# Patient Record
Sex: Male | Born: 1935 | Race: White | Hispanic: No | Marital: Married | Smoking: Former smoker
Health system: Southern US, Community
[De-identification: ages and names within clinical notes are randomized; demographics above are authoritative.]

## PROBLEM LIST (undated history)

## (undated) DIAGNOSIS — E785 Hyperlipidemia, unspecified: Secondary | ICD-10-CM

## (undated) DIAGNOSIS — J439 Emphysema, unspecified: Secondary | ICD-10-CM

## (undated) DIAGNOSIS — I7090 Unspecified atherosclerosis: Secondary | ICD-10-CM

## (undated) DIAGNOSIS — K8020 Calculus of gallbladder without cholecystitis without obstruction: Secondary | ICD-10-CM

## (undated) DIAGNOSIS — I714 Abdominal aortic aneurysm, without rupture, unspecified: Secondary | ICD-10-CM

## (undated) DIAGNOSIS — I219 Acute myocardial infarction, unspecified: Secondary | ICD-10-CM

## (undated) DIAGNOSIS — I10 Essential (primary) hypertension: Secondary | ICD-10-CM

## (undated) DIAGNOSIS — K5909 Other constipation: Secondary | ICD-10-CM

## (undated) DIAGNOSIS — I2510 Atherosclerotic heart disease of native coronary artery without angina pectoris: Secondary | ICD-10-CM

## (undated) DIAGNOSIS — E039 Hypothyroidism, unspecified: Secondary | ICD-10-CM

## (undated) DIAGNOSIS — J189 Pneumonia, unspecified organism: Secondary | ICD-10-CM

## (undated) DIAGNOSIS — N210 Calculus in bladder: Secondary | ICD-10-CM

## (undated) HISTORY — PX: TONSILLECTOMY: SUR1361

## (undated) HISTORY — DX: Abdominal aortic aneurysm, without rupture: I71.4

## (undated) HISTORY — DX: Pneumonia, unspecified organism: J18.9

## (undated) HISTORY — DX: Hyperlipidemia, unspecified: E78.5

## (undated) HISTORY — DX: Acute myocardial infarction, unspecified: I21.9

## (undated) HISTORY — DX: Abdominal aortic aneurysm, without rupture, unspecified: I71.40

## (undated) HISTORY — DX: Unspecified atherosclerosis: I70.90

## (undated) HISTORY — DX: Hypothyroidism, unspecified: E03.9

## (undated) HISTORY — DX: Essential (primary) hypertension: I10

## (undated) HISTORY — DX: Calculus in bladder: N21.0

## (undated) HISTORY — DX: Atherosclerotic heart disease of native coronary artery without angina pectoris: I25.10

## (undated) HISTORY — PX: APPENDECTOMY: SHX54

## (undated) HISTORY — DX: Other constipation: K59.09

## (undated) HISTORY — DX: Calculus of gallbladder without cholecystitis without obstruction: K80.20

---

## 1984-02-01 HISTORY — PX: CORONARY ARTERY BYPASS GRAFT: SHX141

## 2006-10-12 ENCOUNTER — Ambulatory Visit (HOSPITAL_COMMUNITY): Admission: RE | Admit: 2006-10-12 | Discharge: 2006-10-12 | Payer: Self-pay | Admitting: Ophthalmology

## 2007-01-04 ENCOUNTER — Ambulatory Visit (HOSPITAL_COMMUNITY): Admission: RE | Admit: 2007-01-04 | Discharge: 2007-01-04 | Payer: Self-pay | Admitting: Ophthalmology

## 2010-01-31 DIAGNOSIS — J189 Pneumonia, unspecified organism: Secondary | ICD-10-CM

## 2010-01-31 HISTORY — DX: Pneumonia, unspecified organism: J18.9

## 2010-07-27 LAB — BASIC METABOLIC PANEL WITH GFR
Creatinine: 1 mg/dL (ref 0.6–1.3)
Glucose: 104 mg/dL
Potassium: 4.5 mmol/L (ref 3.4–5.3)
Sodium: 140 mmol/L (ref 137–147)

## 2010-07-27 LAB — HEPATIC FUNCTION PANEL: ALT: 14 U/L (ref 10–40)

## 2010-08-31 ENCOUNTER — Encounter (INDEPENDENT_AMBULATORY_CARE_PROVIDER_SITE_OTHER): Payer: Self-pay | Admitting: *Deleted

## 2010-09-08 ENCOUNTER — Encounter (INDEPENDENT_AMBULATORY_CARE_PROVIDER_SITE_OTHER): Payer: Self-pay

## 2010-09-08 ENCOUNTER — Inpatient Hospital Stay (HOSPITAL_COMMUNITY)
Admission: EM | Admit: 2010-09-08 | Discharge: 2010-09-20 | DRG: 238 | Disposition: A | Discharge status: Home Health | Payer: Medicare Other | Attending: Vascular Surgery | Admitting: Vascular Surgery

## 2010-09-08 ENCOUNTER — Emergency Department (HOSPITAL_COMMUNITY): Payer: Medicare Other

## 2010-09-08 DIAGNOSIS — I714 Abdominal aortic aneurysm, without rupture, unspecified: Secondary | ICD-10-CM | POA: Diagnosis present

## 2010-09-08 DIAGNOSIS — E039 Hypothyroidism, unspecified: Secondary | ICD-10-CM | POA: Diagnosis present

## 2010-09-08 DIAGNOSIS — I2510 Atherosclerotic heart disease of native coronary artery without angina pectoris: Secondary | ICD-10-CM | POA: Diagnosis present

## 2010-09-08 DIAGNOSIS — I75029 Atheroembolism of unspecified lower extremity: Principal | ICD-10-CM | POA: Diagnosis present

## 2010-09-08 DIAGNOSIS — H5316 Psychophysical visual disturbances: Secondary | ICD-10-CM | POA: Diagnosis not present

## 2010-09-08 DIAGNOSIS — I743 Embolism and thrombosis of arteries of the lower extremities: Secondary | ICD-10-CM

## 2010-09-08 DIAGNOSIS — I10 Essential (primary) hypertension: Secondary | ICD-10-CM | POA: Diagnosis present

## 2010-09-08 DIAGNOSIS — E7800 Pure hypercholesterolemia, unspecified: Secondary | ICD-10-CM | POA: Diagnosis present

## 2010-09-08 DIAGNOSIS — Z7982 Long term (current) use of aspirin: Secondary | ICD-10-CM

## 2010-09-08 DIAGNOSIS — Y9210 Unspecified residential institution as the place of occurrence of the external cause: Secondary | ICD-10-CM | POA: Diagnosis not present

## 2010-09-08 DIAGNOSIS — F17200 Nicotine dependence, unspecified, uncomplicated: Secondary | ICD-10-CM | POA: Diagnosis present

## 2010-09-08 DIAGNOSIS — T40605A Adverse effect of unspecified narcotics, initial encounter: Secondary | ICD-10-CM | POA: Diagnosis not present

## 2010-09-08 DIAGNOSIS — F19921 Other psychoactive substance use, unspecified with intoxication with delirium: Secondary | ICD-10-CM | POA: Diagnosis not present

## 2010-09-08 DIAGNOSIS — I252 Old myocardial infarction: Secondary | ICD-10-CM

## 2010-09-08 DIAGNOSIS — K5900 Constipation, unspecified: Secondary | ICD-10-CM | POA: Diagnosis not present

## 2010-09-08 DIAGNOSIS — E119 Type 2 diabetes mellitus without complications: Secondary | ICD-10-CM | POA: Diagnosis present

## 2010-09-08 DIAGNOSIS — Z79899 Other long term (current) drug therapy: Secondary | ICD-10-CM

## 2010-09-08 DIAGNOSIS — Z951 Presence of aortocoronary bypass graft: Secondary | ICD-10-CM

## 2010-09-08 LAB — TYPE AND SCREEN: Antibody Screen: NEGATIVE

## 2010-09-08 LAB — ABO/RH: ABO/RH(D): A POS

## 2010-09-09 DIAGNOSIS — Z01810 Encounter for preprocedural cardiovascular examination: Secondary | ICD-10-CM

## 2010-09-09 DIAGNOSIS — I743 Embolism and thrombosis of arteries of the lower extremities: Secondary | ICD-10-CM

## 2010-09-09 DIAGNOSIS — I719 Aortic aneurysm of unspecified site, without rupture: Secondary | ICD-10-CM

## 2010-09-09 LAB — BASIC METABOLIC PANEL
Chloride: 100 mEq/L (ref 96–112)
Creatinine, Ser: 0.62 mg/dL (ref 0.50–1.35)
GFR calc Af Amer: >60 mL/min (ref >60)
Sodium: 134 mEq/L — ABNORMAL LOW (ref 135–145)

## 2010-09-09 LAB — HEPARIN LEVEL (UNFRACTIONATED)
Heparin Unfractionated: 0.17 IU/mL — ABNORMAL LOW (ref 0.30–0.70)
Heparin Unfractionated: 0.47 IU/mL (ref 0.30–0.70)

## 2010-09-09 LAB — CBC
MCH: 28.9 pg (ref 26.0–34.0)
Platelets: 238 10*3/uL (ref 150–400)
RBC: 3.98 MIL/uL — ABNORMAL LOW (ref 4.22–5.81)
WBC: 7.3 10*3/uL (ref 4.0–10.5)

## 2010-09-09 MED ORDER — IOHEXOL 350 MG/ML SOLN
100.0000 mL | Freq: Once | INTRAVENOUS | Status: AC | PRN
  Administered 2010-09-09: 100 mL via INTRAVENOUS

## 2010-09-10 ENCOUNTER — Inpatient Hospital Stay (HOSPITAL_COMMUNITY): Payer: Medicare Other

## 2010-09-10 DIAGNOSIS — I2510 Atherosclerotic heart disease of native coronary artery without angina pectoris: Secondary | ICD-10-CM

## 2010-09-10 DIAGNOSIS — I743 Embolism and thrombosis of arteries of the lower extremities: Secondary | ICD-10-CM

## 2010-09-10 LAB — CBC
Hemoglobin: 11.6 g/dL — ABNORMAL LOW (ref 13.0–17.0)
MCHC: 33.5 g/dL (ref 30.0–36.0)
RDW: 17.2 % — ABNORMAL HIGH (ref 11.5–15.5)

## 2010-09-10 LAB — HEPARIN LEVEL (UNFRACTIONATED): Heparin Unfractionated: 0.35 IU/mL (ref 0.30–0.70)

## 2010-09-10 MED ORDER — TECHNETIUM TC 99M TETROFOSMIN IV KIT
10.0000 | PACK | Freq: Once | INTRAVENOUS | Status: AC | PRN
  Administered 2010-09-10: 10 via INTRAVENOUS

## 2010-09-10 MED ORDER — TECHNETIUM TC 99M TETROFOSMIN IV KIT
30.0000 | PACK | Freq: Once | INTRAVENOUS | Status: AC | PRN
  Administered 2010-09-10: 30 via INTRAVENOUS

## 2010-09-11 DIAGNOSIS — I743 Embolism and thrombosis of arteries of the lower extremities: Secondary | ICD-10-CM

## 2010-09-11 LAB — CBC
HCT: 37.7 % — ABNORMAL LOW (ref 39.0–52.0)
Platelets: 310 10*3/uL (ref 150–400)
RDW: 17.1 % — ABNORMAL HIGH (ref 11.5–15.5)
WBC: 7.6 10*3/uL (ref 4.0–10.5)

## 2010-09-11 LAB — GLUCOSE, CAPILLARY: Glucose-Capillary: 98 mg/dL (ref 70–99)

## 2010-09-12 DIAGNOSIS — I743 Embolism and thrombosis of arteries of the lower extremities: Secondary | ICD-10-CM

## 2010-09-12 LAB — GLUCOSE, CAPILLARY
Glucose-Capillary: 120 mg/dL — ABNORMAL HIGH (ref 70–99)
Glucose-Capillary: 119 mg/dL — ABNORMAL HIGH (ref 70–99)

## 2010-09-12 LAB — HEPARIN LEVEL (UNFRACTIONATED): Heparin Unfractionated: 0.56 IU/mL (ref 0.30–0.70)

## 2010-09-12 LAB — CBC
Platelets: 296 10*3/uL (ref 150–400)
RDW: 17.1 % — ABNORMAL HIGH (ref 11.5–15.5)
WBC: 8 10*3/uL (ref 4.0–10.5)

## 2010-09-13 DIAGNOSIS — I743 Embolism and thrombosis of arteries of the lower extremities: Secondary | ICD-10-CM

## 2010-09-13 LAB — CBC
MCH: 28.7 pg (ref 26.0–34.0)
MCHC: 33.8 g/dL (ref 30.0–36.0)
Platelets: 341 10*3/uL (ref 150–400)

## 2010-09-13 LAB — HEPARIN LEVEL (UNFRACTIONATED): Heparin Unfractionated: 0.32 IU/mL (ref 0.30–0.70)

## 2010-09-13 NOTE — Consult Note (Signed)
NAME:  Dillon Sweeney, Dillon Sweeney NO.:  [id number]  MEDICAL RECORD NO.:  [id number]  LOCATION:  2021                         FACILITY:  [facility]  PHYSICIAN:  Verne Carrow, MDDATE OF BIRTH:  29-Jan-1933  DATE OF CONSULTATION:  09/09/2010 DATE OF DISCHARGE:                                CONSULTATION   PRIMARY CARDIOLOGIST:  The patient was previously seen by Everardo Beals. Juanda Chance, MD, [facility]  PRIMARY CARE PROVIDER:  Dr. Virgina Organ.  The patient also used to see Macarthur Critchley. Torelli, MD, but does not see him any longer.  PATIENT PROFILE:  A 77 year old male with prior history of CAD status post CABG in 1986, who presented to the hospital yesterday with right lower extremity pain and mottling of the right foot, who has been found to have a 4.9 x 4.9 cm AAA with large amount of thrombus.  We have been asked to evaluate for preoperative clearance.  PROBLEM LIST: 1. A 4.9-cm abdominal aortic aneurysm with large amount of  intraluminal thrombus with emboli to the feet. 2. Coronary artery disease.     a.     Status post coronary artery bypass graft x5 in 1986.     b.     Last catheterization greater than 10 years ago.  The patient      was told only 3 of 5 grafts were patent.     c.     History of normal stress test greater than 5 years ago. 3. Hypertension. 4. Hyperlipidemia. 5. Diabetes mellitus. 6. Hypothyroidism. 7. Status post appendectomy. 8. Status post tonsillectomy. 9. Ongoing tobacco abuse.  ALLERGIES:  No known drug allergies.  HISTORY OF PRESENT ILLNESS:  A 77 year old male with the above problem list.  He has a history of coronary artery disease status post CABG possibly x 5 in 1986 and his last catheterization was performed greater than 10 years ago.  He was told that only 3 grafts were found.  His last nuclear study was greater than 5 years.  The patient reports not being very active at home, sometimes walk has gone for up to 2 blocks without experiencing  chest pain or dyspnea.  He does note dyspnea on exertion with higher levels of activity.  Earlier this week, the patient awoke with bilateral foot and lower extremity pain and was [facility] with a negative workup. Yesterday, he had severe pain in the bilateral feet with mottling of the right foot, presented to the [facility] and was subsequently transferred to the [facility].  Here, a CT of his abdomen showed a 4.9- cm abdominal aortic aneurysm with large amount of intraluminal thrombus. He has been evaluated by Vascular Surgery with tentative plan for open repair.  We have been asked for cardiac clearance.  CURRENT MEDICATIONS: 1. Aspirin 81 mg daily. 2. Lotensin 40 mg daily. 3. Heparin infusion. 4. Synthroid 100 mcg daily. 5. Metformin 500 mg b.i.d. 6. Crestor 40 mg daily.  FAMILY HISTORY:  Mother died of "grief" at age 52.  Father died at 35 of "old age."  He may have had a stroke in his lifetime.  He has a sister who has cancer and she  is aged 64.  SOCIAL HISTORY:  The patient lives in [place] with his wife.  He is retired from [employer]  He has a 60-90-pack-year history of tobacco abuse, currently smokes between 1 and 1-1/2 packs a day.  He denies alcohol or drug use and does not routinely exercise.  REVIEW OF SYSTEMS:  Positive for dyspnea on exertion with higher levels of activity.  He has intermittent diarrhea and constipation and also a history of urinary hesitancy and straining.  He is a full code. Otherwise, all systems reviewed and negative.  PHYSICAL EXAMINATION:  VITAL SIGNS:  Temperature 97.5, heart rate 75, respirations 18, blood pressure 122/60, pulse ox 93% on room air, weight is 82.5 kilos. GENERAL:  Pleasant white male, in no acute distress.  Awake, alert, and oriented x3.  He has a normal affect. HEENT:  Normal. NEUROLOGIC:  Grossly intact.  Nonfocal. SKIN:  Warm and dry with some mottling of the right ankle and  foot as well as toes with erythema and trace right lower extremity edema.  There is also a non-raised rash over the right ankle and foot as well as under the plantar aspect of the left great toe. NECK:  Supple without bruits or JVD. LUNGS:  Respirations are regular and unlabored.  Diminished breath sounds bilaterally. CARDIAC:  Regular, distant S1 and S2.  No S3, S4, or murmurs. ABDOMEN:  Round, soft, nontender, nondistended.  Bowel sounds present x4. EXTREMITIES:  Warm, dry, pink.  No clubbing.  Trace edema as above. Dorsalis pedis +2, tibial pulses 1+ and equal bilaterally.  EKG shows sinus rhythm, rate of 68, right bundle-branch block which is new since 2008.  Hemoglobin 11.5, hematocrit 33.7, WBC 7.2, platelets 238.  Sodium 134, potassium 3.7, chloride 100, CO2 23, BUN 9, creatinine 0.62, glucose 96, calcium 8.8.  ASSESSMENT AND PLAN: 1. Abdominal aortic aneurysm with embolic event to the right lower     extremity.  The patient is pending open repair.  From a cardiac     standpoint, he will need Lexiscan Myoview preoperatively for risk     stratification. 2. Coronary artery disease.  As above, we will plan for Myoview     tomorrow.  No chest pain at home.  Usually, no shortness of breath     but generally lower activity levels.  Continue aspirin, statin, and     beta-blocker.  Further recommendations following Myoview. 3. Ongoing tobacco abuse, smoking cessation strongly advised. 4. Right lower extremity embolic event.  The patient remains on     heparin therapy and is pending Vascular Surgery as above.  Thank you for following this patient.     Nicolasa Ducking, ANP   ______________________________ Verne Carrow, MD    CB/MEDQ  D:  09/09/2010  T:  09/09/2010  Job:  [id number]  Electronically Signed by Nicolasa Ducking ANP on 09/10/2010 03:39:53 PM Electronically Signed by Verne Carrow MD on 09/13/2010 10:13:19 AM

## 2010-09-14 LAB — CBC
MCH: 27.9 pg (ref 26.0–34.0)
MCHC: 33 g/dL (ref 30.0–36.0)
Platelets: 370 10*3/uL (ref 150–400)
RBC: 4.33 MIL/uL (ref 4.22–5.81)

## 2010-09-14 LAB — GLUCOSE, CAPILLARY: Glucose-Capillary: 119 mg/dL — ABNORMAL HIGH (ref 70–99)

## 2010-09-14 LAB — HEPARIN LEVEL (UNFRACTIONATED): Heparin Unfractionated: 0.68 IU/mL (ref 0.30–0.70)

## 2010-09-15 ENCOUNTER — Inpatient Hospital Stay (HOSPITAL_COMMUNITY): Payer: Medicare Other

## 2010-09-15 ENCOUNTER — Other Ambulatory Visit: Payer: Self-pay | Admitting: Vascular Surgery

## 2010-09-15 DIAGNOSIS — I714 Abdominal aortic aneurysm, without rupture: Secondary | ICD-10-CM

## 2010-09-15 HISTORY — PX: ABDOMINAL AORTIC ANEURYSM REPAIR: SUR1152

## 2010-09-15 LAB — CBC
HCT: 38 % — ABNORMAL LOW (ref 39.0–52.0)
HCT: 32.7 % — ABNORMAL LOW (ref 39.0–52.0)
Hemoglobin: 12.7 g/dL — ABNORMAL LOW (ref 13.0–17.0)
MCV: 85.4 fL (ref 78.0–100.0)
RBC: 4.45 MIL/uL (ref 4.22–5.81)
RDW: 17.1 % — ABNORMAL HIGH (ref 11.5–15.5)
WBC: 9.3 10*3/uL (ref 4.0–10.5)
WBC: 18.6 10*3/uL — ABNORMAL HIGH (ref 4.0–10.5)

## 2010-09-15 LAB — BLOOD GAS, ARTERIAL
Bicarbonate: 18.9 mEq/L — ABNORMAL LOW (ref 20.0–24.0)
Patient temperature: 97.6
pH, Arterial: 7.343 — ABNORMAL LOW (ref 7.350–7.450)
pO2, Arterial: 66.8 mmHg — ABNORMAL LOW (ref 80.0–100.0)

## 2010-09-15 LAB — BASIC METABOLIC PANEL
BUN: 12 mg/dL (ref 6–23)
CO2: 28 mEq/L (ref 19–32)
Calcium: 7.5 mg/dL — ABNORMAL LOW (ref 8.4–10.5)
Chloride: 101 mEq/L (ref 96–112)
Chloride: 106 mEq/L (ref 96–112)
Creatinine, Ser: 0.93 mg/dL (ref 0.50–1.35)
GFR calc Af Amer: >60 mL/min (ref >60)
GFR calc Af Amer: >60 mL/min (ref >60)
Glucose, Bld: 110 mg/dL — ABNORMAL HIGH (ref 70–99)
Potassium: 4 mEq/L (ref 3.5–5.1)

## 2010-09-15 LAB — PROTIME-INR: INR: 1.35 (ref 0.00–1.49)

## 2010-09-15 LAB — GLUCOSE, CAPILLARY: Glucose-Capillary: 177 mg/dL — ABNORMAL HIGH (ref 70–99)

## 2010-09-15 LAB — APTT: aPTT: 35 seconds (ref 24–37)

## 2010-09-16 ENCOUNTER — Inpatient Hospital Stay (HOSPITAL_COMMUNITY): Payer: Medicare Other

## 2010-09-16 LAB — COMPREHENSIVE METABOLIC PANEL
AST: 20 U/L (ref 0–37)
CO2: 21 mEq/L (ref 19–32)
Calcium: 7.7 mg/dL — ABNORMAL LOW (ref 8.4–10.5)
Creatinine, Ser: 1.22 mg/dL (ref 0.50–1.35)
GFR calc Af Amer: >60 mL/min (ref >60)
GFR calc non Af Amer: 58 mL/min — ABNORMAL LOW (ref >60)
Glucose, Bld: 120 mg/dL — ABNORMAL HIGH (ref 70–99)
Total Protein: 4.8 g/dL — ABNORMAL LOW (ref 6.0–8.3)

## 2010-09-16 LAB — POCT I-STAT 7, (LYTES, BLD GAS, ICA,H+H)
Acid-base deficit: 1 mmol/L (ref 0.0–2.0)
Bicarbonate: 24.2 mEq/L — ABNORMAL HIGH (ref 20.0–24.0)
Bicarbonate: 20.6 mEq/L (ref 20.0–24.0)
Calcium, Ion: 1.18 mmol/L (ref 1.12–1.32)
Calcium, Ion: 1.08 mmol/L — ABNORMAL LOW (ref 1.12–1.32)
HCT: 27 % — ABNORMAL LOW (ref 39.0–52.0)
Hemoglobin: 9.2 g/dL — ABNORMAL LOW (ref 13.0–17.0)
O2 Saturation: 100 %
Patient temperature: 35.5
Patient temperature: 35.8
TCO2: 25 mmol/L (ref 0–100)
pCO2 arterial: 39.1 mmHg (ref 35.0–45.0)
pH, Arterial: 7.324 — ABNORMAL LOW (ref 7.350–7.450)
pO2, Arterial: 471 mmHg — ABNORMAL HIGH (ref 80.0–100.0)
pO2, Arterial: 419 mmHg — ABNORMAL HIGH (ref 80.0–100.0)

## 2010-09-16 LAB — CBC
HCT: 32.3 % — ABNORMAL LOW (ref 39.0–52.0)
Hemoglobin: 10.9 g/dL — ABNORMAL LOW (ref 13.0–17.0)
MCH: 28.7 pg (ref 26.0–34.0)
MCHC: 33.7 g/dL (ref 30.0–36.0)
MCV: 85 fL (ref 78.0–100.0)

## 2010-09-16 LAB — GLUCOSE, CAPILLARY
Glucose-Capillary: 144 mg/dL — ABNORMAL HIGH (ref 70–99)
Glucose-Capillary: 169 mg/dL — ABNORMAL HIGH (ref 70–99)

## 2010-09-17 LAB — BASIC METABOLIC PANEL
BUN: 18 mg/dL (ref 6–23)
Calcium: 8 mg/dL — ABNORMAL LOW (ref 8.4–10.5)
GFR calc Af Amer: >60 mL/min (ref >60)
GFR calc non Af Amer: 50 mL/min — ABNORMAL LOW (ref >60)
Glucose, Bld: 127 mg/dL — ABNORMAL HIGH (ref 70–99)
Potassium: 3.7 mEq/L (ref 3.5–5.1)
Sodium: 140 mEq/L (ref 135–145)

## 2010-09-17 LAB — URINALYSIS, ROUTINE W REFLEX MICROSCOPIC
Bilirubin Urine: NEGATIVE
Ketones, ur: 15 mg/dL — AB
Nitrite: NEGATIVE
Specific Gravity, Urine: 1.013 (ref 1.005–1.030)
pH: 7 (ref 5.0–8.0)

## 2010-09-17 LAB — GLUCOSE, CAPILLARY
Glucose-Capillary: 116 mg/dL — ABNORMAL HIGH (ref 70–99)
Glucose-Capillary: 122 mg/dL — ABNORMAL HIGH (ref 70–99)
Glucose-Capillary: 125 mg/dL — ABNORMAL HIGH (ref 70–99)
Glucose-Capillary: 128 mg/dL — ABNORMAL HIGH (ref 70–99)

## 2010-09-17 LAB — CBC
HCT: 26.3 % — ABNORMAL LOW (ref 39.0–52.0)
Hemoglobin: 8.7 g/dL — ABNORMAL LOW (ref 13.0–17.0)
MCH: 28.7 pg (ref 26.0–34.0)
MCHC: 33.8 g/dL (ref 30.0–36.0)
RDW: 17.7 % — ABNORMAL HIGH (ref 11.5–15.5)

## 2010-09-17 LAB — URINE MICROSCOPIC-ADD ON

## 2010-09-18 ENCOUNTER — Inpatient Hospital Stay (HOSPITAL_COMMUNITY): Payer: Medicare Other

## 2010-09-18 LAB — CROSSMATCH: ABO/RH(D): A POS

## 2010-09-18 LAB — CBC
HCT: 26.4 % — ABNORMAL LOW (ref 39.0–52.0)
Hemoglobin: 8.7 g/dL — ABNORMAL LOW (ref 13.0–17.0)
MCH: 28.3 pg (ref 26.0–34.0)
MCHC: 33 g/dL (ref 30.0–36.0)
MCV: 86 fL (ref 78.0–100.0)
RBC: 3.07 MIL/uL — ABNORMAL LOW (ref 4.22–5.81)

## 2010-09-18 LAB — BASIC METABOLIC PANEL
BUN: 20 mg/dL (ref 6–23)
CO2: 18 mEq/L — ABNORMAL LOW (ref 19–32)
Calcium: 8.4 mg/dL (ref 8.4–10.5)
Creatinine, Ser: 1.09 mg/dL (ref 0.50–1.35)
GFR calc non Af Amer: >60 mL/min (ref >60)
Glucose, Bld: 111 mg/dL — ABNORMAL HIGH (ref 70–99)
Sodium: 142 mEq/L (ref 135–145)

## 2010-09-18 LAB — GLUCOSE, CAPILLARY
Glucose-Capillary: 115 mg/dL — ABNORMAL HIGH (ref 70–99)
Glucose-Capillary: 160 mg/dL — ABNORMAL HIGH (ref 70–99)

## 2010-09-18 LAB — URINE CULTURE: Culture: NO GROWTH

## 2010-09-19 ENCOUNTER — Inpatient Hospital Stay (HOSPITAL_COMMUNITY): Payer: Medicare Other

## 2010-09-19 LAB — GLUCOSE, CAPILLARY
Glucose-Capillary: 103 mg/dL — ABNORMAL HIGH (ref 70–99)
Glucose-Capillary: 108 mg/dL — ABNORMAL HIGH (ref 70–99)
Glucose-Capillary: 109 mg/dL — ABNORMAL HIGH (ref 70–99)
Glucose-Capillary: 122 mg/dL — ABNORMAL HIGH (ref 70–99)
Glucose-Capillary: 135 mg/dL — ABNORMAL HIGH (ref 70–99)
Glucose-Capillary: 149 mg/dL — ABNORMAL HIGH (ref 70–99)

## 2010-09-19 LAB — BASIC METABOLIC PANEL WITH GFR
BUN: 17 mg/dL (ref 6–23)
CO2: 23 meq/L (ref 19–32)
Calcium: 8.4 mg/dL (ref 8.4–10.5)
Chloride: 109 meq/L (ref 96–112)
Creatinine, Ser: 0.92 mg/dL (ref 0.50–1.35)
GFR calc Af Amer: >60 mL/min
GFR calc non Af Amer: >60 mL/min
Glucose, Bld: 126 mg/dL — ABNORMAL HIGH (ref 70–99)
Potassium: 3 meq/L — ABNORMAL LOW (ref 3.5–5.1)
Sodium: 141 meq/L (ref 135–145)

## 2010-09-19 LAB — CBC
HCT: 27 % — ABNORMAL LOW (ref 39.0–52.0)
Hemoglobin: 9 g/dL — ABNORMAL LOW (ref 13.0–17.0)
MCH: 28.7 pg (ref 26.0–34.0)
MCHC: 33.3 g/dL (ref 30.0–36.0)
MCV: 86 fL (ref 78.0–100.0)
Platelets: 228 10*3/uL (ref 150–400)
RBC: 3.14 MIL/uL — ABNORMAL LOW (ref 4.22–5.81)
RDW: 17.6 % — ABNORMAL HIGH (ref 11.5–15.5)
WBC: 10 10*3/uL (ref 4.0–10.5)

## 2010-09-20 LAB — GLUCOSE, CAPILLARY

## 2010-09-22 ENCOUNTER — Encounter: Payer: Self-pay | Admitting: Vascular Surgery

## 2010-09-22 ENCOUNTER — Inpatient Hospital Stay (HOSPITAL_COMMUNITY): Payer: Medicare Other

## 2010-09-22 ENCOUNTER — Inpatient Hospital Stay (HOSPITAL_COMMUNITY)
Admission: AD | Admit: 2010-09-22 | Discharge: 2010-09-25 | DRG: 391 | Disposition: A | Discharge status: Home / Self Care | Payer: Medicare Other | Source: Ambulatory Visit | Attending: Vascular Surgery | Admitting: Vascular Surgery

## 2010-09-22 ENCOUNTER — Ambulatory Visit (INDEPENDENT_AMBULATORY_CARE_PROVIDER_SITE_OTHER): Payer: Medicare Other | Admitting: Vascular Surgery

## 2010-09-22 ENCOUNTER — Encounter: Payer: Self-pay | Admitting: *Deleted

## 2010-09-22 VITALS — BP 142/72 | HR 81 | Temp 98.1°F | Ht 70.0 in | Wt 181.2 lb

## 2010-09-22 DIAGNOSIS — E119 Type 2 diabetes mellitus without complications: Secondary | ICD-10-CM | POA: Diagnosis present

## 2010-09-22 DIAGNOSIS — I2510 Atherosclerotic heart disease of native coronary artery without angina pectoris: Secondary | ICD-10-CM | POA: Diagnosis present

## 2010-09-22 DIAGNOSIS — I219 Acute myocardial infarction, unspecified: Secondary | ICD-10-CM

## 2010-09-22 DIAGNOSIS — Z9889 Other specified postprocedural states: Secondary | ICD-10-CM

## 2010-09-22 DIAGNOSIS — E860 Dehydration: Secondary | ICD-10-CM | POA: Diagnosis present

## 2010-09-22 DIAGNOSIS — D649 Anemia, unspecified: Secondary | ICD-10-CM | POA: Diagnosis present

## 2010-09-22 DIAGNOSIS — E876 Hypokalemia: Secondary | ICD-10-CM | POA: Diagnosis present

## 2010-09-22 DIAGNOSIS — Z951 Presence of aortocoronary bypass graft: Secondary | ICD-10-CM

## 2010-09-22 DIAGNOSIS — R112 Nausea with vomiting, unspecified: Principal | ICD-10-CM | POA: Diagnosis present

## 2010-09-22 DIAGNOSIS — J189 Pneumonia, unspecified organism: Secondary | ICD-10-CM | POA: Diagnosis present

## 2010-09-22 HISTORY — DX: Acute myocardial infarction, unspecified: I21.9

## 2010-09-22 LAB — URINE MICROSCOPIC-ADD ON

## 2010-09-22 LAB — URINALYSIS, ROUTINE W REFLEX MICROSCOPIC
Hgb urine dipstick: NEGATIVE
Ketones, ur: 40 mg/dL — AB
Specific Gravity, Urine: 1.022 (ref 1.005–1.030)
Urobilinogen, UA: 1 mg/dL (ref 0.0–1.0)
pH: 6 (ref 5.0–8.0)

## 2010-09-22 LAB — CBC
HCT: 27.1 % — ABNORMAL LOW (ref 39.0–52.0)
Hemoglobin: 9.1 g/dL — ABNORMAL LOW (ref 13.0–17.0)
MCH: 28.6 pg (ref 26.0–34.0)
MCHC: 33.6 g/dL (ref 30.0–36.0)
MCV: 85.2 fL (ref 78.0–100.0)
RBC: 3.18 MIL/uL — ABNORMAL LOW (ref 4.22–5.81)

## 2010-09-22 LAB — BASIC METABOLIC PANEL
BUN: 13 mg/dL (ref 6–23)
CO2: 26 mEq/L (ref 19–32)
Chloride: 102 mEq/L (ref 96–112)
Creatinine, Ser: 0.89 mg/dL (ref 0.50–1.35)
GFR calc Af Amer: >60 mL/min (ref >60)
Glucose, Bld: 122 mg/dL — ABNORMAL HIGH (ref 70–99)
Potassium: 2.6 mEq/L — CL (ref 3.5–5.1)

## 2010-09-22 LAB — GLUCOSE, CAPILLARY: Glucose-Capillary: 126 mg/dL — ABNORMAL HIGH (ref 70–99)

## 2010-09-22 NOTE — Progress Notes (Signed)
Subjective:     Patient ID: Dillon Sweeney, male   DOB: 1932-12-28, 77 y.o.   MRN: [id number]  HPI  This is a 77 year old gentleman who underwent repair of a juxtarenal abdominal aortic aneurysm by Dr. Darrick Penna on 09/15/2010. He was discharged on 09/20/2010. At 1 AM this morning he developed the onset of nausea and vomiting. His wife states that he has thrown up 6 times since this morning. He has also had some diarrhea. His had some mild abdominal pain. He denies fever or chills. His had no chest pain or chest pressure.  Past Medical History  Diagnosis Date  . Chronic constipation   . Hypertension   . Dyslipidemia   . Hypothyroidism   . Diabetes mellitus     Type 2  . Hyperlipidemia   . CAD (coronary artery disease)   . AAA (abdominal aortic aneurysm)   . Myocardial infarction 09/22/10    1986    Family History  Problem Relation Age of Onset  . Stroke Father     History  Substance Use Topics  . Smoking status: Former Smoker -- 1.0 packs/day    Types: Cigarettes    Quit date: 09/10/2010  . Smokeless tobacco: Not on file  . Alcohol Use: Yes     occasionally    No Known Allergies  Current Outpatient Prescriptions  Medication Sig Dispense Refill  . Acetaminophen (TYLENOL PO) Take 1-2 tablets by mouth every 6 (six) hours as needed.        Marland Kitchen amLODipine (NORVASC) 5 MG tablet Take 5 mg by mouth daily.        Marland Kitchen aspirin 81 MG tablet Take 81 mg by mouth daily.        . benazepril (LOTENSIN) 40 MG tablet Take 40 mg by mouth daily.        . calcium-vitamin D (OSCAL) 250-125 MG-UNIT per tablet Take 1 tablet by mouth daily.        . famotidine (PEPCID) 20 MG tablet Take 20 mg by mouth 2 (two) times daily.        Marland Kitchen levothyroxine (SYNTHROID, LEVOTHROID) 100 MCG tablet Take 100 mcg by mouth daily.        . metFORMIN (GLUMETZA) 500 MG (MOD) 24 hr tablet Take 500 mg by mouth daily with breakfast.        . metoprolol (TOPROL-XL) 50 MG 24 hr tablet Take 50 mg by mouth 2 (two) times daily.         . multivitamin (THERAGRAN) per tablet Take 1 tablet by mouth daily.        . rosuvastatin (CRESTOR) 20 MG tablet Take 20 mg by mouth daily.        Marland Kitchen oxyCODONE-acetaminophen (PERCOCET) 10-325 MG per tablet Take 1 tablet by mouth every 4 (four) hours as needed.          Review of Systems  Constitutional: Positive for appetite change. Negative for fever, chills and unexpected weight change.  Respiratory: Negative for cough, chest tightness, shortness of breath and wheezing.   Cardiovascular: Negative for chest pain, palpitations and leg swelling.  Gastrointestinal: Positive for nausea, vomiting, abdominal pain and diarrhea. Negative for constipation.  Genitourinary: Negative for dysuria, frequency and hematuria.  Musculoskeletal: Positive for arthralgias. Negative for myalgias.  Skin: Negative for rash and wound.  Neurological: Negative for dizziness, seizures, speech difficulty, weakness, numbness and headaches.  Hematological: Does not bruise/bleed easily.  Psychiatric/Behavioral: Negative for confusion.       Objective:   Physical  Exam  Constitutional: He is oriented to person, place, and time. He appears well-developed and well-nourished.  HENT:  Head: Normocephalic and atraumatic.  Neck: Neck supple. No JVD present. No thyromegaly present.  Cardiovascular: Normal rate, regular rhythm and normal heart sounds.  Exam reveals no friction rub.   No murmur heard. Pulmonary/Chest: Breath sounds normal. He has no wheezes. He has no rales.  Abdominal: Soft. He exhibits no distension and no mass. There is no tenderness. There is no rebound and no guarding.       He has hypoactive bowel sounds. His incision is healing nicely.  Musculoskeletal: He exhibits no edema.  Lymphadenopathy:    He has no cervical adenopathy.  Neurological: He is alert and oriented to person, place, and time.  Skin: No rash noted.  Psychiatric: He has a normal mood and affect.   Filed Vitals:   09/22/10 1432    BP: 142/72  Pulse: 81  Temp: 98.1 F (36.7 C)    Body mass index is 26.00 kg/(m^2).        Assessment:    This patient presents with nausea and vomiting now 7 days status post abdominal aortic aneurysm repair. Also has diarrhea. He appears quite weak I think he needs to be admitted for hydration and possibly placement of an NG tube. Differential diagnosis would include an ileus versus a partial small bowel obstruction. In addition he could have C. difficile.    Plan:     He will be admitted and made n.p.o. If he continues to have nausea and vomiting he will require an NG tube. We'll obtain a C. difficile and routine lab studies. We'll also obtain chest x-ray and EKG. She'll be maintained on his routine medications.

## 2010-09-23 LAB — BASIC METABOLIC PANEL
BUN: 14 mg/dL (ref 6–23)
Creatinine, Ser: 0.9 mg/dL (ref 0.50–1.35)
GFR calc Af Amer: >60 mL/min (ref >60)
GFR calc non Af Amer: >60 mL/min (ref >60)

## 2010-09-24 LAB — CLOSTRIDIUM DIFFICILE BY PCR: Toxigenic C. Difficile by PCR: NEGATIVE

## 2010-09-24 LAB — GLUCOSE, CAPILLARY: Glucose-Capillary: 142 mg/dL — ABNORMAL HIGH (ref 70–99)

## 2010-09-24 LAB — CBC
HCT: 23.1 % — ABNORMAL LOW (ref 39.0–52.0)
Hemoglobin: 7.7 g/dL — ABNORMAL LOW (ref 13.0–17.0)
RBC: 2.7 MIL/uL — ABNORMAL LOW (ref 4.22–5.81)
WBC: 11.4 10*3/uL — ABNORMAL HIGH (ref 4.0–10.5)

## 2010-09-24 LAB — EXPECTORATED SPUTUM ASSESSMENT W GRAM STAIN, RFLX TO RESP C

## 2010-09-25 LAB — CBC
MCH: 28.4 pg (ref 26.0–34.0)
Platelets: 370 10*3/uL (ref 150–400)
RBC: 2.75 MIL/uL — ABNORMAL LOW (ref 4.22–5.81)
WBC: 9.5 10*3/uL (ref 4.0–10.5)

## 2010-09-25 LAB — BASIC METABOLIC PANEL
CO2: 26 mEq/L (ref 19–32)
Calcium: 7.9 mg/dL — ABNORMAL LOW (ref 8.4–10.5)
Sodium: 134 mEq/L — ABNORMAL LOW (ref 135–145)

## 2010-09-25 LAB — TYPE AND SCREEN
ABO/RH(D): A POS
Antibody Screen: NEGATIVE

## 2010-09-26 LAB — CULTURE, RESPIRATORY W GRAM STAIN

## 2010-09-26 LAB — URINE CULTURE: Culture  Setup Time: 201208231200

## 2010-09-27 NOTE — Discharge Summary (Addendum)
NAMEMarland Kitchen  Dillon Sweeney, Dillon Sweeney NO.:  [id number]  MEDICAL RECORD NO.:  [id number]  LOCATION:  2032                         FACILITY:  [facility]  PHYSICIAN:  Dillon Hora. Fields, MD  DATE OF BIRTH:  02/22/1932  DATE OF ADMISSION:  09/08/2010 DATE OF DISCHARGE:  09/20/2010                              DISCHARGE SUMMARY   CHIEF COMPLAINT:  Right foot pain.  HISTORY OF PRESENT ILLNESS:  Dillon Sweeney is a 77 year old gentleman who had a sudden onset of abdominal pain approximately 36 hours prior to admission.  This was associated with bilateral lower extremity weakness. The abdominal pain resolved.  On the day of admission, he began to develop duskiness and pain over the right foot, first through third toes.  He was seen in [facility] ER and referred for further evaluation. He had been taking some Vicodin at home and the patient's wife and son stated that he recently had some confusion while he was on this medication.  A recent head CT which was negative for stroke.  He had no obvious focal neuro events.  The abdominal pain was completely resolved at the time of admission.  He had no history of abdominal aortic aneurysm.  He had no history of claudication.  He has no history of atrial fibrillation.  He is admitted to the hospital for further workup where a juxtarenal abdominal aortic aneurysm was seen.  His medical doctor is Dr. Virgina Organ.  PAST MEDICAL HISTORY: 1. Type 2 diabetes. 2. Hypertension. 3. Elevated cholesterol. 4. Coronary artery disease. 5. Hypothyroidism. 6. He also had coronary artery bypass grafting in 1986 with left leg     for vein harvest. 7. He had appendectomy. 8. He had tonsillectomy.  HOSPITAL COURSE:  The patient was admitted to the hospital and started on heparin as it was felt he had an atheroembolic event to the right foot.  He had CT angio of the abdomen and pelvis, which showed a 4.9-cm juxtarenal abdominal aortic aneurysm most likely source of  the atherosclerotic emboli to the foot.  This was going to require open repair.  He was seen and cleared by [facility] for surgery. Myoview scan showed an ejection fraction of 50% and it was felt that we could proceed with surgery.  He was taken to the operating room on September 15, 2010 for an open repair of juxtarenal abdominal aortic aneurysm.  Postoperatively, the patient was extubated.  His nasogastric tube was kinked and this was pulled out and a new one was reinserted. He remained stable.  His hemoglobin and hematocrit were 10.9 and 32.3. He had a slight bump on the first postoperative day of his creatinine to 1.22; however, this resolved back to his normal over the next several days.  His NG tube was pulled on September 18, 2010 and he was started on liquid diet.  He was transferred to the floor.  He had had some confusion secondary to some of the IV pain meds and this was changed and his confusion resolved somewhat.  He still was mildly confused stating that he had tubes growing out of him; however, he was very easily reoriented.  His wounds were healing well.  He had palpable pulses in both his feet.  He had resolution of the rest pain in the right foot, which was now warm and pink with the ischemic area showing good perfusion in the first through third toes.  He will be discharged to home and have home health RN, PT and OT.  He will follow up in our office in 2 weeks for staple removal and with Dr. Darrick Penna in 4 weeks.  DISCHARGE MEDICATIONS:  Include: 1. Pepcid 20 mg twice daily. 2. Percocet 1-2 tablets every 4 hours as needed for severe pain. 3. Tylenol 1-2 tablets every 6 hours for mild pain. 4. Amlodipine 5 mg daily. 5. Aspirin 81 mg daily. 6. Benazepril 40 mg daily. 7. Crestor 40 mg daily. 8. Levothroid 100 mcg daily. 9. Metformin 500 mg daily as needed for high blood sugars. 10.Metoprolol 50 mg twice daily. 11.Multivitamins daily. 12.Vitamin D daily.  FINAL  DIAGNOSES: 1. Juxtarenal abdominal aortic aneurysm with atheroembolic ischemic     versus keratose in the right foot, which resolved post surgery. 2. Mild postop confusion, probably secondary to narcotics.  This was     resolving.  All of his other medical problems were stable while in-     house and well controlled on his p.o. medications.  DISPOSITION:  As above.  He will follow up with Dr. Darrick Penna in approximately 4 weeks.  He will be seen in the office in 2 weeks to have staples removed.     Dillon Goo, PA-C   ______________________________ Dillon Hora Fields, MD    RR/MEDQ  D:  09/20/2010  T:  09/21/2010  Job:  [id number]  Electronically Signed by Dillon Goo PA on 09/27/2010 10:22:40 AM Electronically Signed by Fabienne Bruns MD on 09/30/2010 19:14:78 PM

## 2010-09-27 NOTE — Discharge Summary (Addendum)
NAMEMarland Kitchen  Dillon Sweeney, Dillon Sweeney NO.:  [id number]  MEDICAL RECORD NO.:  [id number]  LOCATION:  2005                         FACILITY:  [facility]  PHYSICIAN:  Janetta Hora. Fields, MD  DATE OF BIRTH:  [birth year]  DATE OF ADMISSION:  09/22/2010 DATE OF DISCHARGE:  09/25/2010                              DISCHARGE SUMMARY   HISTORY OF PRESENT ILLNESS:  Mr. Laswell is a 77 year old gentleman who underwent an open repair of a juxtarenal abdominal aortic aneurysm by Dr. Darrick Penna on September 15, 2010.  He was doing well and sent home on September 20, 2010, however, on the evening before admission he developed some abdominal discomfort and bloating and he vomited several times and he came to the emergency room and was admitted to the hospital.  He also had one episode of diarrhea but was not cramping in nature.  Otherwise, he did not have real severe abdominal pain.  Once he had vomited several times he felt much better.  His abdomen was soft.  On admission, his potassium was 2.6 and this was replaced and he was admitted to the floor for hydration and further workup.  PAST MEDICAL HISTORY:  Significant for: 1. Type 2 diabetes. 2. Hypertension. 3. Elevated cholesterol. 4. Coronary artery disease. 5. Hypothyroidism. 6. Coronary artery bypass grafting in 1986. 7. Status post open repair of abdominal aortic aneurysm.  The patient was hydrated overnight.  He was given Zofran.  He was started on Reglan and he had no further episodes of vomiting.  He was voiding well.  His abdomen was flat.  His KUB was negative.  His C. Diff was negative.  A chest x-ray had showed some bibasilar minimal pneumonia and white count was 13 although he was afebrile.  Sputum culture did show some gram-negative rods and will be started on some Avelox.  Abdominal films showed no ileus pattern and he had normal gas pattern throughout the bowel.  The patient continued to do well.  His diet was advanced from clears to a  soft.  His potassium continued to be low in the low 3 range.  His H and H was stable at 8 and 23, and he was begun on some iron.  He was ambulating, voiding and taking p.o.  His vital signs were stable.  His blood pressure was stable and otherwise he had no other difficulties.  His abdomen remained soft.  His wound was healing well.  He had normoactive bowel sounds.  His white count came down to 9.5 on the Avelox, his creatinine was 0.84, his BUN was 6, so his H and H may be secondary to increased IV fluids over the last several days and he is asymptomatic from his anemia.  FINAL DIAGNOSES: 1. Nausea, vomiting, dehydration, status post abdominal aortic     aneurysm. 2. Postop anemia stable. 3. Bibasilar pneumonia with gram-negative rods growing in the sputum     treating with Avelox and the white count down to 9.5.  He remained     afebrile. 4. Status post abdominal aortic aneurysm, no ileus was noted and the     patient had resolution of nausea and vomiting.  DISPOSITION:  The patient will be discharged to home.  He will follow up next week in our office for staple removal and then in a month with Dr. Darrick Penna.  DISCHARGE MEDICATIONS: 1. Iron tablets ferrous sulfate 325 mg three times a day for a month. 2. Reglan 10 mg 1 tablet before meals and at bedtime for 14 days. 3. Avelox 400 mg daily for 10 days. 4. MiraLax 17 g daily as needed for constipation. 5. Amlodipine 5 mg daily. 6. Aspirin 81 mg daily. 7. Benazepril 40 mg daily. 8. Crestor 40 mg daily. 9. Levothyroxine 100 mcg daily. 10.Metformin 500 mg daily. 11.Metoprolol, he takes half of 100 mg daily. 12.Multivitamins daily. 13.Extra Strength Tylenol as needed for pain every 4 hours. 14.Vitamin D daily. 15.The patient will also increase his dietary intake of potassium type     foods.     Della Goo, PA-C   ______________________________ Janetta Hora Fields, MD    RR/MEDQ  D:  09/25/2010  T:  09/25/2010  Job:   [id number]  Electronically Signed by Della Goo PA on 09/27/2010 10:22:45 AM Electronically Signed by Fabienne Bruns MD on 09/30/2010 06:29:09 PM

## 2010-09-29 ENCOUNTER — Encounter: Payer: Self-pay | Admitting: Vascular Surgery

## 2010-09-30 ENCOUNTER — Encounter: Payer: Self-pay | Admitting: Vascular Surgery

## 2010-09-30 ENCOUNTER — Ambulatory Visit (INDEPENDENT_AMBULATORY_CARE_PROVIDER_SITE_OTHER): Payer: Medicare Other | Admitting: Vascular Surgery

## 2010-09-30 VITALS — BP 145/74 | HR 59 | Temp 98.3°F | Wt 169.5 lb

## 2010-09-30 DIAGNOSIS — I714 Abdominal aortic aneurysm, without rupture, unspecified: Secondary | ICD-10-CM

## 2010-09-30 NOTE — Progress Notes (Signed)
Pt S/P AAA repair for atheroemboli to right foot.  Operation was August 15.  He was readmitted for post op ileus.  He now reports he is eating well and having normal bowel movements.  His strength is returning.  PE; BP 145/74  Pulse 59  Temp 98.3 F (36.8 C)  Wt 169 lb 8 oz (76.885 kg)  Abdomen- healing midline incision no drainage  Extremities-dark discoloration right 3rd toe and plantar aspect 5th metatarsal, non tender  Assessment:  Doing well s/p repair AAA  Follow up 1 year with CTA abdomen pelvis

## 2010-09-30 NOTE — H&P (Signed)
NAMEMarland Kitchen  QUAVIS, KLUTZ NO.:  [id number]  MEDICAL RECORD NO.:  [id number]  LOCATION:  2021                         FACILITY:  [facility]  PHYSICIAN:  Janetta Hora. Fields, MD  DATE OF BIRTH:  [birth year]  DATE OF ADMISSION:  09/08/2010 DATE OF DISCHARGE:                             HISTORY & PHYSICAL   CHIEF COMPLAINT:  Right foot pain.  HISTORY OF PRESENT ILLNESS:  The patient is a 77 year old male who had sudden onset of abdominal pain approximately 36 hours ago.  This was associated with bilateral lower extremity weakness.  The abdominal pain subsequently resolved.  He was seen by Dr. Virgina Organ earlier this week and then in the [facility] ER yesterday and again today.  Further workup initially was negative.  However, approximately 10 a.m. today, he began to develop duskiness and pain over his right foot.  He was seen in the [facility] ER again this evening and referred here for further evaluation. The patient's wife and son also state that he has had some confusion recently.  They think this may be related to some Vicodin that he had been taking for foot pain.  He had a recent head CT which was negative. He has had no obvious focal neuro events.  His abdominal pain is completely resolved at this point.  He has no family history of abdominal aortic aneurysm.  He has no real prior history of claudication, although he is not very active overall.  He mainly shuffles around the house and does some small housework jobs.  He has no history of atrial fibrillation.  Chronic medical problems include diabetes, hypertension, elevated cholesterol, coronary artery disease, and hypothyroidism.  These were all followed by Dr. Virgina Organ and are currently stable.  PAST SURGICAL HISTORY:  Coronary artery bypass grafting in 1986 using left leg for vein harvest, appendectomy, tonsillectomy.  PAST MEDICAL HISTORY:  As listed above.  MEDICATIONS: 1. Metoprolol 100 mg half tablet twice a  day. 2. Crestor 40 mg once a day. 3. Levothyroxine 100 mcg once a day. 4. Metformin 500 mg p.o. once a day. 5. Benazepril 40 mg once a day. 6. Aspirin 81 mg once a day.  He has no known drug allergies.  REVIEW OF SYSTEMS:  He does have a skin rash on the abdomen; he does not know when this started but has recently noticed it.  He denies shortness of breath or chest pain.  He denies nausea, vomiting, or GI bleeding history.  HEENT is negative.  NEURO:  He denies history of stroke or TIA, but does admit to some recent hallucinations which he thinks is related to the Vicodin.  He denies musculoskeletal complaints, specifically no joint or muscular pain.  He denies fever, chills, recent weight loss, or gain.  FAMILY HISTORY:  As mentioned above negative for abdominal aortic aneurysm or other significant medical problems.  SOCIAL HISTORY:  Tobacco one pack per day smoker.  He is retired from [employer]  He has an occasional alcoholic beverage.  PHYSICAL EXAMINATION:  VITAL SIGNS:  Blood pressure 123/68, heart rate 66 and regular, respirations 18, oxygen saturation 99% on 2 L oxygen. GENERAL:  He is a  white male in no acute distress.  He is alert and oriented x3. HEENT:  EOMI, PERRL. NECK:  2+ carotid pulses bilaterally. CHEST:  Clear to auscultation. CARDIAC:  Regular rate and rhythm without murmur. ABDOMEN:  Soft, nontender, nondistended.  No obvious masses. EXTREMITIES:  He has 2+ femoral, popliteal, dorsalis pedis, and posterior tibial pulses bilaterally.  Has 2+ radial pulses bilaterally. He has some mild edema extending from the ankle down into the foot on the right side.  He has no significant edema on the left side. NEUROLOGIC:  Symmetric upper extremity and lower extremity motor strength which is 5/5. SKIN:  The right foot and toes are dusky on toes 1 through 5.  They are also cool at the tip with decreased capillary refill compared to the left.  Capillary  refill on the right side is about 3-5 seconds; on the left side it is almost immediate.  He also has mottling of the plantar aspect of the right foot.  LABORATORY:  White blood cell is 7.9, hemoglobin 12, platelets 272. Sodium 136, potassium 3.7, BUN 12, creatinine 0.8, glucose 101.  PT 13.9, PTT 36, D-dimer 2.25.  CPK 951.  Urinalysis negative.  Chest x-ray negative.  ASSESSMENT:  Atheroembolic event to the right lower extremity with unknown source currently.  PLAN:  We will obtain CT angiogram of the abdomen and pelvis with lower extremity runoff this evening to see if we can find an atheroembolic source.  We will continue his aspirin.  We will also continue him on his heparin drip which was started at [facility].  If his CT scan has no source of embolic material, we will consider doing a chest CT as the next step for evaluation.  I have discussed with the family the risk of possible loss of toes or the forefoot on the right side.  We will admit to #2000.  We will also obtain a 12-lead EKG.     Janetta Hora. Fields, MD     CEF/MEDQ  D:  09/08/2010  T:  09/09/2010  Job:  [id number]  cc:   Dr. Virgina Organ Dr. KeysElectronically Signed by Fabienne Bruns MD on 09/30/2010 06:29:00 PM

## 2010-09-30 NOTE — Op Note (Signed)
NAMEMarland Kitchen  VAIL, VUNCANNON NO.:  [id number]  MEDICAL RECORD NO.:  [id number]  LOCATION:  2309                         FACILITY:  [facility]  PHYSICIAN:  Janetta Hora. Cordelle Dahmen, MD  DATE OF BIRTH:  Sep 24, 1932  DATE OF PROCEDURE:  09/15/2010 DATE OF DISCHARGE:                              OPERATIVE REPORT   PROCEDURE:  Repair of juxtarenal abdominal aortic aneurysm.  PREOPERATIVE DIAGNOSIS:  Juxtarenal abdominal aortic aneurysm.  POSTOPERATIVE DIAGNOSIS:  Juxtarenal abdominal aortic aneurysm.  ANESTHESIA:  General.  ASSISTANTS: 1. Della Goo, PA-C 2. Quita Skye. Hart Rochester, MD  INDICATIONS:  The patient is a 77 year old male who presented to the emergency room approximately 10 days ago with symptoms of atheroemboli to his right foot.  Preoperative workup showed a 4.6-cm juxtarenal abdominal aortic aneurysm.  He presents today for repair of this.  OPERATIVE FINDINGS: 1. Suprarenal clamp approximately 45 minutes. 2. 24-mm Dacron graft.  OPERATIVE DETAIL:  After obtaining informed consent, the patient was taken to the operating room.  The patient was placed in supine position on the operating table.  After induction of general anesthesia and endotracheal intubation, a nasogastric tube was placed.  A Foley catheter was also placed.  The patient was then prepped and draped in the usual sterile fashion from the nipples to the knees.  Midline laparotomy incision was made extending from the xiphoid down to the pubis.  Incision was carried down through the subcutaneous tissues, and the fascia and peritoneum were incised with full lengthy incision.  On entering the abdomen, there were some adhesions to the right lower quadrant mainly consistent consisting of omentum.  These were all taken down with cautery.  The omentum and transverse colon then were reflected superiorly.  A small bowel was reflected to the right and the sigmoid colon reflected the left.  Omni retractor was  brought up in the operative field for assistance in retraction.  The retroperitoneum was opened with cautery.  The left and right common iliac arteries were dissected free.  The left common iliac artery was dissected free circumferentially and vessel loop was placed around this.  There was some adhesions on the posterior wall of the right common iliac arteries, so the anterior two third surface was dissected free in the posterior wall was left undissected.  The IMA was identified and this was stretched taut from the aneurysm and fairly small in caliber approximately 1.5 to possibly 2 mm.  This was a tight band across the aneurysm.  I did not dissect free circumferentially on this to try to avoid injuring this.  Dissection was then carried up to the level of the left renal vein.  Preoperative imaging had shown that the aneurysm was juxtarenal, therefore the renal vein was clamped proximally and distally so that the adrenal vein was on the distal segment.  This was then divided and oversewn with a running 5-0 Prolene suture.  Clamps were removed and additional repair stitch was placed on the distal end to obtain hemostasis.  Next, I proceeded to dissect free the suprarenal aorta.  Dissection was carried up all the way up to the inferior border of the superior mesenteric artery.  I  was able to find a reasonable segment to clamp the aorta just below the SMA and above the renal arteries.  The proximal aspect of the left and right renal arteries was heavily calcified.  These were dissected free circumferentially and vessel loops were placed around these.  The artery was of softer character out distally.  The patient was then given 10,000 units of intravenous heparin.  The patient was given an additional bolus of 2000 units of heparin during course of the case.  The left and right common iliac arteries were controlled with Henley clamps.  The aorta was then clamped between the superior  mesenteric artery and just above the left and right renal arteries.  The aorta was opened and all thrombus contents were removed. There was a single vessel bleeding from the lumbosacral portion of the aorta and this was oversewn with several 2-0 silk figure-of-eight sutures.  Attention was then turned to fashioning the neck of the aorta. This was tailored and teed off, so that there were several centimeters of neck just below the takeoff of the renal vessels.  A 24-mm Dacron graft was brought up in the operative field and sewn end to end to the aorta using a running 3-0 Prolene suture with a circumferential felt pledget.  The graft was sewn essentially to the base of the left and right renal arteries.  At completion, anastomosis was tested.  There was one area of leak along the right anterior wall and this was repaired with pledgeted 3-0 Prolene suture.  This was then tested again and found to be hemostatic and the clamp was moved down below the renal arteries. The left and right renal arteries had been controlled with vessel loops at the time of clamping the aorta.  These were reopened and Doppler was used to evaluate the left and right renal arteries and these had good Doppler flow.  The patient had been anuric during the suprarenal clamping portion and the suprarenal clamp was removed and he began to make urine again.  Attention was then turned to the distal anastomosis. The distal portion of the graft was transected to length for anastomosis to the distal aorta.  A running 3-0 Prolene suture was then used to construct the anastomosis end to end to the distal aorta.  Just prior to completion of anastomosis, this was forebled, backbled, and thoroughly flushed.  There was some leak along the right posterior wall and this was repaired with two 3-0 Prolene pledgeted sutures.  Flow was then first restored to the right lower extremity.  There was a brief transient systolic pressure drop of  approximately 20 mmHg.  This then quickly recovered.  Flow was then restored to the left lower extremity and there was a similar pressure drop on this side.  The pressure then quickly recovered.  The feet were inspected and found to be pink.  The right foot had been trash previously and this was basically at its baseline state in appearance with some duskiness of the toes distally, but the forefoot and the remainder of the foot was pink in character and the patient had Doppler signals.  Attention was then turned to the inferior mesenteric artery.  This again was fairly small and diseased. There was some backbleeding from it and I felt that this was reasonable. I therefore ligated the IMA with a 2-0 silk tie.  Sigmoid colon was inspected and found to be pink and well perfused.  Hemostasis was obtained with administration of 100 mg of  protamine.  The aortic sac was then repaired using a running 2-0 Vicryl suture.  Retroperitoneal tissues were then closed over this using a running 2-0 Vicryl suture. Everything was hemostatic at this point.  The viscera returned to their normal position.  The fascia was reapproximated using #1 PDS suture. Skin was closed with staples.  The patient had palpable femoral pulses and warm, pink-looking feet at the end of the case.  The patient was extubated in the operating room and taken to the recovery room in stable condition.     Janetta Hora. Elic Vencill, MD     CEF/MEDQ  D:  09/15/2010  T:  09/16/2010  Job:  [id number]  Electronically Signed by Fabienne Bruns MD on 09/30/2010 06:29:03 PM

## 2010-10-13 ENCOUNTER — Ambulatory Visit (INDEPENDENT_AMBULATORY_CARE_PROVIDER_SITE_OTHER): Payer: Self-pay | Admitting: Internal Medicine

## 2010-10-21 ENCOUNTER — Ambulatory Visit: Payer: Medicare Other | Admitting: Vascular Surgery

## 2010-11-08 LAB — BASIC METABOLIC PANEL
BUN: 15
Calcium: 9.4
Creatinine, Ser: 1
Glucose, Bld: 127 — ABNORMAL HIGH
Sodium: 137

## 2010-11-08 LAB — HEMOGLOBIN AND HEMATOCRIT, BLOOD: Hemoglobin: 13.1

## 2010-11-12 LAB — BASIC METABOLIC PANEL
BUN: 15
CO2: 27
Calcium: 9.2
GFR calc non Af Amer: >60
Glucose, Bld: 131 — ABNORMAL HIGH
Potassium: 4.2
Sodium: 136

## 2011-08-03 ENCOUNTER — Other Ambulatory Visit: Payer: Self-pay | Admitting: *Deleted

## 2011-08-03 DIAGNOSIS — Z48812 Encounter for surgical aftercare following surgery on the circulatory system: Secondary | ICD-10-CM

## 2011-08-03 DIAGNOSIS — I714 Abdominal aortic aneurysm, without rupture: Secondary | ICD-10-CM

## 2011-10-10 ENCOUNTER — Other Ambulatory Visit: Payer: Self-pay | Admitting: Vascular Surgery

## 2011-10-10 LAB — CREATININE, SERUM: Creat: 0.98 mg/dL (ref 0.50–1.35)

## 2011-10-10 LAB — BUN: BUN: 12 mg/dL (ref 6–23)

## 2011-10-12 ENCOUNTER — Encounter: Payer: Self-pay | Admitting: Vascular Surgery

## 2011-10-13 ENCOUNTER — Encounter: Payer: Self-pay | Admitting: Vascular Surgery

## 2011-10-13 ENCOUNTER — Ambulatory Visit (INDEPENDENT_AMBULATORY_CARE_PROVIDER_SITE_OTHER): Payer: Medicare Other | Admitting: Vascular Surgery

## 2011-10-13 ENCOUNTER — Ambulatory Visit
Admission: RE | Admit: 2011-10-13 | Discharge: 2011-10-13 | Disposition: A | Discharge status: Home / Self Care | Payer: Medicare Other | Source: Ambulatory Visit | Attending: Vascular Surgery | Admitting: Vascular Surgery

## 2011-10-13 VITALS — BP 148/69 | HR 56 | Temp 98.0°F | Resp 16 | Ht 66.0 in | Wt 180.1 lb

## 2011-10-13 DIAGNOSIS — I714 Abdominal aortic aneurysm, without rupture: Secondary | ICD-10-CM

## 2011-10-13 DIAGNOSIS — Z48812 Encounter for surgical aftercare following surgery on the circulatory system: Secondary | ICD-10-CM

## 2011-10-13 MED ORDER — IOHEXOL 350 MG/ML SOLN
100.0000 mL | Freq: Once | INTRAVENOUS | Status: AC | PRN
  Administered 2011-10-13: 100 mL via INTRAVENOUS

## 2011-10-13 NOTE — Progress Notes (Signed)
Patient is a 78 year old male status post repair of abdominal aortic aneurysm in August of 2012. He returns for further followup today. He presented with atheroemboli. This was juxtarenal aneurysm. He denies any problems with abdominal or back pain. His feet have completely healed at this point. He has no complaints.  Review of systems: He denies shortness of breath. He denies chest pain.  Physical exam: Filed Vitals:   10/13/11 1215  BP: 148/69  Pulse: 56  Temp: 98 F (36.7 C)  TempSrc: Oral  Resp: 16  Height: 5\' 6"  (1.676 m)  Weight: 180 lb 1.6 oz (81.693 kg)  SpO2: 96%   abdomen: Soft nontender nondistended well-healed incision  Extremities 2+ femoral pulses bilaterally  Feet: Pink and warm bilaterally  CT of abdomen and pelvis was reviewed today. The radiograph is 24 mm in diameter. The aorta has dilated slightly to 3.7 cm. There is no proximal or distal anastomotic aneurysm. Overall the scan was normal as far as his aorta was concerned.  Assessment: Doing well status post aortic aneurysm repair.  Plan: Followup on as-needed basis.  Fabienne Bruns, MD [facility] Office: [phone] Pager: [phone]

## 2012-05-23 ENCOUNTER — Encounter: Payer: Self-pay | Admitting: *Deleted

## 2012-06-02 ENCOUNTER — Other Ambulatory Visit: Payer: Self-pay | Admitting: Family Medicine

## 2012-06-19 ENCOUNTER — Other Ambulatory Visit: Payer: Self-pay | Admitting: Family Medicine

## 2012-06-20 ENCOUNTER — Other Ambulatory Visit: Payer: Self-pay | Admitting: Family Medicine

## 2012-06-22 ENCOUNTER — Other Ambulatory Visit: Payer: Self-pay | Admitting: Family Medicine

## 2012-07-02 ENCOUNTER — Other Ambulatory Visit: Payer: Self-pay | Admitting: Family Medicine

## 2012-07-09 ENCOUNTER — Other Ambulatory Visit: Payer: Self-pay | Admitting: *Deleted

## 2012-07-09 MED ORDER — AMLODIPINE BESYLATE 5 MG PO TABS: 5.0000 mg | ORAL_TABLET | Freq: Every day | ORAL | Status: DC

## 2012-07-20 ENCOUNTER — Other Ambulatory Visit: Payer: Self-pay

## 2012-07-20 MED ORDER — METFORMIN HCL 500 MG PO TABS: 500.0000 mg | ORAL_TABLET | Freq: Every day | ORAL | Status: DC

## 2012-08-01 ENCOUNTER — Ambulatory Visit: Payer: Self-pay | Admitting: Family Medicine

## 2012-08-06 ENCOUNTER — Ambulatory Visit (INDEPENDENT_AMBULATORY_CARE_PROVIDER_SITE_OTHER): Payer: Medicare Other | Admitting: Family Medicine

## 2012-08-06 ENCOUNTER — Encounter: Payer: Self-pay | Admitting: Family Medicine

## 2012-08-06 VITALS — BP 131/70 | HR 58 | Temp 97.1°F | Wt 187.4 lb

## 2012-08-06 DIAGNOSIS — E119 Type 2 diabetes mellitus without complications: Secondary | ICD-10-CM

## 2012-08-06 DIAGNOSIS — F17200 Nicotine dependence, unspecified, uncomplicated: Secondary | ICD-10-CM

## 2012-08-06 DIAGNOSIS — E785 Hyperlipidemia, unspecified: Secondary | ICD-10-CM

## 2012-08-06 DIAGNOSIS — I10 Essential (primary) hypertension: Secondary | ICD-10-CM | POA: Insufficient documentation

## 2012-08-06 DIAGNOSIS — E039 Hypothyroidism, unspecified: Secondary | ICD-10-CM

## 2012-08-06 DIAGNOSIS — E559 Vitamin D deficiency, unspecified: Secondary | ICD-10-CM

## 2012-08-06 DIAGNOSIS — J309 Allergic rhinitis, unspecified: Secondary | ICD-10-CM

## 2012-08-06 DIAGNOSIS — J302 Other seasonal allergic rhinitis: Secondary | ICD-10-CM

## 2012-08-06 LAB — POCT GLYCOSYLATED HEMOGLOBIN (HGB A1C): Hemoglobin A1C: 6

## 2012-08-06 LAB — POCT UA - MICROALBUMIN: Microalbumin Ur, POC: POSITIVE mg/L

## 2012-08-06 MED ORDER — BENAZEPRIL HCL 40 MG PO TABS: 40.0000 mg | ORAL_TABLET | Freq: Every day | ORAL | Status: DC

## 2012-08-06 MED ORDER — METFORMIN HCL 500 MG PO TABS: 500.0000 mg | ORAL_TABLET | Freq: Every day | ORAL | Status: DC

## 2012-08-06 MED ORDER — METOPROLOL TARTRATE 100 MG PO TABS: ORAL_TABLET | ORAL | Status: DC

## 2012-08-06 MED ORDER — ROSUVASTATIN CALCIUM 40 MG PO TABS: ORAL_TABLET | ORAL | Status: DC

## 2012-08-06 MED ORDER — LEVOTHYROXINE SODIUM 150 MCG PO TABS: 150.0000 ug | ORAL_TABLET | Freq: Every day | ORAL | Status: DC

## 2012-08-06 MED ORDER — FLUTICASONE PROPIONATE 50 MCG/ACT NA SUSP: 2.0000 | Freq: Every day | NASAL | Status: DC

## 2012-08-06 MED ORDER — AMLODIPINE BESYLATE 5 MG PO TABS: 5.0000 mg | ORAL_TABLET | Freq: Every day | ORAL | Status: DC

## 2012-08-06 NOTE — Progress Notes (Signed)
Patient ID: Dillon Sweeney, male   DOB: [birth year], 79 y.o.   MRN: [id number] SUBJECTIVE: CC: Chief Complaint  Patient presents with  . Follow-up    60month follow up fasting  . Medication Refill    90 day supply needs refills    HPI: Patient is here for follow up of Diabetes Mellitus/hypertension/hypothyroidism/vitamin D Deficiency: Symptoms of DM: Denies Nocturia ,Denies Urinary Frequency , denies Blurred vision ,deniesDizziness,denies.Dysuria,denies paresthesias, denies extremity pain or ulcers.Marland Kitchendenies chest pain. has had an annual eye exam. do check the feet. Does check CBGs. Average CBG:90s Denies episodes of hypoglycemia. Does have an emergency hypoglycemic plan. admits toCompliance with medications. Denies Problems with medications.  occasional exposure to cigarette smoke.  Past Medical History  Diagnosis Date  . Chronic constipation   . Hypertension   . Dyslipidemia   . Hypothyroidism   . Diabetes mellitus     Type 2  . Hyperlipidemia   . CAD (coronary artery disease)   . AAA (abdominal aortic aneurysm)   . Myocardial infarction 09/22/10    1986  . Pneumonia 2012   Past Surgical History  Procedure Laterality Date  . Appendectomy    . Tonsillectomy    . Coronary artery bypass graft  1986    left leg vein harvest  . Abdominal aortic aneurysm repair  09/15/10    Juxtarenal AAA repair    History   Social History  . Marital Status: Married    Spouse Name: N/A    Number of Children: N/A  . Years of Education: N/A   Occupational History  . Not on file.   Social History Main Topics  . Smoking status: Former Smoker -- 1.00 packs/day    Types: Cigarettes    Quit date: 09/10/2010  . Smokeless tobacco: Not on file  . Alcohol Use: Yes     Comment: occasionally  . Drug Use: No  . Sexually Active:    Other Topics Concern  . Not on file   Social History Narrative  . No narrative on file   Family History  Problem Relation Age of Onset  . Stroke Father    . Cancer Sister    Current Outpatient Prescriptions on File Prior to Visit  Medication Sig Dispense Refill  . Acetaminophen (TYLENOL PO) Take 1-2 tablets by mouth every 6 (six) hours as needed.        . calcium-vitamin D (OSCAL) 250-125 MG-UNIT per tablet Take 1 tablet by mouth daily.        . multivitamin (THERAGRAN) per tablet Take 1 tablet by mouth daily.        Marland Kitchen aspirin 81 MG tablet Take 81 mg by mouth daily.        . famotidine (PEPCID) 20 MG tablet Take 20 mg by mouth 2 (two) times daily.        . metoprolol (TOPROL-XL) 50 MG 24 hr tablet Take 50 mg by mouth 2 (two) times daily.        Marland Kitchen moxifloxacin (AVELOX) 400 MG tablet Take 400 mg by mouth daily.        Marland Kitchen oxyCODONE-acetaminophen (PERCOCET) 10-325 MG per tablet Take 1 tablet by mouth every 4 (four) hours as needed.         No current facility-administered medications on file prior to visit.   Allergies  Allergen Reactions  . Morphine And Related     hallucinations   Immunization History  Administered Date(s) Administered  . Pneumococcal Polysaccharide 08/02/2011  . Tdap 08/02/2011  Prior to Admission medications   Medication Sig Start Date End Date Taking? Authorizing Provider  Acetaminophen (TYLENOL PO) Take 1-2 tablets by mouth every 6 (six) hours as needed.     Yes Historical Provider, MD  amLODipine (NORVASC) 5 MG tablet Take 1 tablet (5 mg total) by mouth daily. 08/06/12  Yes Ileana Ladd, MD  benazepril (LOTENSIN) 40 MG tablet Take 1 tablet (40 mg total) by mouth daily. 08/06/12  Yes Ileana Ladd, MD  calcium-vitamin D (OSCAL) 250-125 MG-UNIT per tablet Take 1 tablet by mouth daily.     Yes Historical Provider, MD  fluticasone (FLONASE) 50 MCG/ACT nasal spray Place 2 sprays into the nose daily. 08/06/12  Yes Ileana Ladd, MD  levothyroxine (SYNTHROID, LEVOTHROID) 150 MCG tablet Take 1 tablet (150 mcg total) by mouth daily before breakfast. 08/06/12  Yes Ileana Ladd, MD  metFORMIN (GLUCOPHAGE) 500 MG tablet Take 1  tablet (500 mg total) by mouth daily with breakfast. 08/06/12  Yes Ileana Ladd, MD  multivitamin [facility]) per tablet Take 1 tablet by mouth daily.     Yes Historical Provider, MD  rosuvastatin (CRESTOR) 40 MG tablet TAKE ONE TABLET BY MOUTH EVERY DAY 08/06/12  Yes Ileana Ladd, MD  aspirin 81 MG tablet Take 81 mg by mouth daily.      Historical Provider, MD  famotidine (PEPCID) 20 MG tablet Take 20 mg by mouth 2 (two) times daily.      Historical Provider, MD  metoprolol (LOPRESSOR) 100 MG tablet TAKE ONE-HALF TABLET BY MOUTH TWICE DAILY 08/06/12   Ileana Ladd, MD  metoprolol (TOPROL-XL) 50 MG 24 hr tablet Take 50 mg by mouth 2 (two) times daily.      Historical Provider, MD  moxifloxacin (AVELOX) 400 MG tablet Take 400 mg by mouth daily.      Historical Provider, MD  oxyCODONE-acetaminophen (PERCOCET) 10-325 MG per tablet Take 1 tablet by mouth every 4 (four) hours as needed.      Historical Provider, MD    ROS: As above in the HPI. All other systems are stable or negative.  OBJECTIVE: APPEARANCE:  Patient in no acute distress.The patient appeared well nourished and normally developed. Acyanotic. Waist: VITAL SIGNS:BP 131/70  Pulse 58  Temp(Src) 97.1 F (36.2 C) (Oral)  Wt 187 lb 6.4 oz (85.004 kg)  BMI 30.26 kg/m2   SKIN: warm and  Dry without overt rashes, tattoos and scars  HEAD and Neck: without JVD, Head and scalp: normal Eyes:No scleral icterus. Fundi normal, eye movements normal. Ears: Auricle normal, canal normal, Tympanic membranes normal, insufflation normal. Nose: normal Throat: normal Neck & thyroid: normal  CHEST & LUNGS: Chest wall: normal Lungs: Clear  CVS: Reveals the PMI to be normally located. Regular rhythm, First and Second Heart sounds are normal,  absence of murmurs, rubs or gallops. Peripheral vasculature: Radial pulses: normal Dorsal pedis pulses: normal Posterior pulses: normal  ABDOMEN:  Appearance: normal Benign, no organomegaly, no  masses, no Abdominal Aortic enlargement. No Guarding , no rebound. No Bruits. Bowel sounds: normal  RECTAL: N/A GU: N/A  EXTREMETIES: nonedematous. Both Femoral and Pedal pulses are normal.  MUSCULOSKELETAL:  Spine: normal Joints: intact  NEUROLOGIC: oriented to time,place and person; nonfocal. Strength is normal Sensory is normal Reflexes are normal Cranial Nerves are normal.  ASSESSMENT: Diabetes - Plan: metFORMIN (GLUCOPHAGE) 500 MG tablet, POCT glycosylated hemoglobin (Hb A1C), POCT UA - Microalbumin, Microalbumin, urine  HTN (hypertension) - Plan: metoprolol (LOPRESSOR) 100 MG tablet,  benazepril (LOTENSIN) 40 MG tablet, amLODipine (NORVASC) 5 MG tablet, COMPLETE METABOLIC PANEL WITH GFR  Unspecified vitamin D deficiency - Plan: Vitamin D 25 hydroxy  Unspecified hypothyroidism - Plan: levothyroxine (SYNTHROID, LEVOTHROID) 150 MCG tablet, TSH  Tobacco use disorder  Seasonal allergic rhinitis - Plan: fluticasone (FLONASE) 50 MCG/ACT nasal spray  HLD (hyperlipidemia) - Plan: rosuvastatin (CRESTOR) 40 MG tablet, COMPLETE METABOLIC PANEL WITH GFR, NMR Lipoprofile with Lipids    PLAN: Orders Placed This Encounter  Procedures  . COMPLETE METABOLIC PANEL WITH GFR  . NMR Lipoprofile with Lipids  . TSH  . Vitamin D 25 hydroxy  . Microalbumin, urine  . POCT glycosylated hemoglobin (Hb A1C)  . POCT UA - Microalbumin   Meds ordered this encounter  Medications  . DISCONTD: fluticasone (FLONASE) 50 MCG/ACT nasal spray    Sig: Place 2 sprays into the nose daily.  . metoprolol (LOPRESSOR) 100 MG tablet    Sig: TAKE ONE-HALF TABLET BY MOUTH TWICE DAILY    Dispense:  90 tablet    Refill:  3  . metFORMIN (GLUCOPHAGE) 500 MG tablet    Sig: Take 1 tablet (500 mg total) by mouth daily with breakfast.    Dispense:  90 tablet    Refill:  3    Needs glucose level drawn now  . benazepril (LOTENSIN) 40 MG tablet    Sig: Take 1 tablet (40 mg total) by mouth daily.    Dispense:   90 tablet    Refill:  3  . fluticasone (FLONASE) 50 MCG/ACT nasal spray    Sig: Place 2 sprays into the nose daily.    Dispense:  48 g    Refill:  1  . amLODipine (NORVASC) 5 MG tablet    Sig: Take 1 tablet (5 mg total) by mouth daily.    Dispense:  30 tablet    Refill:  2  . levothyroxine (SYNTHROID, LEVOTHROID) 150 MCG tablet    Sig: Take 1 tablet (150 mcg total) by mouth daily before breakfast.    Dispense:  90 tablet    Refill:  3  . rosuvastatin (CRESTOR) 40 MG tablet    Sig: TAKE ONE TABLET BY MOUTH EVERY DAY    Dispense:  90 tablet    Refill:  3        Dr Woodroe Mode Recommendations  Diet and Exercise discussed with patient.  For nutrition information, I recommend books:  1).Eat to Live by Dr Monico Hoar. 2).Prevent and Reverse Heart Disease by Dr Suzzette Righter. 3) Dr Katherina Right Book:  Program to Reverse Diabetes  Exercise recommendations are:  If unable to walk, then the patient can exercise in a chair 3 times a day. By flapping arms like a bird gently and raising legs outwards to the front.  If ambulatory, the patient can go for walks for 30 minutes 3 times a week. Then increase the intensity and duration as tolerated.  Goal is to try to attain exercise frequency to 5 times a week.  If applicable: Best to perform resistance exercises (machines or weights) 2 days a week and cardio type exercises 3 days per week.  Handout and  discusion on foot care in the AVS. counselled on smoking cessation and  avoidance of  cigarette smoke.   Return in about 4 months (around 12/07/2012) for Recheck medical problems, recheck BP.  Lashon Beringer P. Modesto Charon, M.D.

## 2012-08-06 NOTE — Patient Instructions (Addendum)
    Dr Steed Kanaan's Recommendations  Diet and Exercise discussed with patient.  For nutrition information, I recommend books:  1).Eat to Live by Dr Joel Fuhrman. 2).Prevent and Reverse Heart Disease by Dr Caldwell Esselstyn. 3) Dr Neal Barnard's Book:  Program to Reverse Diabetes  Exercise recommendations are:  If unable to walk, then the patient can exercise in a chair 3 times a day. By flapping arms like a bird gently and raising legs outwards to the front.  If ambulatory, the patient can go for walks for 30 minutes 3 times a week. Then increase the intensity and duration as tolerated.  Goal is to try to attain exercise frequency to 5 times a week.  If applicable: Best to perform resistance exercises (machines or weights) 2 days a week and cardio type exercises 3 days per week.  Diabetes and Foot Care Diabetes may cause you to have a poor blood supply (circulation) to your legs and feet. Because of this, the skin may be thinner, break easier, and heal more slowly. You also may have nerve damage in your legs and feet causing decreased feeling. You may not notice minor injuries to your feet that could lead to serious problems or infections. Taking care of your feet is one of the most important things you can do for yourself.  HOME CARE INSTRUCTIONS  Do not go barefoot. Bare feet are easily injured.  Check your feet daily for blisters, cuts, and redness.  Wash your feet with warm water (not hot) and mild soap. Pat your feet and between your toes until completely dry.  Apply a moisturizing lotion that does not contain alcohol or petroleum jelly to the dry skin on your feet and to dry brittle toenails. Do not put it between your toes.  Trim your toenails straight across. Do not dig under them or around the cuticle.  Do not cut corns or calluses, or try to remove them with medicine.  Wear clean cotton socks or stockings every day. Make sure they are not too tight. Do not wear knee  high stockings since they may decrease blood flow to your legs.  Wear leather shoes that fit properly and have enough cushioning. To break in new shoes, wear them just a few hours a day to avoid injuring your feet.  Wear shoes at all times, even in the house.  Do not cross your legs. This may decrease the blood flow to your feet.  If you find a minor scrape, cut, or break in the skin on your feet, keep it and the skin around it clean and dry. These areas may be cleansed with mild soap and water. Do not use peroxide, alcohol, iodine or Merthiolate.  When you remove an adhesive bandage, be sure not to harm the skin around it.  If you have a wound, look at it several times a day to make sure it is healing.  Do not use heating pads or hot water bottles. Burns can occur. If you have lost feeling in your feet or legs, you may not know it is happening until it is too late.  Report any cuts, sores or bruises to your caregiver. Do not wait! SEEK MEDICAL CARE IF:   You have an injury that is not healing or you notice redness, numbness, burning, or tingling.  Your feet always feel cold.  You have pain or cramps in your legs and feet. SEEK IMMEDIATE MEDICAL CARE IF:   There is increasing redness, swelling, or   increasing pain in the wound.  There is a red line that goes up your leg.  Pus is coming from a wound.  You develop an unexplained oral temperature above 102 F (38.9 C), or as your caregiver suggests.  You notice a bad smell coming from an ulcer or wound. MAKE SURE YOU:   Understand these instructions.  Will watch your condition.  Will get help right away if you are not doing well or get worse. Document Released: 01/15/2000 Document Revised: 04/11/2011 Document Reviewed: 07/23/2008 Exit[employer] Patient Information 2014 [employer].  

## 2012-08-07 LAB — NMR LIPOPROFILE WITH LIPIDS
Cholesterol, Total: 114 mg/dL (ref <200)
HDL Particle Number: 29.7 umol/L — ABNORMAL LOW (ref >=30.5)
HDL Size: 8.7 nm — ABNORMAL LOW (ref >=9.2)
HDL-C: 44 mg/dL (ref >=40)
LDL (calc): 57 mg/dL (ref <100)
LDL Particle Number: 994 nmol/L (ref <1000)
LDL Size: 20.6 nm (ref >20.5)
LP-IR Score: 57 — ABNORMAL HIGH (ref <=45)
Large HDL-P: 3 umol/L — ABNORMAL LOW (ref >=4.8)
Large VLDL-P: 1.5 nmol/L (ref <=2.7)
Small LDL Particle Number: 540 nmol/L — ABNORMAL HIGH (ref <=527)
Triglycerides: 67 mg/dL (ref <150)
VLDL Size: 47.9 nm — ABNORMAL HIGH (ref <=46.6)

## 2012-08-07 LAB — COMPLETE METABOLIC PANEL WITH GFR
ALT: 18 U/L (ref 0–53)
AST: 19 U/L (ref 0–37)
Albumin: 4.5 g/dL (ref 3.5–5.2)
Alkaline Phosphatase: 40 U/L (ref 39–117)
BUN: 15 mg/dL (ref 6–23)
CO2: 28 mEq/L (ref 19–32)
Calcium: 9.5 mg/dL (ref 8.4–10.5)
Chloride: 106 mEq/L (ref 96–112)
Creat: 0.94 mg/dL (ref 0.50–1.35)
GFR, Est African American: 89 mL/min
GFR, Est Non African American: 77 mL/min
Glucose, Bld: 106 mg/dL — ABNORMAL HIGH (ref 70–99)
Potassium: 4.6 mEq/L (ref 3.5–5.3)
Sodium: 137 mEq/L (ref 135–145)
Total Bilirubin: 0.5 mg/dL (ref 0.3–1.2)
Total Protein: 7.1 g/dL (ref 6.0–8.3)

## 2012-08-07 LAB — MICROALBUMIN, URINE: Microalb, Ur: 0.78 mg/dL (ref 0.00–1.89)

## 2012-08-07 LAB — VITAMIN D 25 HYDROXY (VIT D DEFICIENCY, FRACTURES): Vit D, 25-Hydroxy: 44 ng/mL (ref 30–89)

## 2012-08-07 LAB — TSH: TSH: 1.466 u[IU]/mL (ref 0.350–4.500)

## 2012-08-07 NOTE — Progress Notes (Signed)
Quick Note:  Lab result at goal. No change in Medications for now. No Change in plans and follow up. Excellent. No changes ______

## 2012-10-15 ENCOUNTER — Encounter: Payer: Self-pay | Admitting: *Deleted

## 2012-12-06 ENCOUNTER — Ambulatory Visit (INDEPENDENT_AMBULATORY_CARE_PROVIDER_SITE_OTHER): Payer: Medicare Other | Admitting: Family Medicine

## 2012-12-06 ENCOUNTER — Encounter: Payer: Self-pay | Admitting: Family Medicine

## 2012-12-06 VITALS — BP 125/71 | HR 60 | Temp 97.7°F | Ht 68.0 in | Wt 187.4 lb

## 2012-12-06 DIAGNOSIS — E119 Type 2 diabetes mellitus without complications: Secondary | ICD-10-CM

## 2012-12-06 DIAGNOSIS — I714 Abdominal aortic aneurysm, without rupture, unspecified: Secondary | ICD-10-CM

## 2012-12-06 DIAGNOSIS — I10 Essential (primary) hypertension: Secondary | ICD-10-CM

## 2012-12-06 DIAGNOSIS — Z23 Encounter for immunization: Secondary | ICD-10-CM

## 2012-12-06 DIAGNOSIS — E039 Hypothyroidism, unspecified: Secondary | ICD-10-CM

## 2012-12-06 DIAGNOSIS — E785 Hyperlipidemia, unspecified: Secondary | ICD-10-CM

## 2012-12-06 DIAGNOSIS — K8020 Calculus of gallbladder without cholecystitis without obstruction: Secondary | ICD-10-CM | POA: Insufficient documentation

## 2012-12-06 DIAGNOSIS — N210 Calculus in bladder: Secondary | ICD-10-CM | POA: Insufficient documentation

## 2012-12-06 DIAGNOSIS — I7090 Unspecified atherosclerosis: Secondary | ICD-10-CM | POA: Insufficient documentation

## 2012-12-06 DIAGNOSIS — F17200 Nicotine dependence, unspecified, uncomplicated: Secondary | ICD-10-CM

## 2012-12-06 DIAGNOSIS — E559 Vitamin D deficiency, unspecified: Secondary | ICD-10-CM

## 2012-12-06 LAB — POCT GLYCOSYLATED HEMOGLOBIN (HGB A1C): Hemoglobin A1C: 5.6

## 2012-12-06 MED ORDER — AMLODIPINE BESYLATE 5 MG PO TABS: 5.0000 mg | ORAL_TABLET | Freq: Every day | ORAL | Status: DC

## 2012-12-06 NOTE — Patient Instructions (Signed)
Diabetes and Foot Care Diabetes may cause you to have problems because of poor blood supply (circulation) to your feet and legs. This may cause the skin on your feet to become thinner, break easier, and heal more slowly. Your skin may become dry, and the skin may peel and crack. You may also have nerve damage in your legs and feet causing decreased feeling in them. You may not notice minor injuries to your feet that could lead to infections or more serious problems. Taking care of your feet is one of the most important things you can do for yourself.  HOME CARE INSTRUCTIONS  Wear shoes at all times, even in the house. Do not go barefoot. Bare feet are easily injured.  Check your feet daily for blisters, cuts, and redness. If you cannot see the bottom of your feet, use a mirror or ask someone for help.  Wash your feet with warm water (do not use hot water) and mild soap. Then pat your feet and the areas between your toes until they are completely dry. Do not soak your feet as this can dry your skin.  Apply a moisturizing lotion or petroleum jelly (that does not contain alcohol and is unscented) to the skin on your feet and to dry, brittle toenails. Do not apply lotion between your toes.  Trim your toenails straight across. Do not dig under them or around the cuticle. File the edges of your nails with an emery board or nail file.  Do not cut corns or calluses or try to remove them with medicine.  Wear clean socks or stockings every day. Make sure they are not too tight. Do not wear knee-high stockings since they may decrease blood flow to your legs.  Wear shoes that fit properly and have enough cushioning. To break in new shoes, wear them for just a few hours a day. This prevents you from injuring your feet. Always look in your shoes before you put them on to be sure there are no objects inside.  Do not cross your legs. This may decrease the blood flow to your feet.  If you find a minor scrape,  cut, or break in the skin on your feet, keep it and the skin around it clean and dry. These areas may be cleansed with mild soap and water. Do not cleanse the area with peroxide, alcohol, or iodine.  When you remove an adhesive bandage, be sure not to damage the skin around it.  If you have a wound, look at it several times a day to make sure it is healing.  Do not use heating pads or hot water bottles. They may burn your skin. If you have lost feeling in your feet or legs, you may not know it is happening until it is too late.  Make sure your health care provider performs a complete foot exam at least annually or more often if you have foot problems. Report any cuts, sores, or bruises to your health care provider immediately. SEEK MEDICAL CARE IF:   You have an injury that is not healing.  You have cuts or breaks in the skin.  You have an ingrown nail.  You notice redness on your legs or feet.  You feel burning or tingling in your legs or feet.  You have pain or cramps in your legs and feet.  Your legs or feet are numb.  Your feet always feel cold. SEEK IMMEDIATE MEDICAL CARE IF:   There is increasing redness,   swelling, or pain in or around a wound.  There is a red line that goes up your leg.  Pus is coming from a wound.  You develop a fever or as directed by your health care provider.  You notice a bad smell coming from an ulcer or wound. Document Released: 01/15/2000 Document Revised: 09/19/2012 Document Reviewed: 06/26/2012 [employer] Patient Information 2014 [employer].        Dr Trey Gulbranson's Recommendations  For nutrition information, I recommend books:  1).Eat to Live by Dr Joel Fuhrman. 2).Prevent and Reverse Heart Disease by Dr Caldwell Esselstyn. 3) Dr Neal Barnard's Book:  Program to Reverse Diabetes  Exercise recommendations are:  If unable to walk, then the patient can exercise in a chair 3 times a day. By flapping arms like a bird gently and  raising legs outwards to the front.  If ambulatory, the patient can go for walks for 30 minutes 3 times a week. Then increase the intensity and duration as tolerated.  Goal is to try to attain exercise frequency to 5 times a week.  If applicable: Best to perform resistance exercises (machines or weights) 2 days a week and cardio type exercises 3 days per week.  

## 2012-12-06 NOTE — Progress Notes (Signed)
SUBJECTIVE: CC: Chief Complaint  Patient presents with  . Follow-up    4 month follow up chronic problems  . Medication Refill    amlodipine 5mg  wants 90 day    HPI: Patient is here for follow up of Diabetes Mellitus/HTN/HLD/Hypothyroidism/AAA/ASCVD: Symptoms evaluated: Denies Nocturia ,Denies Urinary Frequency , denies Blurred vision ,deniesDizziness,denies.Dysuria,denies paresthesias, denies extremity pain or ulcers.Marland Kitchendenies chest pain. has had an annual eye exam. do check the feet. Does check CBGs. Average CBG:110 Denies episodes of hypoglycemia. Does have an emergency hypoglycemic plan. admits toCompliance with medications. Denies Problems with medications.   Constipation is his major problem and it is due to meds otherwise he is doing great.manages it with fiber and a laxative prn. This is chronic. Feels great.  Past Medical History  Diagnosis Date  . Chronic constipation   . Hypertension   . Dyslipidemia   . Hypothyroidism   . Diabetes mellitus     Type 2  . CAD (coronary artery disease)   . AAA (abdominal aortic aneurysm)   . Pneumonia 2012  . Atherosclerosis   . Myocardial infarction 09/22/10    1986  . Cholelithiasis   . Bladder stones   . Hyperlipidemia    Past Surgical History  Procedure Laterality Date  . Appendectomy    . Tonsillectomy    . Coronary artery bypass graft  1986    left leg vein harvest  . Abdominal aortic aneurysm repair  09/15/10    Juxtarenal AAA repair    History   Social History  . Marital Status: Married    Spouse Name: N/A    Number of Children: N/A  . Years of Education: N/A   Occupational History  . Not on file.   Social History Main Topics  . Smoking status: Former Smoker -- 1.00 packs/day    Types: Cigarettes    Quit date: 09/10/2010  . Smokeless tobacco: Not on file  . Alcohol Use: Yes     Comment: occasionally  . Drug Use: No  . Sexual Activity:    Other Topics Concern  . Not on file   Social History  Narrative  . No narrative on file   Family History  Problem Relation Age of Onset  . Stroke Father   . Cancer Sister    Current Outpatient Prescriptions on File Prior to Visit  Medication Sig Dispense Refill  . Acetaminophen (TYLENOL PO) Take 1-2 tablets by mouth every 6 (six) hours as needed.        Marland Kitchen aspirin 81 MG tablet Take 81 mg by mouth daily.        . benazepril (LOTENSIN) 40 MG tablet Take 1 tablet (40 mg total) by mouth daily.  90 tablet  3  . calcium-vitamin D (OSCAL) 250-125 MG-UNIT per tablet Take 1 tablet by mouth daily.        . famotidine (PEPCID) 20 MG tablet Take 20 mg by mouth 2 (two) times daily.        . fluticasone (FLONASE) 50 MCG/ACT nasal spray Place 2 sprays into the nose daily.  48 g  1  . levothyroxine (SYNTHROID, LEVOTHROID) 150 MCG tablet Take 1 tablet (150 mcg total) by mouth daily before breakfast.  90 tablet  3  . metFORMIN (GLUCOPHAGE) 500 MG tablet Take 1 tablet (500 mg total) by mouth daily with breakfast.  90 tablet  3  . metoprolol (LOPRESSOR) 100 MG tablet TAKE ONE-HALF TABLET BY MOUTH TWICE DAILY  90 tablet  3  . metoprolol (TOPROL-XL)  50 MG 24 hr tablet Take 50 mg by mouth 2 (two) times daily.        . multivitamin (THERAGRAN) per tablet Take 1 tablet by mouth daily.        Marland Kitchen oxyCODONE-acetaminophen (PERCOCET) 10-325 MG per tablet Take 1 tablet by mouth every 4 (four) hours as needed.        . rosuvastatin (CRESTOR) 40 MG tablet TAKE ONE TABLET BY MOUTH EVERY DAY  90 tablet  3   No current facility-administered medications on file prior to visit.   Allergies  Allergen Reactions  . Morphine And Related     hallucinations   Immunization History  Administered Date(s) Administered  . Pneumococcal Polysaccharide 08/02/2011  . Tdap 08/02/2011   Prior to Admission medications   Medication Sig Start Date End Date Taking? Authorizing Provider  Acetaminophen (TYLENOL PO) Take 1-2 tablets by mouth every 6 (six) hours as needed.     Yes Historical  Provider, MD  amLODipine (NORVASC) 5 MG tablet Take 1 tablet (5 mg total) by mouth daily. 08/06/12  Yes Ileana Ladd, MD  aspirin 81 MG tablet Take 81 mg by mouth daily.     Yes Historical Provider, MD  benazepril (LOTENSIN) 40 MG tablet Take 1 tablet (40 mg total) by mouth daily. 08/06/12  Yes Ileana Ladd, MD  calcium-vitamin D (OSCAL) 250-125 MG-UNIT per tablet Take 1 tablet by mouth daily.     Yes Historical Provider, MD  famotidine (PEPCID) 20 MG tablet Take 20 mg by mouth 2 (two) times daily.     Yes Historical Provider, MD  fluticasone (FLONASE) 50 MCG/ACT nasal spray Place 2 sprays into the nose daily. 08/06/12  Yes Ileana Ladd, MD  levothyroxine (SYNTHROID, LEVOTHROID) 150 MCG tablet Take 1 tablet (150 mcg total) by mouth daily before breakfast. 08/06/12  Yes Ileana Ladd, MD  metFORMIN (GLUCOPHAGE) 500 MG tablet Take 1 tablet (500 mg total) by mouth daily with breakfast. 08/06/12  Yes Ileana Ladd, MD  metoprolol (LOPRESSOR) 100 MG tablet TAKE ONE-HALF TABLET BY MOUTH TWICE DAILY 08/06/12  Yes Ileana Ladd, MD  metoprolol (TOPROL-XL) 50 MG 24 hr tablet Take 50 mg by mouth 2 (two) times daily.     Yes Historical Provider, MD  multivitamin [facility]) per tablet Take 1 tablet by mouth daily.     Yes Historical Provider, MD  oxyCODONE-acetaminophen (PERCOCET) 10-325 MG per tablet Take 1 tablet by mouth every 4 (four) hours as needed.     Yes Historical Provider, MD  rosuvastatin (CRESTOR) 40 MG tablet TAKE ONE TABLET BY MOUTH EVERY DAY 08/06/12  Yes Ileana Ladd, MD     ROS: As above in the HPI. All other systems are stable or negative.  OBJECTIVE: APPEARANCE:  Patient in no acute distress.The patient appeared well nourished and normally developed. Acyanotic. Waist: VITAL SIGNS:BP 125/71  Pulse 60  Temp(Src) 97.7 F (36.5 C) (Oral)  Ht 5\' 8"  (1.727 m)  Wt 187 lb 6.4 oz (85.004 kg)  BMI 28.50 kg/m2  WM  SKIN: warm and  Dry without overt rashes, tattoos and scars  HEAD and  Neck: without JVD, Head and scalp: normal Eyes:No scleral icterus. Fundi normal, eye movements normal. Ears: Auricle normal, canal normal, Tympanic membranes normal, insufflation normal. Nose: normal Throat: normal Neck & thyroid: normal  CHEST & LUNGS: Chest wall: normal Lungs: Clear  CVS: Reveals the PMI to be normally located. Regular rhythm, First and Second Heart sounds are normal,  absence of murmurs, rubs or gallops. Peripheral vasculature: Radial pulses: normal Dorsal pedis pulses: normal Posterior pulses: normal  ABDOMEN:  Appearance: normal Benign, no organomegaly, no masses, no Abdominal Aortic enlargement. No Guarding , no rebound. No Bruits. Bowel sounds: normal  RECTAL: N/A GU: N/A  EXTREMETIES: nonedematous.  MUSCULOSKELETAL:  Spine: normal Joints: intact  NEUROLOGIC: oriented to time,place and person; nonfocal. Strength is normal Sensory is normal Reflexes are normal Cranial Nerves are normal.  Results for orders placed in visit on 08/06/12  COMPLETE METABOLIC PANEL WITH GFR      Result Value Range   Sodium 137  135 - 145 mEq/L   Potassium 4.6  3.5 - 5.3 mEq/L   Chloride 106  96 - 112 mEq/L   CO2 28  19 - 32 mEq/L   Glucose, Bld 106 (*) 70 - 99 mg/dL   BUN 15  6 - 23 mg/dL   Creat 1.61  0.96 - 0.45 mg/dL   Total Bilirubin 0.5  0.3 - 1.2 mg/dL   Alkaline Phosphatase 40  39 - 117 U/L   AST 19  0 - 37 U/L   ALT 18  0 - 53 U/L   Total Protein 7.1  6.0 - 8.3 g/dL   Albumin 4.5  3.5 - 5.2 g/dL   Calcium 9.5  8.4 - 40.9 mg/dL   GFR, Est African American 89     GFR, Est Non African American 77    NMR LIPOPROFILE WITH LIPIDS      Result Value Range   LDL Particle Number 994  <1000 nmol/L   LDL (calc) 57  <100 mg/dL   HDL-C 44  >=81 mg/dL   Triglycerides 67  <191 mg/dL   Cholesterol, Total 478  <200 mg/dL   HDL Particle Number 29.5 (*) >=30.5 umol/L   Large HDL-P 3.0 (*) >=4.8 umol/L   Large VLDL-P 1.5  <=2.7 nmol/L   Small LDL Particle  Number 540 (*) <=527 nmol/L   LDL Size 20.6  >20.5 nm   HDL Size 8.7 (*) >=9.2 nm   VLDL Size 47.9 (*) <=46.6 nm   LP-IR Score 57 (*) <=45  TSH      Result Value Range   TSH 1.466  0.350 - 4.500 uIU/mL  VITAMIN D 25 HYDROXY      Result Value Range   Vit D, 25-Hydroxy 44  30 - 89 ng/mL  MICROALBUMIN, URINE      Result Value Range   Microalb, Ur 0.78  0.00 - 1.89 mg/dL  POCT GLYCOSYLATED HEMOGLOBIN (HGB A1C)      Result Value Range   Hemoglobin A1C 6.0    POCT UA - MICROALBUMIN      Result Value Range   Microalbumin Ur, POC pos      ASSESSMENT:   Diabetes - Plan: POCT glycosylated hemoglobin (Hb A1C), CMP14+EGFR  HTN (hypertension) - Plan: CMP14+EGFR, amLODipine (NORVASC) 5 MG tablet  Unspecified hypothyroidism - Plan: TSH  Unspecified vitamin D deficiency - Plan: Vit D  25 hydroxy (rtn osteoporosis monitoring)  Tobacco use disorder  Abdominal aneurysm without mention of rupture  Atherosclerosis - Plan: Lipid panel  Cholelithiasis  Bladder stones  Hyperlipidemia  PLAN:      Dr Woodroe Mode Recommendations  For nutrition information, I recommend books:  1).Eat to Live by Dr Monico Hoar. 2).Prevent and Reverse Heart Disease by Dr Suzzette Righter. 3) Dr Katherina Right Book:  Program to Reverse Diabetes  Exercise recommendations are:  If unable to walk,  then the patient can exercise in a chair 3 times a day. By flapping arms like a bird gently and raising legs outwards to the front.  If ambulatory, the patient can go for walks for 30 minutes 3 times a week. Then increase the intensity and duration as tolerated.  Goal is to try to attain exercise frequency to 5 times a week.  If applicable: Best to perform resistance exercises (machines or weights) 2 days a week and cardio type exercises 3 days per week.  DM footcare in the AVS  Orders Placed This Encounter  Procedures  . CMP14+EGFR  . Lipid panel  . TSH  . Vit D  25 hydroxy (rtn osteoporosis  monitoring)  . POCT glycosylated hemoglobin (Hb A1C)   Meds ordered this encounter  Medications  . amLODipine (NORVASC) 5 MG tablet    Sig: Take 1 tablet (5 mg total) by mouth daily.    Dispense:  90 tablet    Refill:  3   continue same regimen.  Medications Discontinued During This Encounter  Medication Reason  . moxifloxacin (AVELOX) 400 MG tablet Completed Course  . amLODipine (NORVASC) 5 MG tablet Reorder   Return in about 4 months (around 04/05/2013) for Recheck medical problems.  Timiko Offutt P. [place] Charon, M.D.

## 2012-12-07 LAB — LIPID PANEL
Chol/HDL Ratio: 2.8 ratio units (ref 0.0–5.0)
Cholesterol, Total: 121 mg/dL (ref 100–199)
HDL: 44 mg/dL (ref >39)
LDL Calculated: 61 mg/dL (ref 0–99)
Triglycerides: 80 mg/dL (ref 0–149)
VLDL Cholesterol Cal: 16 mg/dL (ref 5–40)

## 2012-12-07 LAB — CMP14+EGFR
ALT: 16 IU/L (ref 0–44)
AST: 19 IU/L (ref 0–40)
Albumin/Globulin Ratio: 1.7 (ref 1.1–2.5)
Albumin: 4.3 g/dL (ref 3.5–4.8)
Alkaline Phosphatase: 48 IU/L (ref 39–117)
BUN/Creatinine Ratio: 11 (ref 10–22)
BUN: 11 mg/dL (ref 8–27)
CO2: 23 mmol/L (ref 18–29)
Calcium: 9.6 mg/dL (ref 8.6–10.2)
Chloride: 103 mmol/L (ref 97–108)
Creatinine, Ser: 1.03 mg/dL (ref 0.76–1.27)
GFR calc Af Amer: 80 mL/min/{1.73_m2} (ref >59)
GFR calc non Af Amer: 69 mL/min/{1.73_m2} (ref >59)
Globulin, Total: 2.5 g/dL (ref 1.5–4.5)
Glucose: 102 mg/dL — ABNORMAL HIGH (ref 65–99)
Potassium: 5.1 mmol/L (ref 3.5–5.2)
Sodium: 142 mmol/L (ref 134–144)
Total Bilirubin: 0.6 mg/dL (ref 0.0–1.2)
Total Protein: 6.8 g/dL (ref 6.0–8.5)

## 2012-12-07 LAB — VITAMIN D 25 HYDROXY (VIT D DEFICIENCY, FRACTURES): Vit D, 25-Hydroxy: 41.8 ng/mL (ref 30.0–100.0)

## 2012-12-07 LAB — TSH: TSH: 1.6 u[IU]/mL (ref 0.450–4.500)

## 2012-12-11 ENCOUNTER — Ambulatory Visit: Payer: Medicare Other | Admitting: Family Medicine

## 2013-04-09 ENCOUNTER — Encounter: Payer: Self-pay | Admitting: Family Medicine

## 2013-04-09 ENCOUNTER — Ambulatory Visit (INDEPENDENT_AMBULATORY_CARE_PROVIDER_SITE_OTHER): Payer: Medicare Other | Admitting: Family Medicine

## 2013-04-09 ENCOUNTER — Encounter (INDEPENDENT_AMBULATORY_CARE_PROVIDER_SITE_OTHER): Payer: Self-pay

## 2013-04-09 VITALS — BP 111/61 | HR 58 | Temp 97.2°F | Ht 68.0 in | Wt 188.2 lb

## 2013-04-09 DIAGNOSIS — E039 Hypothyroidism, unspecified: Secondary | ICD-10-CM

## 2013-04-09 DIAGNOSIS — I714 Abdominal aortic aneurysm, without rupture, unspecified: Secondary | ICD-10-CM

## 2013-04-09 DIAGNOSIS — E119 Type 2 diabetes mellitus without complications: Secondary | ICD-10-CM

## 2013-04-09 DIAGNOSIS — E785 Hyperlipidemia, unspecified: Secondary | ICD-10-CM

## 2013-04-09 DIAGNOSIS — E559 Vitamin D deficiency, unspecified: Secondary | ICD-10-CM

## 2013-04-09 DIAGNOSIS — I10 Essential (primary) hypertension: Secondary | ICD-10-CM

## 2013-04-09 DIAGNOSIS — Z87891 Personal history of nicotine dependence: Secondary | ICD-10-CM

## 2013-04-09 LAB — POCT GLYCOSYLATED HEMOGLOBIN (HGB A1C): Hemoglobin A1C: 6.3

## 2013-04-09 NOTE — Patient Instructions (Signed)
Diabetes and Foot Care Diabetes may cause you to have problems because of poor blood supply (circulation) to your feet and legs. This may cause the skin on your feet to become thinner, break easier, and heal more slowly. Your skin may become dry, and the skin may peel and crack. You may also have nerve damage in your legs and feet causing decreased feeling in them. You may not notice minor injuries to your feet that could lead to infections or more serious problems. Taking care of your feet is one of the most important things you can do for yourself.  HOME CARE INSTRUCTIONS  Wear shoes at all times, even in the house. Do not go barefoot. Bare feet are easily injured.  Check your feet daily for blisters, cuts, and redness. If you cannot see the bottom of your feet, use a mirror or ask someone for help.  Wash your feet with warm water (do not use hot water) and mild soap. Then pat your feet and the areas between your toes until they are completely dry. Do not soak your feet as this can dry your skin.  Apply a moisturizing lotion or petroleum jelly (that does not contain alcohol and is unscented) to the skin on your feet and to dry, brittle toenails. Do not apply lotion between your toes.  Trim your toenails straight across. Do not dig under them or around the cuticle. File the edges of your nails with an emery board or nail file.  Do not cut corns or calluses or try to remove them with medicine.  Wear clean socks or stockings every day. Make sure they are not too tight. Do not wear knee-high stockings since they may decrease blood flow to your legs.  Wear shoes that fit properly and have enough cushioning. To break in new shoes, wear them for just a few hours a day. This prevents you from injuring your feet. Always look in your shoes before you put them on to be sure there are no objects inside.  Do not cross your legs. This may decrease the blood flow to your feet.  If you find a minor scrape,  cut, or break in the skin on your feet, keep it and the skin around it clean and dry. These areas may be cleansed with mild soap and water. Do not cleanse the area with peroxide, alcohol, or iodine.  When you remove an adhesive bandage, be sure not to damage the skin around it.  If you have a wound, look at it several times a day to make sure it is healing.  Do not use heating pads or hot water bottles. They may burn your skin. If you have lost feeling in your feet or legs, you may not know it is happening until it is too late.  Make sure your health care provider performs a complete foot exam at least annually or more often if you have foot problems. Report any cuts, sores, or bruises to your health care provider immediately. SEEK MEDICAL CARE IF:   You have an injury that is not healing.  You have cuts or breaks in the skin.  You have an ingrown nail.  You notice redness on your legs or feet.  You feel burning or tingling in your legs or feet.  You have pain or cramps in your legs and feet.  Your legs or feet are numb.  Your feet always feel cold. SEEK IMMEDIATE MEDICAL CARE IF:   There is increasing redness,   swelling, or pain in or around a wound.  There is a red line that goes up your leg.  Pus is coming from a wound.  You develop a fever or as directed by your health care provider.  You notice a bad smell coming from an ulcer or wound. Document Released: 01/15/2000 Document Revised: 09/19/2012 Document Reviewed: 06/26/2012 [employer] Patient Information 2014 [employer].  

## 2013-04-09 NOTE — Progress Notes (Signed)
Patient ID: Dillon Sweeney, male   DOB: 11/18/1932, 80 y.o.   MRN: [id number] SUBJECTIVE: CC: Chief Complaint  Patient presents with  . Follow-up    4 month follow up c/o cold symptoms cough -clear sputum    HPI: Patient is here for follow up of Diabetes Mellitus/HLD/HTN/Vitamin D def/: Symptoms evaluated: Denies Nocturia ,Denies Urinary Frequency , denies Blurred vision ,deniesDizziness,denies.Dysuria,denies paresthesias, denies extremity pain or ulcers.Marland Kitchendenies chest pain. has had an annual eye exam. do check the feet. Does check CBGs. Average CBG:not checking not had a problem Denies episodes of hypoglycemia. Does have an emergency hypoglycemic plan. admits toCompliance with medications. Denies Problems with medications.   Has had a cold and cough. For 1 week now. No chest pain. No fever. No hemoptysis.  Past Medical History  Diagnosis Date  . Chronic constipation   . Hypertension   . Dyslipidemia   . Hypothyroidism   . Diabetes mellitus     Type 2  . CAD (coronary artery disease)   . AAA (abdominal aortic aneurysm)   . Pneumonia 2012  . Atherosclerosis   . Myocardial infarction 09/22/10    1986  . Cholelithiasis   . Bladder stones   . Hyperlipidemia    Past Surgical History  Procedure Laterality Date  . Appendectomy    . Tonsillectomy    . Coronary artery bypass graft  1986    left leg vein harvest  . Abdominal aortic aneurysm repair  09/15/10    Juxtarenal AAA repair    History   Social History  . Marital Status: Married    Spouse Name: N/A    Number of Children: N/A  . Years of Education: N/A   Occupational History  . Not on file.   Social History Main Topics  . Smoking status: Former Smoker -- 1.00 packs/day    Types: Cigarettes    Quit date: 09/10/2010  . Smokeless tobacco: Not on file  . Alcohol Use: Yes     Comment: occasionally  . Drug Use: No  . Sexual Activity:    Other Topics Concern  . Not on file   Social History Narrative  .  No narrative on file   Family History  Problem Relation Age of Onset  . Stroke Father   . Cancer Sister    Current Outpatient Prescriptions on File Prior to Visit  Medication Sig Dispense Refill  . Acetaminophen (TYLENOL PO) Take 1-2 tablets by mouth every 6 (six) hours as needed.        Marland Kitchen amLODipine (NORVASC) 5 MG tablet Take 1 tablet (5 mg total) by mouth daily.  90 tablet  3  . aspirin 81 MG tablet Take 81 mg by mouth daily.        . benazepril (LOTENSIN) 40 MG tablet Take 1 tablet (40 mg total) by mouth daily.  90 tablet  3  . fluticasone (FLONASE) 50 MCG/ACT nasal spray Place 2 sprays into the nose daily.  48 g  1  . levothyroxine (SYNTHROID, LEVOTHROID) 150 MCG tablet Take 1 tablet (150 mcg total) by mouth daily before breakfast.  90 tablet  3  . metoprolol (LOPRESSOR) 100 MG tablet TAKE ONE-HALF TABLET BY MOUTH TWICE DAILY  90 tablet  3  . rosuvastatin (CRESTOR) 40 MG tablet TAKE ONE TABLET BY MOUTH EVERY DAY  90 tablet  3  . calcium-vitamin D (OSCAL) 250-125 MG-UNIT per tablet Take 1 tablet by mouth daily.        . famotidine (PEPCID)  20 MG tablet Take 20 mg by mouth 2 (two) times daily.        . metoprolol (TOPROL-XL) 50 MG 24 hr tablet Take 50 mg by mouth 2 (two) times daily.        . multivitamin (THERAGRAN) per tablet Take 1 tablet by mouth daily.        Marland Kitchen oxyCODONE-acetaminophen (PERCOCET) 10-325 MG per tablet Take 1 tablet by mouth every 4 (four) hours as needed.         No current facility-administered medications on file prior to visit.   Allergies  Allergen Reactions  . Morphine And Related     hallucinations   Immunization History  Administered Date(s) Administered  . Influenza,inj,Quad PF,36+ Mos 12/06/2012  . Pneumococcal Polysaccharide-23 08/02/2011  . Tdap 08/02/2011   Prior to Admission medications   Medication Sig Start Date End Date Taking? Authorizing Provider  Acetaminophen (TYLENOL PO) Take 1-2 tablets by mouth every 6 (six) hours as needed.     Yes  Historical Provider, MD  amLODipine (NORVASC) 5 MG tablet Take 1 tablet (5 mg total) by mouth daily. 12/06/12  Yes Vernie Shanks, MD  aspirin 81 MG tablet Take 81 mg by mouth daily.     Yes Historical Provider, MD  benazepril (LOTENSIN) 40 MG tablet Take 1 tablet (40 mg total) by mouth daily. 08/06/12  Yes Vernie Shanks, MD  Ferrous Sulfate 27 MG TABS Take 1 tablet by mouth daily.   Yes Historical Provider, MD  fluticasone (FLONASE) 50 MCG/ACT nasal spray Place 2 sprays into the nose daily. 08/06/12  Yes Vernie Shanks, MD  levothyroxine (SYNTHROID, LEVOTHROID) 150 MCG tablet Take 1 tablet (150 mcg total) by mouth daily before breakfast. 08/06/12  Yes Vernie Shanks, MD  metFORMIN (GLUCOPHAGE) 500 MG tablet Take by mouth. 1/2 tablet with breakfast 08/06/12  Yes Vernie Shanks, MD  metoprolol (LOPRESSOR) 100 MG tablet TAKE ONE-HALF TABLET BY MOUTH TWICE DAILY 08/06/12  Yes Vernie Shanks, MD  rosuvastatin (CRESTOR) 40 MG tablet TAKE ONE TABLET BY MOUTH EVERY DAY 08/06/12  Yes Vernie Shanks, MD  calcium-vitamin D (OSCAL) 250-125 MG-UNIT per tablet Take 1 tablet by mouth daily.      Historical Provider, MD  famotidine (PEPCID) 20 MG tablet Take 20 mg by mouth 2 (two) times daily.      Historical Provider, MD  metoprolol (TOPROL-XL) 50 MG 24 hr tablet Take 50 mg by mouth 2 (two) times daily.      Historical Provider, MD  multivitamin [employer]) per tablet Take 1 tablet by mouth daily.      Historical Provider, MD  oxyCODONE-acetaminophen (PERCOCET) 10-325 MG per tablet Take 1 tablet by mouth every 4 (four) hours as needed.      Historical Provider, MD     ROS: As above in the HPI. All other systems are stable or negative.  OBJECTIVE: APPEARANCE:  Patient in no acute distress.The patient appeared well nourished and normally developed. Acyanotic. Waist: VITAL SIGNS:BP 111/61  Pulse 58  Temp(Src) 97.2 F (36.2 C) (Oral)  Ht 5' 8" (1.727 m)  Wt 188 lb 3.2 oz (85.367 kg)  BMI 28.62 kg/m2 WM  SKIN:  warm and  Dry without overt rashes, tattoos and scars  HEAD and Neck: without JVD, Head and scalp: normal Eyes:No scleral icterus. Fundi normal, eye movements normal. Ears: Auricle normal, canal normal, Tympanic membranes normal, insufflation normal. Nose: normal Throat: normal Neck & thyroid: normal  CHEST & LUNGS: Chest wall:  normal Lungs: Clear  CVS: Reveals the PMI to be normally located. Regular rhythm, First and Second Heart sounds are normal,  absence of murmurs, rubs or gallops. Peripheral vasculature: Radial pulses: normal Dorsal pedis pulses: normal Posterior pulses: normal  ABDOMEN:  Appearance:Benign, no organomegaly, no masses, no Abdominal Aortic enlargement. No Guarding , no rebound. No pulsatile masses. Bowel sounds: normal  RECTAL: N/A GU: N/A  EXTREMETIES: nonedematous.  MUSCULOSKELETAL:  Spine: normal Joints: intact  NEUROLOGIC: oriented to time,place and person; nonfocal. Strength is normal Sensory is normal Reflexes are normal Cranial Nerves are normal.  Results for orders placed in visit on 04/09/13  POCT GLYCOSYLATED HEMOGLOBIN (HGB A1C)      Result Value Ref Range   Hemoglobin A1C 6.3       ASSESSMENT:  Diabetes - Plan: POCT glycosylated hemoglobin (Hb A1C)  Unspecified vitamin D deficiency - Plan: Vit D  25 hydroxy (rtn osteoporosis monitoring)  Unspecified hypothyroidism - Plan: TSH  Former smoker  Hyperlipidemia - Plan: CMP14+EGFR, Lipid panel  HTN (hypertension) - Plan: CMP14+EGFR  Abdominal aneurysm without mention of rupture - repaired  PLAN:  DM foot care and well ness discussed.  Orders Placed This Encounter  Procedures  . CMP14+EGFR  . Lipid panel  . TSH  . Vit D  25 hydroxy (rtn osteoporosis monitoring)  . POCT glycosylated hemoglobin (Hb A1C)   Meds ordered this encounter  Medications  . metFORMIN (GLUCOPHAGE) 500 MG tablet    Sig: Take by mouth. 1/2 tablet with breakfast  . Ferrous Sulfate 27 MG TABS     Sig: Take 1 tablet by mouth daily.   Medications Discontinued During This Encounter  Medication Reason  . metFORMIN (GLUCOPHAGE) 500 MG tablet    Return in about 4 months (around 08/09/2013) for Recheck medical problems.  Alyene Predmore P. Jacelyn Grip, M.D.

## 2013-04-10 ENCOUNTER — Other Ambulatory Visit: Payer: Self-pay | Admitting: Family Medicine

## 2013-04-10 DIAGNOSIS — E039 Hypothyroidism, unspecified: Secondary | ICD-10-CM

## 2013-04-10 LAB — CMP14+EGFR
ALT: 15 IU/L (ref 0–44)
AST: 17 IU/L (ref 0–40)
Albumin/Globulin Ratio: 1.7 (ref 1.1–2.5)
Albumin: 4.3 g/dL (ref 3.5–4.8)
Alkaline Phosphatase: 55 IU/L (ref 39–117)
BUN/Creatinine Ratio: 13 (ref 10–22)
BUN: 14 mg/dL (ref 8–27)
CO2: 21 mmol/L (ref 18–29)
Calcium: 9.4 mg/dL (ref 8.6–10.2)
Chloride: 98 mmol/L (ref 97–108)
Creatinine, Ser: 1.04 mg/dL (ref 0.76–1.27)
GFR calc Af Amer: 79 mL/min/{1.73_m2} (ref >59)
GFR calc non Af Amer: 68 mL/min/{1.73_m2} (ref >59)
Globulin, Total: 2.6 g/dL (ref 1.5–4.5)
Glucose: 112 mg/dL — ABNORMAL HIGH (ref 65–99)
Potassium: 4.3 mmol/L (ref 3.5–5.2)
Sodium: 137 mmol/L (ref 134–144)
Total Bilirubin: 0.6 mg/dL (ref 0.0–1.2)
Total Protein: 6.9 g/dL (ref 6.0–8.5)

## 2013-04-10 LAB — LIPID PANEL
Chol/HDL Ratio: 3.6 ratio units (ref 0.0–5.0)
Cholesterol, Total: 103 mg/dL (ref 100–199)
HDL: 29 mg/dL — ABNORMAL LOW (ref >39)
LDL Calculated: 55 mg/dL (ref 0–99)
Triglycerides: 93 mg/dL (ref 0–149)
VLDL Cholesterol Cal: 19 mg/dL (ref 5–40)

## 2013-04-10 LAB — TSH: TSH: 0.294 u[IU]/mL — ABNORMAL LOW (ref 0.450–4.500)

## 2013-04-10 LAB — VITAMIN D 25 HYDROXY (VIT D DEFICIENCY, FRACTURES): Vit D, 25-Hydroxy: 48 ng/mL (ref 30.0–100.0)

## 2013-04-10 MED ORDER — LEVOTHYROXINE SODIUM 137 MCG PO TABS: 137.0000 ug | ORAL_TABLET | Freq: Every day | ORAL | Status: DC

## 2013-04-10 NOTE — Progress Notes (Signed)
Quick Note:  Call Patient Labs that are abnormal: Thyroid test reflects too much thyroid medication  The rest are at goal: actually looks good.  Recommendations: Need to reduce the dose of the thyroid medication. This was ordered in [facility]. TSH will need to be rechecked in 6 weeks, ordered in EPIC as well.   ______

## 2013-05-21 ENCOUNTER — Telehealth: Payer: Self-pay | Admitting: Family Medicine

## 2013-05-26 ENCOUNTER — Other Ambulatory Visit: Payer: Self-pay | Admitting: Family Medicine

## 2013-05-26 MED ORDER — BENZONATATE 200 MG PO CAPS: 200.0000 mg | ORAL_CAPSULE | Freq: Three times a day (TID) | ORAL | Status: DC | PRN

## 2013-05-26 NOTE — Telephone Encounter (Signed)
Call patient : Prescription refilled & sent to pharmacy in [facility]. 

## 2013-05-27 ENCOUNTER — Encounter: Payer: Self-pay | Admitting: Family Medicine

## 2013-05-27 ENCOUNTER — Ambulatory Visit (INDEPENDENT_AMBULATORY_CARE_PROVIDER_SITE_OTHER): Payer: Medicare Other | Admitting: Family Medicine

## 2013-05-27 ENCOUNTER — Ambulatory Visit (INDEPENDENT_AMBULATORY_CARE_PROVIDER_SITE_OTHER): Payer: Medicare Other

## 2013-05-27 VITALS — BP 134/76 | HR 57 | Temp 96.9°F | Ht 68.0 in | Wt 182.4 lb

## 2013-05-27 DIAGNOSIS — J209 Acute bronchitis, unspecified: Secondary | ICD-10-CM

## 2013-05-27 DIAGNOSIS — I714 Abdominal aortic aneurysm, without rupture, unspecified: Secondary | ICD-10-CM

## 2013-05-27 DIAGNOSIS — E559 Vitamin D deficiency, unspecified: Secondary | ICD-10-CM

## 2013-05-27 DIAGNOSIS — R05 Cough: Secondary | ICD-10-CM

## 2013-05-27 DIAGNOSIS — R059 Cough, unspecified: Secondary | ICD-10-CM

## 2013-05-27 DIAGNOSIS — E119 Type 2 diabetes mellitus without complications: Secondary | ICD-10-CM

## 2013-05-27 DIAGNOSIS — R062 Wheezing: Secondary | ICD-10-CM

## 2013-05-27 DIAGNOSIS — F17200 Nicotine dependence, unspecified, uncomplicated: Secondary | ICD-10-CM

## 2013-05-27 DIAGNOSIS — E039 Hypothyroidism, unspecified: Secondary | ICD-10-CM

## 2013-05-27 DIAGNOSIS — I10 Essential (primary) hypertension: Secondary | ICD-10-CM

## 2013-05-27 DIAGNOSIS — E785 Hyperlipidemia, unspecified: Secondary | ICD-10-CM

## 2013-05-27 MED ORDER — ALBUTEROL SULFATE HFA 108 (90 BASE) MCG/ACT IN AERS: 2.0000 | INHALATION_SPRAY | Freq: Four times a day (QID) | RESPIRATORY_TRACT | Status: DC | PRN

## 2013-05-27 MED ORDER — LEVOFLOXACIN 500 MG PO TABS: 500.0000 mg | ORAL_TABLET | Freq: Every day | ORAL | Status: DC

## 2013-05-27 NOTE — Progress Notes (Signed)
Patient ID: Dillon Sweeney, male   DOB: 1932-11-19, 80 y.o.   MRN: [id number] SUBJECTIVE: CC: Chief Complaint  Patient presents with  . Cough    been coughing for 3 week.  states levothyroixine was changed to 137 mcg caused headaches  and pain ikn neck so he stated he wwent back on 150 mcg and no furhter pain     HPI: Started 3 weeks ago. As a dry cough then it progressed with some wheezing. Sleeps on one pillow at night. Then has had chills and sometimes when he coughs he breaks out in a sweat. Phlegm getting dark.no blood. No recent  Travel. Sugars are stable. No pedal edema. No chest pain, no leg pains, no orthopnea, no palpitations. Doesn't smoke anymore for a long time.   Past Medical History  Diagnosis Date  . Chronic constipation   . Hypertension   . Dyslipidemia   . Hypothyroidism   . Diabetes mellitus     Type 2  . CAD (coronary artery disease)   . AAA (abdominal aortic aneurysm)   . Pneumonia 2012  . Atherosclerosis   . Myocardial infarction 09/22/10    1986  . Cholelithiasis   . Bladder stones   . Hyperlipidemia    Past Surgical History  Procedure Laterality Date  . Appendectomy    . Tonsillectomy    . Coronary artery bypass graft  1986    left leg vein harvest  . Abdominal aortic aneurysm repair  09/15/10    Juxtarenal AAA repair    History   Social History  . Marital Status: Married    Spouse Name: N/A    Number of Children: N/A  . Years of Education: N/A   Occupational History  . Not on file.   Social History Main Topics  . Smoking status: Former Smoker -- 1.00 packs/day    Types: Cigarettes    Quit date: 09/10/2010  . Smokeless tobacco: Not on file  . Alcohol Use: Yes     Comment: occasionally  . Drug Use: No  . Sexual Activity:    Other Topics Concern  . Not on file   Social History Narrative  . No narrative on file   Family History  Problem Relation Age of Onset  . Stroke Father   . Cancer Sister    Current Outpatient  Prescriptions on File Prior to Visit  Medication Sig Dispense Refill  . Acetaminophen (TYLENOL PO) Take 1-2 tablets by mouth every 6 (six) hours as needed.        Marland Kitchen amLODipine (NORVASC) 5 MG tablet Take 1 tablet (5 mg total) by mouth daily.  90 tablet  3  . aspirin 81 MG tablet Take 81 mg by mouth daily.        . benazepril (LOTENSIN) 40 MG tablet Take 1 tablet (40 mg total) by mouth daily.  90 tablet  3  . benzonatate (TESSALON) 200 MG capsule Take 1 capsule (200 mg total) by mouth 3 (three) times daily as needed for cough.  30 capsule  0  . calcium-vitamin D (OSCAL) 250-125 MG-UNIT per tablet Take 1 tablet by mouth daily.        . famotidine (PEPCID) 20 MG tablet Take 20 mg by mouth 2 (two) times daily.        . Ferrous Sulfate 27 MG TABS Take 1 tablet by mouth daily.      . fluticasone (FLONASE) 50 MCG/ACT nasal spray Place 2 sprays into the nose daily.  48 g  1  . levothyroxine (SYNTHROID, LEVOTHROID) 137 MCG tablet Take 1 tablet (137 mcg total) by mouth daily before breakfast.  30 tablet  3  . metFORMIN (GLUCOPHAGE) 500 MG tablet Take by mouth. 1/2 tablet with breakfast      . metoprolol (LOPRESSOR) 100 MG tablet TAKE ONE-HALF TABLET BY MOUTH TWICE DAILY  90 tablet  3  . metoprolol (TOPROL-XL) 50 MG 24 hr tablet Take 50 mg by mouth 2 (two) times daily.        . multivitamin (THERAGRAN) per tablet Take 1 tablet by mouth daily.        Marland Kitchen oxyCODONE-acetaminophen (PERCOCET) 10-325 MG per tablet Take 1 tablet by mouth every 4 (four) hours as needed.        . rosuvastatin (CRESTOR) 40 MG tablet TAKE ONE TABLET BY MOUTH EVERY DAY  90 tablet  3   No current facility-administered medications on file prior to visit.   Allergies  Allergen Reactions  . Morphine And Related     hallucinations   Immunization History  Administered Date(s) Administered  . Influenza,inj,Quad PF,36+ Mos 12/06/2012  . Pneumococcal Polysaccharide-23 08/02/2011  . Tdap 08/02/2011   Prior to Admission medications    Medication Sig Start Date End Date Taking? Authorizing Provider  Acetaminophen (TYLENOL PO) Take 1-2 tablets by mouth every 6 (six) hours as needed.      Historical Provider, MD  amLODipine (NORVASC) 5 MG tablet Take 1 tablet (5 mg total) by mouth daily. 12/06/12   Vernie Shanks, MD  aspirin 81 MG tablet Take 81 mg by mouth daily.      Historical Provider, MD  benazepril (LOTENSIN) 40 MG tablet Take 1 tablet (40 mg total) by mouth daily. 08/06/12   Vernie Shanks, MD  benzonatate (TESSALON) 200 MG capsule Take 1 capsule (200 mg total) by mouth 3 (three) times daily as needed for cough. 05/26/13   Vernie Shanks, MD  calcium-vitamin D (OSCAL) 250-125 MG-UNIT per tablet Take 1 tablet by mouth daily.      Historical Provider, MD  famotidine (PEPCID) 20 MG tablet Take 20 mg by mouth 2 (two) times daily.      Historical Provider, MD  Ferrous Sulfate 27 MG TABS Take 1 tablet by mouth daily.    Historical Provider, MD  fluticasone (FLONASE) 50 MCG/ACT nasal spray Place 2 sprays into the nose daily. 08/06/12   Vernie Shanks, MD  levothyroxine (SYNTHROID, LEVOTHROID) 137 MCG tablet Take 1 tablet (137 mcg total) by mouth daily before breakfast. 04/10/13   Vernie Shanks, MD  metFORMIN (GLUCOPHAGE) 500 MG tablet Take by mouth. 1/2 tablet with breakfast 08/06/12   Vernie Shanks, MD  metoprolol (LOPRESSOR) 100 MG tablet TAKE ONE-HALF TABLET BY MOUTH TWICE DAILY 08/06/12   Vernie Shanks, MD  metoprolol (TOPROL-XL) 50 MG 24 hr tablet Take 50 mg by mouth 2 (two) times daily.      Historical Provider, MD  multivitamin [facility]) per tablet Take 1 tablet by mouth daily.      Historical Provider, MD  oxyCODONE-acetaminophen (PERCOCET) 10-325 MG per tablet Take 1 tablet by mouth every 4 (four) hours as needed.      Historical Provider, MD  rosuvastatin (CRESTOR) 40 MG tablet TAKE ONE TABLET BY MOUTH EVERY DAY 08/06/12   Vernie Shanks, MD     ROS: As above in the HPI. All other systems are stable or  negative.  OBJECTIVE: APPEARANCE:  Patient in no acute distress.The  patient appeared well nourished and normally developed. Acyanotic. Waist: VITAL SIGNS:BP 134/76  Pulse 57  Temp(Src) 96.9 F (36.1 C) (Oral)  Ht 5\' 8"  (1.727 m)  Wt 182 lb 6.4 oz (82.736 kg)  BMI 27.74 kg/m2  SpO2 93% Elderly WM NAD Wet rattling cough  SKIN: warm and  Dry without overt rashes, tattoos and scars  HEAD and Neck: without JVD, Head and scalp: normal Eyes:No scleral icterus. Fundi normal, eye movements normal. Ears: Auricle normal, canal normal, Tympanic membranes normal, insufflation normal. Nose: normal Throat: normal Neck & thyroid: normal  CHEST & LUNGS: Chest wall: normal Lungs: Coarse BS and rhonchi in bases.  CVS: Reveals the PMI to be normally located. Regular rhythm, First and Second Heart sounds are normal,  absence of murmurs, rubs or gallops. Peripheral vasculature: Radial pulses: normal Dorsal pedis pulses: normal Posterior pulses: normal  ABDOMEN:  Appearance: normal Benign, no organomegaly, no masses, no Abdominal Aortic enlargement. No Guarding , no rebound. No Bruits. Bowel sounds: normal  RECTAL: N/A GU: N/A  EXTREMETIES: nonedematous.  MUSCULOSKELETAL:  Spine: normal Joints: intact homan's negative.  NEUROLOGIC: oriented to time,place and person; nonfocal. Strength is normal Sensory is normal Reflexes are normal Cranial Nerves are normal.  ASSESSMENT: Cough - Plan: DG Chest 2 View  Unspecified vitamin D deficiency  Unspecified hypothyroidism  Tobacco use disorder  Hyperlipidemia  HTN (hypertension)  Diabetes  Abdominal aneurysm without mention of rupture  Wheeze - Plan: albuterol (PROVENTIL HFA;VENTOLIN HFA) 108 (90 BASE) MCG/ACT inhaler  Acute bronchitis - Plan: levofloxacin (LEVAQUIN) 500 MG tablet  PLAN:  Orders Placed This Encounter  Procedures  . DG Chest 2 View    Standing Status: Future     Number of Occurrences: 1      Standing Expiration Date: 07/27/2014    Order Specific Question:  Reason for Exam (SYMPTOM  OR DIAGNOSIS REQUIRED)    Answer:  cough    Order Specific Question:  Preferred imaging location?    Answer:  Internal  [facility] reading (PRIMARY) by  Dr.Gray Maugeri: increased markings, no consolidation, no acute problems                               Meds ordered this encounter  Medications  . levofloxacin (LEVAQUIN) 500 MG tablet    Sig: Take 1 tablet (500 mg total) by mouth daily.    Dispense:  7 tablet    Refill:  0  . albuterol (PROVENTIL HFA;VENTOLIN HFA) 108 (90 BASE) MCG/ACT inhaler    Sig: Inhale 2 puffs into the lungs every 6 (six) hours as needed for wheezing or shortness of breath.    Dispense:  1 Inhaler    Refill:  0  get the tessalon perles that were called in.   There are no discontinued medications. Return in about 3 days (around 05/30/2013) for Recheck medical problems.  Coolidge Gossard P. Jacelyn Grip, M.D.

## 2013-05-27 NOTE — Patient Instructions (Signed)
Acute Bronchitis Bronchitis is inflammation of the airways that extend from the windpipe into the lungs (bronchi). The inflammation often causes mucus to develop. This leads to a cough, which is the most common symptom of bronchitis.  In acute bronchitis, the condition usually develops suddenly and goes away over time, usually in a couple weeks. Smoking, allergies, and asthma can make bronchitis worse. Repeated episodes of bronchitis may cause further lung problems.  CAUSES Acute bronchitis is most often caused by the same virus that causes a cold. The virus can spread from person to person (contagious).  SIGNS AND SYMPTOMS   Cough.   Fever.   Coughing up mucus.   Body aches.   Chest congestion.   Chills.   Shortness of breath.   Sore throat.  DIAGNOSIS  Acute bronchitis is usually diagnosed through a physical exam. Tests, such as chest X-rays, are sometimes done to rule out other conditions.  TREATMENT  Acute bronchitis usually goes away in a couple weeks. Often times, no medical treatment is necessary. Medicines are sometimes given for relief of fever or cough. Antibiotics are usually not needed but may be prescribed in certain situations. In some cases, an inhaler may be recommended to help reduce shortness of breath and control the cough. A cool mist vaporizer may also be used to help thin bronchial secretions and make it easier to clear the chest.  HOME CARE INSTRUCTIONS  Get plenty of rest.   Drink enough fluids to keep your urine clear or pale yellow (unless you have a medical condition that requires fluid restriction). Increasing fluids may help thin your secretions and will prevent dehydration.   Only take over-the-counter or prescription medicines as directed by your health care provider.   Avoid smoking and secondhand smoke. Exposure to cigarette smoke or irritating chemicals will make bronchitis worse. If you are a smoker, consider using nicotine gum or skin  patches to help control withdrawal symptoms. Quitting smoking will help your lungs heal faster.   Reduce the chances of another bout of acute bronchitis by washing your hands frequently, avoiding people with cold symptoms, and trying not to touch your hands to your mouth, nose, or eyes.   Follow up with your health care provider as directed.  SEEK MEDICAL CARE IF: Your symptoms do not improve after 1 week of treatment.  SEEK IMMEDIATE MEDICAL CARE IF:  You develop an increased fever or chills.   You have chest pain.   You have severe shortness of breath.  You have bloody sputum.   You develop dehydration.  You develop fainting.  You develop repeated vomiting.  You develop a severe headache. MAKE SURE YOU:   Understand these instructions.  Will watch your condition.  Will get help right away if you are not doing well or get worse. Document Released: 02/25/2004 Document Revised: 09/19/2012 Document Reviewed: 07/10/2012 [facility] Patient Information 2014 [place].   Bronchospasm, Adult A bronchospasm is when the tubes that carry air in and out of your lungs (airwarys) spasm or tighten. During a bronchospasm it is hard to breathe. This is because the airways get smaller. A bronchospasm can be triggered by:  Allergies. These may be to animals, pollen, food, or mold.  Infection. This is a common cause of bronchospasm.  Exercise.  Irritants. These include pollution, cigarette smoke, strong odors, aerosol sprays, and paint fumes.  Weather changes.  Stress.  Being emotional. HOME CARE   Always have a plan for getting help. Know when to call your  doctor and local emergency services (911 in the [place] Know where you can get emergency care.  Only take medicines as told by your doctor.  If you were prescribed an inhaler or nebulizer machine, ask your doctor how to use it correctly. Always use a spacer with your inhaler if you were given one.  Stay calm during  an attack. Try to relax and breathe more slowly.  Control your home environment:  Change your heating and air conditioning filter at least once a month.  Limit your use of fireplaces and wood stoves.  Do not  smoke. Do not  allow smoking in your home.  Avoid perfumes and fragrances.  Get rid of pests (such as roaches and mice) and their droppings.  Throw away plants if you see mold on them.  Keep your house clean and dust free.  Replace carpet with wood, tile, or vinyl flooring. Carpet can trap dander and dust.  Use allergy-proof pillows, mattress covers, and box spring covers.  Wash bed sheets and blankets every week in hot water. Dry them in a dryer.  Use blankets that are made of polyester or cotton.  Wash hands frequently. GET HELP IF:  You have muscle aches.  You have chest pain.  The thick spit you spit or cough up (sputum) changes from clear or white to yellow, green, gray, or bloody.  The thick spit you spit or cough up gets thicker.  There are problems that may be related to the medicine you are given such as:  A rash.  Itching.  Swelling.  Trouble breathing. GET HELP RIGHT AWAY IF:  You feel you cannot breathe or catch your breath.  You cannot stop coughing.  Your treatment is not helping you breathe better. MAKE SURE YOU:   Understand these instructions.  Will watch your condition.  Will get help right away if you are not doing well or get worse. Document Released: 11/14/2008 Document Revised: 09/19/2012 Document Reviewed: 07/10/2012 [facility] Patient Information 2014 [place].

## 2013-05-30 ENCOUNTER — Ambulatory Visit (INDEPENDENT_AMBULATORY_CARE_PROVIDER_SITE_OTHER): Payer: Medicare Other | Admitting: Family

## 2013-05-30 ENCOUNTER — Encounter: Payer: Self-pay | Admitting: Family

## 2013-05-30 VITALS — BP 136/73 | HR 58 | Temp 97.5°F | Ht 68.0 in | Wt 184.8 lb

## 2013-05-30 DIAGNOSIS — J209 Acute bronchitis, unspecified: Secondary | ICD-10-CM

## 2013-05-30 NOTE — Progress Notes (Signed)
   Subjective:    Patient ID: Dillon Sweeney, male    DOB: November 17, 1932, 80 y.o.   MRN: [id number]  Cough Associated symptoms include shortness of breath. Pertinent negatives include no wheezing.   Pt here for follow-up for bronchitis. Pt was seen in office on Monday and was prescribed tessalon pearls, levaquin, and an albuterol inhaler. Pt states he has used the inhaler twice a day. States he is breathing better, but still feels fatigued.   Review of Systems  Constitutional: Negative for unexpected weight change.  HENT: Negative.   Respiratory: Positive for cough and shortness of breath. Negative for chest tightness and wheezing.   Gastrointestinal: Negative.   Genitourinary: Negative.   Musculoskeletal: Negative.   Skin: Negative.   All other systems reviewed and are negative.      Objective:   Physical Exam  Vitals reviewed. Constitutional: He is oriented to person, place, and time. He appears well-developed and well-nourished. No distress.  HENT:  Head: Normocephalic.  Right Ear: External ear normal.  Left Ear: External ear normal.  Mouth/Throat: Oropharynx is clear and moist.  Eyes: Pupils are equal, round, and reactive to light. Right eye exhibits no discharge. Left eye exhibits no discharge.  Neck: Normal range of motion. Neck supple. No thyromegaly present.  Cardiovascular: Normal rate and intact distal pulses.   No murmur heard. Bradycardia   Pulmonary/Chest: Effort normal. No respiratory distress. He has wheezes.  Nonproductive dry, cough  Abdominal: Soft. Bowel sounds are normal. He exhibits no distension. There is no tenderness.  Musculoskeletal: Normal range of motion. He exhibits no edema and no tenderness.  Neurological: He is alert and oriented to person, place, and time.  Skin: Skin is warm and dry. No rash noted. No erythema.  Psychiatric: He has a normal mood and affect. His behavior is normal. Judgment and thought content normal.    BP 136/73  Pulse 58   Temp(Src) 97.5 F (36.4 C) (Oral)  Ht 5\' 8"  (1.727 m)  Wt 184 lb 12.8 oz (83.825 kg)  BMI 28.11 kg/m2  SpO2 94%       Assessment & Plan:  1. Acute bronchitis -Continue all medications -Force Fluids -Rest respiratory precautions (mask if doing yard work, Sport and exercise psychologist) -After completion of tessalon pearls start on Mucinex 1200mg  every 12 hours daily -Discussed chest-xray changes -RTO in 2-3 weeks or if s/s worsens  Evelina Dun, FNP

## 2013-05-30 NOTE — Patient Instructions (Addendum)
Bronchitis Bronchitis is inflammation of the airways that extend from the windpipe into the lungs (bronchi). The inflammation often causes mucus to develop, which leads to a cough. If the inflammation becomes severe, it may cause shortness of breath. CAUSES  Bronchitis may be caused by:   Viral infections.   Bacteria.   Cigarette smoke.   Allergens, pollutants, and other irritants.  SIGNS AND SYMPTOMS  The most common symptom of bronchitis is a frequent cough that produces mucus. Other symptoms include:  Fever.   Body aches.   Chest congestion.   Chills.   Shortness of breath.   Sore throat.  DIAGNOSIS  Bronchitis is usually diagnosed through a medical history and physical exam. Tests, such as chest X-rays, are sometimes done to rule out other conditions.  TREATMENT  You may need to avoid contact with whatever caused the problem (smoking, for example). Medicines are sometimes needed. These may include:  Antibiotics. These may be prescribed if the condition is caused by bacteria.  Cough suppressants. These may be prescribed for relief of cough symptoms.   Inhaled medicines. These may be prescribed to help open your airways and make it easier for you to breathe.   Steroid medicines. These may be prescribed for those with recurrent (chronic) bronchitis. HOME CARE INSTRUCTIONS  Get plenty of rest.   Drink enough fluids to keep your urine clear or pale yellow (unless you have a medical condition that requires fluid restriction). Increasing fluids may help thin your secretions and will prevent dehydration.   Only take over-the-counter or prescription medicines as directed by your health care provider.  Only take antibiotics as directed. Make sure you finish them even if you start to feel better.  Avoid secondhand smoke, irritating chemicals, and strong fumes. These will make bronchitis worse. If you are a smoker, quit smoking. Consider using nicotine gum or  skin patches to help control withdrawal symptoms. Quitting smoking will help your lungs heal faster.   Put a cool-mist humidifier in your bedroom at night to moisten the air. This may help loosen mucus. Change the water in the humidifier daily. You can also run the hot water in your shower and sit in the bathroom with the door closed for 5 10 minutes.   Follow up with your health care provider as directed.   Wash your hands frequently to avoid catching bronchitis again or spreading an infection to others.  SEEK MEDICAL CARE IF: Your symptoms do not improve after 1 week of treatment.  SEEK IMMEDIATE MEDICAL CARE IF:  Your fever increases.  You have chills.   You have chest pain.   You have worsening shortness of breath.   You have bloody sputum.  You faint.  You have lightheadedness.  You have a severe headache.   You vomit repeatedly. MAKE SURE YOU:   Understand these instructions.  Will watch your condition.  Will get help right away if you are not doing well or get worse. Document Released: 01/17/2005 Document Revised: 11/07/2012 Document Reviewed: 09/11/2012 [facility] Patient Information 2014 [place] all medications -Force Fluids respiratory precautions (mask if doing yard work, Sport and exercise psychologist) -After completion of tessalon pearls start on Mucinex 1200mg  every 12 hours daily   Evelina Dun, [place]

## 2013-06-13 ENCOUNTER — Encounter: Payer: Self-pay | Admitting: Family

## 2013-06-13 ENCOUNTER — Ambulatory Visit (INDEPENDENT_AMBULATORY_CARE_PROVIDER_SITE_OTHER): Payer: Medicare Other | Admitting: Family

## 2013-06-13 VITALS — BP 131/73 | HR 56 | Temp 97.0°F | Ht 68.0 in | Wt 186.0 lb

## 2013-06-13 DIAGNOSIS — R059 Cough, unspecified: Secondary | ICD-10-CM

## 2013-06-13 DIAGNOSIS — J302 Other seasonal allergic rhinitis: Secondary | ICD-10-CM

## 2013-06-13 DIAGNOSIS — J309 Allergic rhinitis, unspecified: Secondary | ICD-10-CM

## 2013-06-13 DIAGNOSIS — R05 Cough: Secondary | ICD-10-CM

## 2013-06-13 MED ORDER — FLUTICASONE PROPIONATE 50 MCG/ACT NA SUSP: 2.0000 | Freq: Every day | NASAL | Status: DC

## 2013-06-13 NOTE — Patient Instructions (Signed)

## 2013-06-13 NOTE — Progress Notes (Signed)
   Subjective:    Patient ID: Dillon Sweeney, male    DOB: [birth year], 80 y.o.   MRN: [id number]  Cough Associated symptoms include postnasal drip and rhinorrhea. Pertinent negatives include no ear pain or sore throat.   Pt here for follow-up for bronchitis. Pt states he is feeling great. Denies any pain or SOB. Still has a runny nose and a occasional productive cough with clear sputum.    Review of Systems  HENT: Positive for postnasal drip and rhinorrhea. Negative for congestion, ear pain, sneezing and sore throat.   Respiratory: Positive for cough.   All other systems reviewed and are negative.      Objective:   Physical Exam  Vitals reviewed. Constitutional: He is oriented to person, place, and time. He appears well-developed and well-nourished. No distress.  HENT:  Head: Normocephalic.  Right Ear: External ear normal.  Left Ear: External ear normal.  Mouth/Throat: Oropharynx is clear and moist.  Nasal passage erythemas   Eyes: Pupils are equal, round, and reactive to light. Right eye exhibits no discharge. Left eye exhibits no discharge.  Neck: Normal range of motion. Neck supple. No thyromegaly present.  Cardiovascular: Normal rate, regular rhythm, normal heart sounds and intact distal pulses.   No murmur heard. Pulmonary/Chest: Effort normal and breath sounds normal. No respiratory distress. He has no wheezes.  Dry cough   Musculoskeletal: Normal range of motion. He exhibits no edema and no tenderness.  Neurological: He is alert and oriented to person, place, and time.  Skin: Skin is warm and dry. No rash noted. No erythema.  Psychiatric: He has a normal mood and affect. His behavior is normal. Judgment and thought content normal.    BP 131/73  Pulse 56  Temp(Src) 97 F (36.1 C) (Oral)  Ht 5\' 8"  (1.727 m)  Wt 186 lb (84.369 kg)  BMI 28.29 kg/m2       Assessment & Plan:  1. Cough  2. Seasonal allergic rhinitis - fluticasone (FLONASE) 50 MCG/ACT nasal  spray; Place 2 sprays into both nostrils daily.  Dispense: 48 g; Refill: 11  -Start on a daily Claritin -Continue with Mucinex -RTO if s/s worsen  Evelina Dun, FNP

## 2013-08-27 ENCOUNTER — Other Ambulatory Visit: Payer: Self-pay | Admitting: *Deleted

## 2013-08-27 MED ORDER — METOPROLOL TARTRATE 100 MG PO TABS: ORAL_TABLET | ORAL | Status: DC

## 2013-08-27 MED ORDER — BENAZEPRIL HCL 40 MG PO TABS: 40.0000 mg | ORAL_TABLET | Freq: Every day | ORAL | Status: DC

## 2013-09-13 ENCOUNTER — Encounter: Payer: Self-pay | Admitting: Family

## 2013-09-13 ENCOUNTER — Ambulatory Visit (INDEPENDENT_AMBULATORY_CARE_PROVIDER_SITE_OTHER): Payer: Medicare Other | Admitting: Family

## 2013-09-13 VITALS — BP 121/70 | HR 56 | Temp 97.0°F | Ht 68.0 in | Wt 183.2 lb

## 2013-09-13 DIAGNOSIS — E785 Hyperlipidemia, unspecified: Secondary | ICD-10-CM

## 2013-09-13 DIAGNOSIS — E039 Hypothyroidism, unspecified: Secondary | ICD-10-CM

## 2013-09-13 DIAGNOSIS — E559 Vitamin D deficiency, unspecified: Secondary | ICD-10-CM

## 2013-09-13 DIAGNOSIS — E119 Type 2 diabetes mellitus without complications: Secondary | ICD-10-CM

## 2013-09-13 DIAGNOSIS — K219 Gastro-esophageal reflux disease without esophagitis: Secondary | ICD-10-CM

## 2013-09-13 DIAGNOSIS — I10 Essential (primary) hypertension: Secondary | ICD-10-CM

## 2013-09-13 MED ORDER — ROSUVASTATIN CALCIUM 40 MG PO TABS: ORAL_TABLET | ORAL | Status: DC

## 2013-09-13 MED ORDER — BENAZEPRIL HCL 40 MG PO TABS: 40.0000 mg | ORAL_TABLET | Freq: Every day | ORAL | Status: DC

## 2013-09-13 MED ORDER — AMLODIPINE BESYLATE 5 MG PO TABS: 5.0000 mg | ORAL_TABLET | Freq: Every day | ORAL | Status: DC

## 2013-09-13 MED ORDER — LEVOTHYROXINE SODIUM 137 MCG PO TABS: 137.0000 ug | ORAL_TABLET | Freq: Every day | ORAL | Status: DC

## 2013-09-13 MED ORDER — METFORMIN HCL 500 MG PO TABS: 500.0000 mg | ORAL_TABLET | Freq: Every day | ORAL | Status: DC

## 2013-09-13 MED ORDER — METOPROLOL TARTRATE 100 MG PO TABS: 100.0000 mg | ORAL_TABLET | Freq: Every day | ORAL | Status: DC

## 2013-09-13 NOTE — Patient Instructions (Signed)

## 2013-09-13 NOTE — Progress Notes (Signed)
 Subjective:    Patient ID: Dillon Sweeney, male    DOB: 01/29/1933, 81 y.o.   MRN: [id number]  Diabetes He presents for his follow-up diabetic visit. He has type 2 diabetes mellitus. His disease course has been stable. Pertinent negatives for hypoglycemia include no confusion, dizziness or headaches. Pertinent negatives for diabetes include no blurred vision, no chest pain, no fatigue, no foot paresthesias, no foot ulcerations and no visual change. Symptoms are stable. Diabetic complications include heart disease. Pertinent negatives for diabetic complications include no CVA, nephropathy, peripheral neuropathy or PVD. Risk factors for coronary artery disease include diabetes mellitus, dyslipidemia, family history, male sex, hypertension and tobacco exposure. Current diabetic treatment includes oral agent (monotherapy). He is compliant with treatment all of the time. He is following a generally healthy diet. His breakfast blood glucose range is generally 110-130 mg/dl. An ACE inhibitor/angiotensin II receptor blocker is being taken. Eye exam is current.  Hypertension This is a chronic problem. The current episode started more than 1 year ago. The problem has been resolved since onset. The problem is controlled. Pertinent negatives include no blurred vision, chest pain, headaches, palpitations, peripheral edema or shortness of breath. Risk factors for coronary artery disease include diabetes mellitus, dyslipidemia, family history, male gender and smoking/tobacco exposure. Past treatments include ACE inhibitors, beta blockers, calcium channel blockers and diuretics. The current treatment provides significant improvement. Hypertensive end-organ damage includes CAD/MI and a thyroid problem. There is no history of kidney disease, CVA, heart failure or PVD. There is no history of sleep apnea.  Hyperlipidemia This is a chronic problem. The current episode started more than 1 year ago. The problem is  controlled. Recent lipid tests were reviewed and are normal. Exacerbating diseases include diabetes and hypothyroidism. Factors aggravating his hyperlipidemia include smoking. Pertinent negatives include no chest pain, leg pain, myalgias or shortness of breath. Current antihyperlipidemic treatment includes statins. The current treatment provides significant improvement of lipids. Risk factors for coronary artery disease include diabetes mellitus, dyslipidemia, family history, hypertension, male sex and a sedentary lifestyle.  Gastrophageal Reflux He reports no chest pain, no coughing, no heartburn or no sore throat. This is a chronic problem. The current episode started more than 1 year ago. The problem occurs rarely. The problem has been resolved. The symptoms are aggravated by certain foods. Pertinent negatives include no fatigue. He has tried a PPI for the symptoms. The treatment provided significant relief.  Thyroid Problem Presents for follow-up visit. Symptoms include constipation. Patient reports no cold intolerance, dry skin, fatigue, leg swelling, palpitations or visual change. The symptoms have been improving. Past treatments include levothyroxine. The treatment provided significant relief. His past medical history is significant for diabetes and hyperlipidemia. There is no history of heart failure.      Review of Systems  Constitutional: Negative.  Negative for fatigue.  HENT: Negative.  Negative for sore throat.   Eyes: Negative for blurred vision.  Respiratory: Negative.  Negative for cough and shortness of breath.   Cardiovascular: Negative.  Negative for chest pain and palpitations.  Gastrointestinal: Positive for constipation. Negative for heartburn.  Endocrine: Negative.  Negative for cold intolerance.  Genitourinary: Negative.   Musculoskeletal: Negative.  Negative for myalgias.  Neurological: Negative.  Negative for dizziness and headaches.  Hematological: Negative.     Psychiatric/Behavioral: Negative.  Negative for confusion.  All other systems reviewed and are negative.      Objective:   Physical Exam  Vitals reviewed. Constitutional: He is oriented to person,   place, and time. He appears well-developed and well-nourished. No distress.  HENT:  Head: Normocephalic.  Right Ear: External ear normal.  Left Ear: External ear normal.  Nose: Nose normal.  Mouth/Throat: Oropharynx is clear and moist.  Eyes: Pupils are equal, round, and reactive to light. Right eye exhibits no discharge. Left eye exhibits no discharge.  Neck: Normal range of motion. Neck supple. No thyromegaly present.  Cardiovascular: Normal rate, regular rhythm, normal heart sounds and intact distal pulses.   No murmur heard. Pulmonary/Chest: Effort normal and breath sounds normal. No respiratory distress. He has no wheezes.  Abdominal: Soft. Bowel sounds are normal. He exhibits no distension. There is no tenderness.  Musculoskeletal: Normal range of motion. He exhibits no edema and no tenderness.  Neurological: He is alert and oriented to person, place, and time. He has normal reflexes. No cranial nerve deficit.  Skin: Skin is warm and dry. No rash noted. No erythema.  Psychiatric: He has a normal mood and affect. His behavior is normal. Judgment and thought content normal.    BP 121/70  Pulse 56  Temp(Src) 97 F (36.1 C) (Oral)  Ht 5' 8" (1.727 m)  Wt 183 lb 3.2 oz (83.099 kg)  BMI 27.86 kg/m2       Assessment & Plan:  1. Essential hypertension - CMP14+EGFR - benazepril (LOTENSIN) 40 MG tablet; Take 1 tablet (40 mg total) by mouth daily.  Dispense: 90 tablet; Refill: 4 - amLODipine (NORVASC) 5 MG tablet; Take 1 tablet (5 mg total) by mouth daily.  Dispense: 90 tablet; Refill: 3 - metoprolol (LOPRESSOR) 100 MG tablet; Take 1 tablet (100 mg total) by mouth daily.  Dispense: 90 tablet; Refill: 4  2. Type 2 diabetes mellitus without complication - CMP14+EGFR - metFORMIN  (GLUCOPHAGE) 500 MG tablet; Take 1 tablet (500 mg total) by mouth daily with breakfast.  Dispense: 90 tablet; Refill: 4  3. Unspecified hypothyroidism - CMP14+EGFR - Thyroid Panel With TSH - levothyroxine (SYNTHROID, LEVOTHROID) 137 MCG tablet; Take 1 tablet (137 mcg total) by mouth daily before breakfast.  Dispense: 90 tablet; Refill: 3  4. Hyperlipidemia - CMP14+EGFR - Lipid panel  5. Unspecified vitamin D deficiency - CMP14+EGFR - Vit D  25 hydroxy (rtn osteoporosis monitoring)  6. Gastroesophageal reflux disease without esophagitis - CMP14+EGFR  7. HLD (hyperlipidemia) - CMP14+EGFR - rosuvastatin (CRESTOR) 40 MG tablet; TAKE ONE TABLET BY MOUTH EVERY DAY  Dispense: 90 tablet; Refill: 3   Continue all meds Labs pending Health Maintenance reviewed Diet and exercise encouraged RTO 3 months   , FNP   

## 2013-10-11 ENCOUNTER — Encounter: Payer: Self-pay | Admitting: *Deleted

## 2013-10-14 ENCOUNTER — Other Ambulatory Visit: Payer: Self-pay | Admitting: *Deleted

## 2013-10-14 DIAGNOSIS — E119 Type 2 diabetes mellitus without complications: Secondary | ICD-10-CM

## 2013-10-14 DIAGNOSIS — I10 Essential (primary) hypertension: Secondary | ICD-10-CM

## 2013-10-14 MED ORDER — METOPROLOL TARTRATE 100 MG PO TABS
100.0000 mg | ORAL_TABLET | Freq: Every day | ORAL | Status: DC
Start: 2013-10-14 — End: 2014-06-10

## 2013-10-14 MED ORDER — METFORMIN HCL 500 MG PO TABS: 500.0000 mg | ORAL_TABLET | Freq: Every day | ORAL | Status: DC

## 2013-10-14 MED ORDER — BENAZEPRIL HCL 40 MG PO TABS: 40.0000 mg | ORAL_TABLET | Freq: Every day | ORAL | Status: DC

## 2013-10-14 MED ORDER — AMLODIPINE BESYLATE 5 MG PO TABS: 5.0000 mg | ORAL_TABLET | Freq: Every day | ORAL | Status: DC

## 2013-11-12 ENCOUNTER — Ambulatory Visit (INDEPENDENT_AMBULATORY_CARE_PROVIDER_SITE_OTHER): Payer: Medicare Other

## 2013-11-12 DIAGNOSIS — Z23 Encounter for immunization: Secondary | ICD-10-CM

## 2013-12-17 ENCOUNTER — Ambulatory Visit: Payer: Medicare Other | Admitting: Family Medicine

## 2013-12-19 ENCOUNTER — Ambulatory Visit (INDEPENDENT_AMBULATORY_CARE_PROVIDER_SITE_OTHER): Payer: Medicare Other | Admitting: Family Medicine

## 2013-12-19 ENCOUNTER — Other Ambulatory Visit: Payer: Self-pay | Admitting: Family Medicine

## 2013-12-19 ENCOUNTER — Encounter: Payer: Self-pay | Admitting: Family Medicine

## 2013-12-19 VITALS — BP 115/61 | HR 57 | Temp 97.3°F | Ht 68.0 in | Wt 185.2 lb

## 2013-12-19 DIAGNOSIS — E785 Hyperlipidemia, unspecified: Secondary | ICD-10-CM

## 2013-12-19 DIAGNOSIS — E119 Type 2 diabetes mellitus without complications: Secondary | ICD-10-CM

## 2013-12-19 DIAGNOSIS — I10 Essential (primary) hypertension: Secondary | ICD-10-CM

## 2013-12-19 LAB — POCT GLYCOSYLATED HEMOGLOBIN (HGB A1C): Hemoglobin A1C: 6.2

## 2013-12-19 NOTE — Progress Notes (Signed)
   Subjective:    Patient ID: Dillon Sweeney, male    DOB: Nov 28, 1932, 80 y.o.   MRN: [id number]  HPI 80 year old gentleman here to follow up diabetes hypertension hyperlipidemia. He has no complaints today and feels well. He is compliant as far as his medications and diet. He also had 5 bypass CABG some years ago does not see a cardiologist and has had no chest pain.    Review of Systems  Constitutional: Negative.   HENT: Negative.   Eyes: Negative.   Respiratory: Negative.  Negative for shortness of breath.   Cardiovascular: Negative.  Negative for chest pain and leg swelling.  Gastrointestinal: Negative.   Genitourinary: Negative.   Musculoskeletal: Negative.   Skin: Negative.   Neurological: Negative.   Psychiatric/Behavioral: Negative.   All other systems reviewed and are negative.      Objective:   Physical Exam  Constitutional: He is oriented to person, place, and time. He appears well-developed and well-nourished.  HENT:  Head: Normocephalic.  Right Ear: External ear normal.  Left Ear: External ear normal.  Nose: Nose normal.  Mouth/Throat: Oropharynx is clear and moist.  Eyes: Conjunctivae and EOM are normal. Pupils are equal, round, and reactive to light.  Neck: Normal range of motion. Neck supple.  Cardiovascular: Normal rate, regular rhythm, normal heart sounds and intact distal pulses.   Pulmonary/Chest: Effort normal and breath sounds normal.  Abdominal: Soft. Bowel sounds are normal.  Musculoskeletal: Normal range of motion.  Neurological: He is alert and oriented to person, place, and time.  Skin: Skin is warm and dry.  Psychiatric: He has a normal mood and affect. His behavior is normal. Judgment and thought content normal.   BP 115/61 mmHg  Pulse 57  Temp(Src) 97.3 F (36.3 C) (Oral)  Ht 5\' 8"  (1.727 m)  Wt 185 lb 3.2 oz (84.006 kg)  BMI 28.17 kg/m2        Assessment & Plan:  1. Essential hypertension   2. HLD (hyperlipidemia)   3. Type  2 diabetes mellitus without complication   Wardell Honour MD

## 2013-12-19 NOTE — Patient Instructions (Signed)
Diabetes and Foot Care Diabetes may cause you to have problems because of poor blood supply (circulation) to your feet and legs. This may cause the skin on your feet to become thinner, break easier, and heal more slowly. Your skin may become dry, and the skin may peel and crack. You may also have nerve damage in your legs and feet causing decreased feeling in them. You may not notice minor injuries to your feet that could lead to infections or more serious problems. Taking care of your feet is one of the most important things you can do for yourself.  HOME CARE INSTRUCTIONS  Wear shoes at all times, even in the house. Do not go barefoot. Bare feet are easily injured.  Check your feet daily for blisters, cuts, and redness. If you cannot see the bottom of your feet, use a mirror or ask someone for help.  Wash your feet with warm water (do not use hot water) and mild soap. Then pat your feet and the areas between your toes until they are completely dry. Do not soak your feet as this can dry your skin.  Apply a moisturizing lotion or petroleum jelly (that does not contain alcohol and is unscented) to the skin on your feet and to dry, brittle toenails. Do not apply lotion between your toes.  Trim your toenails straight across. Do not dig under them or around the cuticle. File the edges of your nails with an emery board or nail file.  Do not cut corns or calluses or try to remove them with medicine.  Wear clean socks or stockings every day. Make sure they are not too tight. Do not wear knee-high stockings since they may decrease blood flow to your legs.  Wear shoes that fit properly and have enough cushioning. To break in new shoes, wear them for just a few hours a day. This prevents you from injuring your feet. Always look in your shoes before you put them on to be sure there are no objects inside.  Do not cross your legs. This may decrease the blood flow to your feet.  If you find a minor scrape,  cut, or break in the skin on your feet, keep it and the skin around it clean and dry. These areas may be cleansed with mild soap and water. Do not cleanse the area with peroxide, alcohol, or iodine.  When you remove an adhesive bandage, be sure not to damage the skin around it.  If you have a wound, look at it several times a day to make sure it is healing.  Do not use heating pads or hot water bottles. They may burn your skin. If you have lost feeling in your feet or legs, you may not know it is happening until it is too late.  Make sure your health care provider performs a complete foot exam at least annually or more often if you have foot problems. Report any cuts, sores, or bruises to your health care provider immediately. SEEK MEDICAL CARE IF:   You have an injury that is not healing.  You have cuts or breaks in the skin.  You have an ingrown nail.  You notice redness on your legs or feet.  You feel burning or tingling in your legs or feet.  You have pain or cramps in your legs and feet.  Your legs or feet are numb.  Your feet always feel cold. SEEK IMMEDIATE MEDICAL CARE IF:   There is increasing redness,   swelling, or pain in or around a wound.  There is a red line that goes up your leg.  Pus is coming from a wound.  You develop a fever or as directed by your health care provider.  You notice a bad smell coming from an ulcer or wound. Document Released: 01/15/2000 Document Revised: 09/19/2012 Document Reviewed: 06/26/2012 [employer] Patient Information 2015 [employer]. This information is not intended to replace advice given to you by your health care provider. Make sure you discuss any questions you have with your health care provider.  

## 2014-04-17 ENCOUNTER — Encounter: Payer: Self-pay | Admitting: Family Medicine

## 2014-04-17 ENCOUNTER — Ambulatory Visit (INDEPENDENT_AMBULATORY_CARE_PROVIDER_SITE_OTHER): Payer: Medicare Other | Admitting: Family Medicine

## 2014-04-17 VITALS — BP 146/76 | HR 75 | Temp 96.4°F | Ht 68.0 in | Wt 192.0 lb

## 2014-04-17 DIAGNOSIS — Z23 Encounter for immunization: Secondary | ICD-10-CM

## 2014-04-17 DIAGNOSIS — I10 Essential (primary) hypertension: Secondary | ICD-10-CM | POA: Diagnosis not present

## 2014-04-17 DIAGNOSIS — E119 Type 2 diabetes mellitus without complications: Secondary | ICD-10-CM | POA: Diagnosis not present

## 2014-04-17 DIAGNOSIS — E785 Hyperlipidemia, unspecified: Secondary | ICD-10-CM | POA: Diagnosis not present

## 2014-04-17 DIAGNOSIS — E039 Hypothyroidism, unspecified: Secondary | ICD-10-CM

## 2014-04-17 LAB — POCT UA - MICROALBUMIN: Microalbumin Ur, POC: 20 mg/L

## 2014-04-17 LAB — POCT GLYCOSYLATED HEMOGLOBIN (HGB A1C): Hemoglobin A1C: 6.4

## 2014-04-17 NOTE — Patient Instructions (Signed)
sdPneumococcal Vaccine, Polyvalent suspension for injection What is this medicine? PNEUMOCOCCAL VACCINE, POLYVALENT (NEU mo KOK al vak SEEN, pol ee VEY luhnt) is a vaccine to prevent pneumococcus bacteria infection. These bacteria are a major cause of ear infections, 'Strep throat' infections, and serious pneumonia, meningitis, or blood infections worldwide. These vaccines help the body to produce antibodies (protective substances) that help your body defend against these bacteria. This vaccine is recommended for infants and young children. This vaccine will not treat an infection. This medicine may be used for other purposes; ask your health care provider or pharmacist if you have questions. COMMON BRAND NAME(S): Prevnar 13 What should I tell my health care provider before I take this medicine? They need to know if you have any of these conditions: -bleeding problems -fever -immune system problems -low platelet count in the blood -seizures -an unusual or allergic reaction to pneumococcal vaccine, diphtheria toxoid, other vaccines, latex, other medicines, foods, dyes, or preservatives -pregnant or trying to get pregnant -breast-feeding How should I use this medicine? This vaccine is for injection into a muscle. It is given by a health care professional. A copy of Vaccine Information Statements will be given before each vaccination. Read this sheet carefully each time. The sheet may change frequently. Talk to your pediatrician regarding the use of this medicine in children. While this drug may be prescribed for children as young as 50 weeks old for selected conditions, precautions do apply. Overdosage: If you think you have taken too much of this medicine contact a poison control center or emergency room at once. NOTE: This medicine is only for you. Do not share this medicine with others. What if I miss a dose? It is important not to miss your dose. Call your doctor or health care professional if  you are unable to keep an appointment. What may interact with this medicine? -medicines for cancer chemotherapy -medicines that suppress your immune function -medicines that treat or prevent blood clots like warfarin, enoxaparin, and dalteparin -steroid medicines like prednisone or cortisone This list may not describe all possible interactions. Give your health care provider a list of all the medicines, herbs, non-prescription drugs, or dietary supplements you use. Also tell them if you smoke, drink alcohol, or use illegal drugs. Some items may interact with your medicine. What should I watch for while using this medicine? Mild fever and pain should go away in 3 days or less. Report any unusual symptoms to your doctor or health care professional. What side effects may I notice from receiving this medicine? Side effects that you should report to your doctor or health care professional as soon as possible: -allergic reactions like skin rash, itching or hives, swelling of the face, lips, or tongue -breathing problems -confused -fever over 102 degrees F -pain, tingling, numbness in the hands or feet -seizures -unusual bleeding or bruising -unusual muscle weakness Side effects that usually do not require medical attention (report to your doctor or health care professional if they continue or are bothersome): -aches and pains -diarrhea -fever of 102 degrees F or less -headache -irritable -loss of appetite -pain, tender at site where injected -trouble sleeping This list may not describe all possible side effects. Call your doctor for medical advice about side effects. You may report side effects to FDA at 1-800-FDA-1088. Where should I keep my medicine? This does not apply. This vaccine is given in a clinic, pharmacy, doctor's office, or other health care setting and will not be stored at home. NOTE:  This sheet is a summary. It may not cover all possible information. If you have questions  about this medicine, talk to your doctor, pharmacist, or health care provider.  2015, Elsevier/Gold Standard. (2008-04-01 10:17:22)

## 2014-04-17 NOTE — Addendum Note (Signed)
Addended by: Earlene Plater on: 04/17/2014 11:08 AM   Modules accepted: Orders

## 2014-04-17 NOTE — Progress Notes (Signed)
Subjective:    Patient ID: Dillon Sweeney, male    DOB: [birth year], 81 y.o.   MRN: [id number]  HPI  81 year old gentleman here to follow-up hypertension, diabetes, and hyperlipidemia, generally he feels well. He denies any chest pain. He does have some dependent edema but that may be due to his calcium gentleman blocker. He denies neuropathic symptoms. I do note that he has gained about 8 pounds since his last visit of November 2015.  Patient Active Problem List   Diagnosis Date Noted  . GERD (gastroesophageal reflux disease) 09/13/2013  . Atherosclerosis   . Cholelithiasis   . Bladder stones   . Hyperlipidemia   . Diabetes 08/06/2012  . HTN (hypertension) 08/06/2012  . Unspecified vitamin D deficiency 08/06/2012  . Unspecified hypothyroidism 08/06/2012  . Tobacco use disorder 08/06/2012  . Abdominal aneurysm without mention of rupture 10/13/2011  . Myocardial infarction 09/22/2010   Outpatient Encounter Prescriptions as of 04/17/2014  Medication Sig  . Acetaminophen (TYLENOL PO) Take 1-2 tablets by mouth every 6 (six) hours as needed.    Marland Kitchen amLODipine (NORVASC) 5 MG tablet Take 1 tablet (5 mg total) by mouth daily.  Marland Kitchen aspirin 81 MG tablet Take 81 mg by mouth daily.    . benazepril (LOTENSIN) 40 MG tablet Take 1 tablet (40 mg total) by mouth daily.  . Cholecalciferol (VITAMIN D) 2000 UNITS CAPS Take by mouth.  . fluticasone (FLONASE) 50 MCG/ACT nasal spray Place 2 sprays into both nostrils daily.  Marland Kitchen levothyroxine (SYNTHROID, LEVOTHROID) 137 MCG tablet Take 1 tablet (137 mcg total) by mouth daily before breakfast.  . metFORMIN (GLUCOPHAGE) 500 MG tablet Take 1 tablet (500 mg total) by mouth daily with breakfast.  . metoprolol (LOPRESSOR) 100 MG tablet Take 1 tablet (100 mg total) by mouth daily.  . multivitamin (THERAGRAN) per tablet Take 1 tablet by mouth daily.    . rosuvastatin (CRESTOR) 40 MG tablet TAKE ONE TABLET BY MOUTH EVERY DAY  . [DISCONTINUED] calcium-vitamin D (OSCAL)  250-125 MG-UNIT per tablet Take 1 tablet by mouth daily.    . [DISCONTINUED] Ferrous Sulfate 27 MG TABS Take 1 tablet by mouth daily.      Review of Systems  Constitutional: Positive for unexpected weight change.  HENT: Negative.   Eyes: Negative.   Respiratory: Negative.   Cardiovascular: Negative.   Gastrointestinal: Negative.   Genitourinary: Negative.   Neurological: Negative.        Objective:   Physical Exam  Constitutional: He is oriented to person, place, and time. He appears well-developed and well-nourished.  HENT:  Head: Normocephalic.  Cardiovascular: Normal rate, regular rhythm, normal heart sounds and intact distal pulses.   Pulmonary/Chest: Effort normal. He has wheezes.  Abdominal: Soft.  Musculoskeletal: Normal range of motion.  Neurological: He is alert and oriented to person, place, and time.    BP 146/76 mmHg  Pulse 75  Temp(Src) 96.4 F (35.8 C) (Oral)  Ht $R'5\' 8"'oy$  (1.727 m)  Wt 192 lb (87.091 kg)  BMI 29.20 kg/m2        Assessment & Plan:  1. Hypothyroidism, unspecified hypothyroidism type  - TSH  2. Hyperlipidemia Tolerating statin. Continue same. Had it has been 1 year since we last checked cholesterol. - CMP14+EGFR - Lipid panel  3. Essential hypertension Blood pressure is controlled on multiple agents  4. Type 2 diabetes mellitus without complication Doing well on single agent, metformin. - POCT glycosylated hemoglobin (Hb A1C) - POCT UA - Microalbumin  Lillette Boxer  Sabra Heck MD

## 2014-04-18 ENCOUNTER — Other Ambulatory Visit: Payer: Self-pay | Admitting: *Deleted

## 2014-04-18 ENCOUNTER — Other Ambulatory Visit (INDEPENDENT_AMBULATORY_CARE_PROVIDER_SITE_OTHER): Payer: Medicare Other

## 2014-04-18 DIAGNOSIS — R799 Abnormal finding of blood chemistry, unspecified: Secondary | ICD-10-CM | POA: Diagnosis not present

## 2014-04-18 LAB — CMP14+EGFR
ALK PHOS: 55 IU/L (ref 39–117)
ALT: 23 IU/L (ref 0–44)
AST: 26 IU/L (ref 0–40)
Albumin/Globulin Ratio: 1.8 (ref 1.1–2.5)
Albumin: 4.4 g/dL (ref 3.5–4.8)
BILIRUBIN TOTAL: 0.4 mg/dL (ref 0.0–1.2)
BUN / CREAT RATIO: 12 (ref 10–22)
BUN: 15 mg/dL (ref 8–27)
CHLORIDE: 101 mmol/L (ref 97–108)
CO2: 25 mmol/L (ref 18–29)
Calcium: 10.1 mg/dL (ref 8.6–10.2)
Creatinine, Ser: 1.22 mg/dL (ref 0.76–1.27)
GFR calc non Af Amer: 56 mL/min/{1.73_m2} — ABNORMAL LOW (ref >59)
GFR, EST AFRICAN AMERICAN: 65 mL/min/{1.73_m2} (ref >59)
Globulin, Total: 2.4 g/dL (ref 1.5–4.5)
Glucose: 95 mg/dL (ref 65–99)
Potassium: 5.9 mmol/L (ref 3.5–5.2)
Sodium: 141 mmol/L (ref 134–144)
Total Protein: 6.8 g/dL (ref 6.0–8.5)

## 2014-04-18 LAB — LIPID PANEL
Chol/HDL Ratio: 2.5 ratio units (ref 0.0–5.0)
Cholesterol, Total: 117 mg/dL (ref 100–199)
HDL: 46 mg/dL (ref >39)
LDL CALC: 59 mg/dL (ref 0–99)
Triglycerides: 60 mg/dL (ref 0–149)
VLDL Cholesterol Cal: 12 mg/dL (ref 5–40)

## 2014-04-18 LAB — TSH: TSH: 6.91 u[IU]/mL — ABNORMAL HIGH (ref 0.450–4.500)

## 2014-04-18 LAB — MICROALBUMIN, URINE: Microalbumin, Urine: 7.2 ug/mL (ref 0.0–17.0)

## 2014-04-18 MED ORDER — LEVOTHYROXINE SODIUM 150 MCG PO TABS: 150.0000 ug | ORAL_TABLET | Freq: Every day | ORAL | Status: DC

## 2014-04-19 LAB — POTASSIUM: POTASSIUM: 4.8 mmol/L (ref 3.5–5.2)

## 2014-06-10 ENCOUNTER — Other Ambulatory Visit: Payer: Self-pay | Admitting: Family Medicine

## 2014-06-10 DIAGNOSIS — E119 Type 2 diabetes mellitus without complications: Secondary | ICD-10-CM

## 2014-06-10 DIAGNOSIS — I10 Essential (primary) hypertension: Secondary | ICD-10-CM

## 2014-06-10 DIAGNOSIS — E785 Hyperlipidemia, unspecified: Secondary | ICD-10-CM

## 2014-06-10 MED ORDER — ROSUVASTATIN CALCIUM 40 MG PO TABS: ORAL_TABLET | ORAL | Status: DC

## 2014-06-10 MED ORDER — METFORMIN HCL 500 MG PO TABS: 500.0000 mg | ORAL_TABLET | Freq: Every day | ORAL | Status: DC

## 2014-06-10 MED ORDER — METOPROLOL TARTRATE 100 MG PO TABS: 100.0000 mg | ORAL_TABLET | Freq: Every day | ORAL | Status: DC

## 2014-06-10 MED ORDER — BENAZEPRIL HCL 40 MG PO TABS: 40.0000 mg | ORAL_TABLET | Freq: Every day | ORAL | Status: DC

## 2014-06-10 NOTE — Telephone Encounter (Signed)
Done

## 2014-08-01 ENCOUNTER — Telehealth: Payer: Self-pay | Admitting: Family Medicine

## 2014-08-01 ENCOUNTER — Other Ambulatory Visit: Payer: Self-pay

## 2014-08-01 DIAGNOSIS — I10 Essential (primary) hypertension: Secondary | ICD-10-CM

## 2014-08-01 MED ORDER — AMLODIPINE BESYLATE 5 MG PO TABS: 5.0000 mg | ORAL_TABLET | Freq: Every day | ORAL | Status: DC

## 2014-10-01 ENCOUNTER — Other Ambulatory Visit: Payer: Self-pay | Admitting: Family Medicine

## 2014-10-01 DIAGNOSIS — I10 Essential (primary) hypertension: Secondary | ICD-10-CM

## 2014-10-01 MED ORDER — AMLODIPINE BESYLATE 5 MG PO TABS: 5.0000 mg | ORAL_TABLET | Freq: Every day | ORAL | Status: DC

## 2014-10-01 NOTE — Telephone Encounter (Signed)
done

## 2014-10-21 ENCOUNTER — Ambulatory Visit (INDEPENDENT_AMBULATORY_CARE_PROVIDER_SITE_OTHER): Payer: Medicare Other | Admitting: Family Medicine

## 2014-10-21 ENCOUNTER — Encounter: Payer: Self-pay | Admitting: Family Medicine

## 2014-10-21 VITALS — BP 138/65 | HR 54 | Temp 97.9°F | Ht 68.0 in | Wt 184.0 lb

## 2014-10-21 DIAGNOSIS — I714 Abdominal aortic aneurysm, without rupture, unspecified: Secondary | ICD-10-CM

## 2014-10-21 DIAGNOSIS — E039 Hypothyroidism, unspecified: Secondary | ICD-10-CM | POA: Diagnosis not present

## 2014-10-21 DIAGNOSIS — E119 Type 2 diabetes mellitus without complications: Secondary | ICD-10-CM

## 2014-10-21 DIAGNOSIS — I10 Essential (primary) hypertension: Secondary | ICD-10-CM

## 2014-10-21 DIAGNOSIS — E785 Hyperlipidemia, unspecified: Secondary | ICD-10-CM

## 2014-10-21 LAB — POCT GLYCOSYLATED HEMOGLOBIN (HGB A1C): Hemoglobin A1C: 6.2

## 2014-10-21 MED ORDER — AMLODIPINE BESYLATE 5 MG PO TABS: 5.0000 mg | ORAL_TABLET | Freq: Every day | ORAL | Status: DC

## 2014-10-21 MED ORDER — ROSUVASTATIN CALCIUM 40 MG PO TABS: ORAL_TABLET | ORAL | Status: DC

## 2014-10-21 MED ORDER — METFORMIN HCL 500 MG PO TABS: 500.0000 mg | ORAL_TABLET | Freq: Every day | ORAL | Status: DC

## 2014-10-21 MED ORDER — METOPROLOL TARTRATE 100 MG PO TABS: 100.0000 mg | ORAL_TABLET | Freq: Every day | ORAL | Status: DC

## 2014-10-21 MED ORDER — LEVOTHYROXINE SODIUM 150 MCG PO TABS: 150.0000 ug | ORAL_TABLET | Freq: Every day | ORAL | Status: DC

## 2014-10-21 MED ORDER — BENAZEPRIL HCL 40 MG PO TABS: 40.0000 mg | ORAL_TABLET | Freq: Every day | ORAL | Status: DC

## 2014-10-21 NOTE — Progress Notes (Signed)
Subjective:    Patient ID: Dillon Sweeney, male    DOB: March 09, 1932, 81 y.o.   MRN: [id number]  HPI  81 year old gentleman who is here to follow-up diabetes, hyperlipidemia, hypertension,. He rarely checks sugars at home but when he does have all been less than 120. A1c's have been at goal with last one being 6.3 back in March. He denies symptoms of neuropathy. He does have a history of coronary disease having had a heart attack back in the 1980s and subsequent bypass surgery. He also had aortic aneurysm repair about 3 years ago. He does not see cardiology or vascular surgeon any longer.  Patient Active Problem List   Diagnosis Date Noted  . GERD (gastroesophageal reflux disease) 09/13/2013  . Atherosclerosis   . Cholelithiasis   . Bladder stones   . Hyperlipidemia   . Diabetes 08/06/2012  . HTN (hypertension) 08/06/2012  . Unspecified vitamin D deficiency 08/06/2012  . Hypothyroidism 08/06/2012  . Tobacco use disorder 08/06/2012  . Abdominal aneurysm without mention of rupture 10/13/2011  . Myocardial infarction 09/22/2010   Outpatient Encounter Prescriptions as of 10/21/2014  Medication Sig  . Acetaminophen (TYLENOL PO) Take 1-2 tablets by mouth every 6 (six) hours as needed.    Marland Kitchen amLODipine (NORVASC) 5 MG tablet Take 1 tablet (5 mg total) by mouth daily.  Marland Kitchen aspirin 81 MG tablet Take 81 mg by mouth daily.    . benazepril (LOTENSIN) 40 MG tablet Take 1 tablet (40 mg total) by mouth daily.  . Cholecalciferol (VITAMIN D) 2000 UNITS CAPS Take by mouth.  . fluticasone (FLONASE) 50 MCG/ACT nasal spray Place 2 sprays into both nostrils daily.  Marland Kitchen levothyroxine (SYNTHROID, LEVOTHROID) 150 MCG tablet Take 1 tablet (150 mcg total) by mouth daily.  . metFORMIN (GLUCOPHAGE) 500 MG tablet Take 1 tablet (500 mg total) by mouth daily with breakfast.  . metoprolol (LOPRESSOR) 100 MG tablet Take 1 tablet (100 mg total) by mouth daily.  . multivitamin (THERAGRAN) per tablet Take 1 tablet by mouth  daily.    . rosuvastatin (CRESTOR) 40 MG tablet TAKE ONE TABLET BY MOUTH EVERY DAY   No facility-administered encounter medications on file as of 10/21/2014.      Review of Systems  Constitutional: Negative.   Respiratory: Negative.   Cardiovascular: Negative.   Neurological: Negative.   Psychiatric/Behavioral: Negative.        Objective:   Physical Exam  Constitutional: He is oriented to person, place, and time. He appears well-developed and well-nourished.  Cardiovascular: Normal rate and regular rhythm.   Pulmonary/Chest: Effort normal and breath sounds normal.  Neurological: He is alert and oriented to person, place, and time.  Psychiatric: He has a normal mood and affect. His behavior is normal.    BP 138/65 mmHg  Pulse 54  Temp(Src) 97.9 F (36.6 C) (Oral)  Ht '5\' 8"'$  (1.727 m)  Wt 184 lb (83.462 kg)  BMI 27.98 kg/m2       Assessment & Plan:  1. Type 2 diabetes mellitus without complication Patient is trying to follow a low-carb diet. Tolerating metformin. A1c's have all been at goal and he has lost 8 pounds since last visit - metFORMIN (GLUCOPHAGE) 500 MG tablet; Take 1 tablet (500 mg total) by mouth daily with breakfast.  Dispense: 90 tablet; Refill: 1 - POCT glycosylated hemoglobin (Hb A1C)  2. Essential hypertension [place] is controlled continue on same medicine - amLODipine (NORVASC) 5 MG tablet; Take 1 tablet (5 mg total) by mouth  daily.  Dispense: 90 tablet; Refill: 1 - benazepril (LOTENSIN) 40 MG tablet; Take 1 tablet (40 mg total) by mouth daily.  Dispense: 90 tablet; Refill: 1 - metoprolol (LOPRESSOR) 100 MG tablet; Take 1 tablet (100 mg total) by mouth daily.  Dispense: 90 tablet; Refill: 1  3. Abdominal aortic aneurysm Status post repair 3 years ago. Asymptomatic.  4. HLD (hyperlipidemia) Pins were checked in March and were all at goal at that time. He tolerates statin well. Continue same dose - rosuvastatin (CRESTOR) 40 MG tablet; TAKE ONE TABLET BY  MOUTH EVERY DAY  Dispense: 90 tablet; Refill: 1  5. Hypothyroidism, unspecified hypothyroidism type Synthroid had been increased at his last visit but we have yet to check TSH so we will do that today - TSH  6. Hyperlipidemia  Wardell Honour MD

## 2014-10-22 LAB — TSH: TSH: 1.77 u[IU]/mL (ref 0.450–4.500)

## 2014-10-30 ENCOUNTER — Encounter: Payer: Self-pay | Admitting: Family Medicine

## 2014-11-26 ENCOUNTER — Ambulatory Visit (INDEPENDENT_AMBULATORY_CARE_PROVIDER_SITE_OTHER): Payer: Medicare Other

## 2014-11-26 DIAGNOSIS — Z23 Encounter for immunization: Secondary | ICD-10-CM

## 2015-02-12 ENCOUNTER — Other Ambulatory Visit: Payer: Self-pay | Admitting: Family Medicine

## 2015-02-12 DIAGNOSIS — E785 Hyperlipidemia, unspecified: Secondary | ICD-10-CM

## 2015-02-12 DIAGNOSIS — Z0289 Encounter for other administrative examinations: Secondary | ICD-10-CM

## 2015-02-12 DIAGNOSIS — I10 Essential (primary) hypertension: Secondary | ICD-10-CM

## 2015-02-13 MED ORDER — METOPROLOL TARTRATE 100 MG PO TABS: 100.0000 mg | ORAL_TABLET | Freq: Every day | ORAL | Status: DC

## 2015-02-13 MED ORDER — AMLODIPINE BESYLATE 5 MG PO TABS
5.0000 mg | ORAL_TABLET | Freq: Every day | ORAL | Status: DC
Start: 2015-02-13 — End: 2015-06-02

## 2015-02-13 MED ORDER — METFORMIN HCL 500 MG PO TABS: 500.0000 mg | ORAL_TABLET | Freq: Every day | ORAL | Status: DC

## 2015-02-13 MED ORDER — BENAZEPRIL HCL 40 MG PO TABS: 40.0000 mg | ORAL_TABLET | Freq: Every day | ORAL | Status: DC

## 2015-02-13 MED ORDER — ROSUVASTATIN CALCIUM 40 MG PO TABS: ORAL_TABLET | ORAL | Status: DC

## 2015-02-13 NOTE — Telephone Encounter (Signed)
done

## 2015-04-21 ENCOUNTER — Encounter: Payer: Self-pay | Admitting: Family Medicine

## 2015-04-21 ENCOUNTER — Ambulatory Visit (INDEPENDENT_AMBULATORY_CARE_PROVIDER_SITE_OTHER): Payer: PPO | Admitting: Family Medicine

## 2015-04-21 VITALS — Temp 96.8°F | Ht 68.0 in | Wt 193.4 lb

## 2015-04-21 DIAGNOSIS — E785 Hyperlipidemia, unspecified: Secondary | ICD-10-CM | POA: Diagnosis not present

## 2015-04-21 DIAGNOSIS — I714 Abdominal aortic aneurysm, without rupture, unspecified: Secondary | ICD-10-CM

## 2015-04-21 DIAGNOSIS — E119 Type 2 diabetes mellitus without complications: Secondary | ICD-10-CM | POA: Diagnosis not present

## 2015-04-21 DIAGNOSIS — I10 Essential (primary) hypertension: Secondary | ICD-10-CM

## 2015-04-21 LAB — CMP14+EGFR
A/G RATIO: 1.6 (ref 1.2–2.2)
ALK PHOS: 48 IU/L (ref 39–117)
ALT: 23 IU/L (ref 0–44)
AST: 22 IU/L (ref 0–40)
Albumin: 4.2 g/dL (ref 3.5–4.7)
BILIRUBIN TOTAL: 0.3 mg/dL (ref 0.0–1.2)
BUN/Creatinine Ratio: 13 (ref 10–22)
BUN: 12 mg/dL (ref 8–27)
CO2: 22 mmol/L (ref 18–29)
Calcium: 9.2 mg/dL (ref 8.6–10.2)
Chloride: 101 mmol/L (ref 96–106)
Creatinine, Ser: 0.91 mg/dL (ref 0.76–1.27)
GFR calc Af Amer: 91 mL/min/{1.73_m2} (ref >59)
GFR calc non Af Amer: 79 mL/min/{1.73_m2} (ref >59)
GLOBULIN, TOTAL: 2.7 g/dL (ref 1.5–4.5)
Glucose: 113 mg/dL — ABNORMAL HIGH (ref 65–99)
POTASSIUM: 4 mmol/L (ref 3.5–5.2)
SODIUM: 139 mmol/L (ref 134–144)
Total Protein: 6.9 g/dL (ref 6.0–8.5)

## 2015-04-21 LAB — BAYER DCA HB A1C WAIVED: HB A1C (BAYER DCA - WAIVED): 6.3 % (ref <7.0)

## 2015-04-21 LAB — LIPID PANEL
CHOLESTEROL TOTAL: 112 mg/dL (ref 100–199)
Chol/HDL Ratio: 2.5 ratio units (ref 0.0–5.0)
HDL: 44 mg/dL (ref >39)
LDL CALC: 50 mg/dL (ref 0–99)
TRIGLYCERIDES: 89 mg/dL (ref 0–149)
VLDL CHOLESTEROL CAL: 18 mg/dL (ref 5–40)

## 2015-04-21 MED ORDER — METFORMIN HCL 500 MG PO TABS: 500.0000 mg | ORAL_TABLET | Freq: Every day | ORAL | Status: DC

## 2015-04-21 MED ORDER — BENAZEPRIL HCL 40 MG PO TABS: 40.0000 mg | ORAL_TABLET | Freq: Every day | ORAL | Status: DC

## 2015-04-21 MED ORDER — ROSUVASTATIN CALCIUM 40 MG PO TABS: ORAL_TABLET | ORAL | Status: DC

## 2015-04-21 NOTE — Progress Notes (Signed)
Patient ID: Dillon Sweeney, male   DOB: 16-Nov-1932, 82 y.o.   MRN: [id number]  Primary Physician: Wardell Honour, MD  Chief Complaint: Here to follow-up chronic problems diabetes, hyperlipidemia, hypothyroid, he has no complaints and is doing well. He has gained some weight through the winter and we talked about the importance of keeping weight down. He gives blood regularly at the [facility]. Blood pressure and sugar are monitored. Last A1c in September was 6.2. He has had bypass surgery as well as aortic aneurysm repair and is no longer followed by cardiology.     Past Medical History  Diagnosis Date  . Chronic constipation   . Hypertension   . Dyslipidemia   . Hypothyroidism   . Diabetes mellitus     Type 2  . CAD (coronary artery disease)   . AAA (abdominal aortic aneurysm) ([place])   . Pneumonia 2012  . Atherosclerosis   . Myocardial infarction ([place]) 09/22/10    1986  . Cholelithiasis   . Bladder stones   . Hyperlipidemia      Home Meds: Prior to Admission medications   Medication Sig Start Date End Date Taking? Authorizing Provider  Acetaminophen (TYLENOL PO) Take 1-2 tablets by mouth every 6 (six) hours as needed.     Yes Historical Provider, MD  amLODipine (NORVASC) 5 MG tablet Take 1 tablet (5 mg total) by mouth daily. 02/13/15  Yes Wardell Honour, MD  aspirin 81 MG tablet Take 81 mg by mouth daily.     Yes Historical Provider, MD  benazepril (LOTENSIN) 40 MG tablet Take 1 tablet (40 mg total) by mouth daily. 02/13/15  Yes Wardell Honour, MD  Cholecalciferol (VITAMIN D) 2000 UNITS CAPS Take by mouth.   Yes Historical Provider, MD  levothyroxine (SYNTHROID, LEVOTHROID) 150 MCG tablet Take 1 tablet (150 mcg total) by mouth daily. 10/21/14  Yes Wardell Honour, MD  metFORMIN (GLUCOPHAGE) 500 MG tablet Take 1 tablet (500 mg total) by mouth daily with breakfast. 02/13/15  Yes Wardell Honour, MD  metoprolol (LOPRESSOR) 100 MG tablet Take 1 tablet (100 mg total) by mouth  daily. 02/13/15  Yes Wardell Honour, MD  multivitamin [facility]) per tablet Take 1 tablet by mouth daily.     Yes Historical Provider, MD  rosuvastatin (CRESTOR) 40 MG tablet TAKE ONE TABLET BY MOUTH EVERY DAY 02/13/15  Yes Wardell Honour, MD  fluticasone [facility]) 50 MCG/ACT nasal spray Place 2 sprays into both nostrils daily. Patient not taking: Reported on 04/21/2015 06/13/13   Sharion Balloon, FNP    Allergies:  Allergies  Allergen Reactions  . Morphine And Related     hallucinations    Social History   Social History  . Marital Status: Married    Spouse Name: N/A  . Number of Children: N/A  . Years of Education: N/A   Occupational History  . Not on file.   Social History Main Topics  . Smoking status: Former Smoker -- 1.00 packs/day    Types: Cigarettes    Quit date: 09/10/2010  . Smokeless tobacco: Never Used  . Alcohol Use: No  . Drug Use: No  . Sexual Activity: Not on file   Other Topics Concern  . Not on file   Social History Narrative     Review of Systems: Constitutional: negative for chills, fever, night sweats, weight changes, or fatigue  HEENT: negative for vision changes, hearing loss, congestion, rhinorrhea, ST, epistaxis, or sinus pressure Cardiovascular: negative  for chest pain or palpitations Respiratory: negative for hemoptysis, wheezing, shortness of breath, or cough Abdominal: negative for abdominal pain, nausea, vomiting, diarrhea, or constipation Dermatological: negative for rash Neurologic: negative for headache, dizziness, or syncope All other systems reviewed and are otherwise negative with the exception to those above and in the HPI.   Physical Exam: Temperature 96.8 F (36 C), temperature source Oral, height _0  (1.727 m), weight 193 lb 6.4 oz (87.726 kg)., Body mass index is 29.41 kg/(m^2). General: Well developed, well nourished, in no acute distress. Head: Normocephalic, atraumatic, eyes without discharge, sclera non-icteric,  nares are without discharge. Bilateral auditory canals clear, TM's are without perforation, pearly grey and translucent with reflective cone of light bilaterally. Oral cavity moist, posterior pharynx without exudate, erythema, peritonsillar abscess, or post nasal drip.  Neck: Supple. No thyromegaly. Full ROM. No lymphadenopathy. Lungs: Clear bilaterally to auscultation without wheezes, rales, or rhonchi. Breathing is unlabored. Heart: RRR with S1 S2. No murmurs, rubs, or gallops appreciated. Abdomen: Soft, non-tender, non-distended with normoactive bowel sounds. No hepatomegaly. No rebound/guarding. No obvious abdominal masses. Msk:  Strength and tone normal for age. Extremities/Skin: Warm and dry. No clubbing or cyanosis. No edema. No rashes or suspicious lesions. Neuro: Alert and oriented X 3. Moves all extremities spontaneously. Gait is normal. CNII-XII grossly in tact. Psych:  Responds to questions appropriately with a normal affect.   Labs:   ASSESSMENT AND PLAN:  1. Type 2 diabetes mellitus without complication, without long-term current use of insulin (HCC) A1c today is 6.3, so there is no change in control his very good. - Bayer DCA Hb A1c Waived  2. Essential hypertension [place] varies from 1:30 to 145. I think this is okay for age - CMP14+EGFR - benazepril (LOTENSIN) 40 MG tablet; Take 1 tablet (40 mg total) by mouth daily.  Dispense: 90 tablet; Refill: 1  3. HLD (hyperlipidemia) Last lipids were at goal with LDL of 59. - Lipid panel - rosuvastatin (CRESTOR) 40 MG tablet; TAKE ONE TABLET BY MOUTH EVERY DAY  Dispense: 90 tablet; Refill: 1  4. Abdominal aortic aneurysm (AAA) without rupture (HCC) Asymptomatic and peripheral pulses are excellent  Wardell Honour MD  04/21/2015 8:15 AM

## 2015-04-21 NOTE — Patient Instructions (Signed)
Thank you for allowing us to care for you today. We strive to provide exceptional quality and compassionate care. Please let us know how we are doing and how we can help serve you better by filling out the survey that you receive from [employer].     

## 2015-06-01 ENCOUNTER — Other Ambulatory Visit: Payer: Self-pay | Admitting: Family Medicine

## 2015-06-01 DIAGNOSIS — E785 Hyperlipidemia, unspecified: Secondary | ICD-10-CM

## 2015-06-01 DIAGNOSIS — I10 Essential (primary) hypertension: Secondary | ICD-10-CM

## 2015-06-02 MED ORDER — METFORMIN HCL 500 MG PO TABS: 500.0000 mg | ORAL_TABLET | Freq: Every day | ORAL | Status: DC

## 2015-06-02 MED ORDER — ROSUVASTATIN CALCIUM 40 MG PO TABS: ORAL_TABLET | ORAL | Status: DC

## 2015-06-02 MED ORDER — METOPROLOL TARTRATE 100 MG PO TABS: 100.0000 mg | ORAL_TABLET | Freq: Every day | ORAL | Status: DC

## 2015-06-02 MED ORDER — BENAZEPRIL HCL 40 MG PO TABS: 40.0000 mg | ORAL_TABLET | Freq: Every day | ORAL | Status: DC

## 2015-06-02 MED ORDER — LEVOTHYROXINE SODIUM 150 MCG PO TABS: 150.0000 ug | ORAL_TABLET | Freq: Every day | ORAL | Status: DC

## 2015-06-02 MED ORDER — AMLODIPINE BESYLATE 5 MG PO TABS: 5.0000 mg | ORAL_TABLET | Freq: Every day | ORAL | Status: DC

## 2015-06-02 NOTE — Telephone Encounter (Signed)
done

## 2015-06-03 ENCOUNTER — Encounter: Payer: Self-pay | Admitting: *Deleted

## 2015-06-06 LAB — HM DIABETES EYE EXAM

## 2015-08-21 ENCOUNTER — Ambulatory Visit: Payer: PPO | Admitting: Family Medicine

## 2015-08-26 ENCOUNTER — Ambulatory Visit (INDEPENDENT_AMBULATORY_CARE_PROVIDER_SITE_OTHER): Payer: PPO | Admitting: Family Medicine

## 2015-08-26 ENCOUNTER — Encounter: Payer: Self-pay | Admitting: Family Medicine

## 2015-08-26 VITALS — BP 149/72 | HR 54 | Temp 96.9°F | Ht 68.0 in | Wt 185.6 lb

## 2015-08-26 DIAGNOSIS — I10 Essential (primary) hypertension: Secondary | ICD-10-CM

## 2015-08-26 DIAGNOSIS — E119 Type 2 diabetes mellitus without complications: Secondary | ICD-10-CM

## 2015-08-26 DIAGNOSIS — E785 Hyperlipidemia, unspecified: Secondary | ICD-10-CM | POA: Diagnosis not present

## 2015-08-26 LAB — BAYER DCA HB A1C WAIVED: HB A1C: 6.2 % (ref <7.0)

## 2015-08-26 NOTE — Progress Notes (Signed)
Subjective:    Patient ID: Dillon Sweeney, male    DOB: 1933-01-12, 82 y.o.   MRN: [id number]  HPI 82 year old gentleman here to follow-up diabetes hypertension and lipids. His only complaint today is some hip pain that he notices more at night especially when he lays on the affected side. There is no pain noted in the daytime. Last A1c 4 months ago was 6.3. He regularly gives blood and blood pressures are good. It is slightly elevated today but given his age I am not concerned. Weight is stable.  Patient Active Problem List   Diagnosis Date Noted  . GERD (gastroesophageal reflux disease) 09/13/2013  . Atherosclerosis   . Cholelithiasis   . Bladder stones   . Hyperlipidemia   . Diabetes ([place]) 08/06/2012  . HTN (hypertension) 08/06/2012  . Unspecified vitamin D deficiency 08/06/2012  . Hypothyroidism 08/06/2012  . Tobacco use disorder 08/06/2012  . Abdominal aortic aneurysm 10/13/2011  . Myocardial infarction 09/22/2010   Outpatient Encounter Prescriptions as of 08/26/2015  Medication Sig  . Acetaminophen (TYLENOL PO) Take 1-2 tablets by mouth every 6 (six) hours as needed.    Marland Kitchen amLODipine (NORVASC) 5 MG tablet Take 1 tablet (5 mg total) by mouth daily.  Marland Kitchen aspirin 81 MG tablet Take 81 mg by mouth daily.    . benazepril (LOTENSIN) 40 MG tablet Take 1 tablet (40 mg total) by mouth daily.  . Cholecalciferol (VITAMIN D) 2000 UNITS CAPS Take by mouth.  . fluticasone (FLONASE) 50 MCG/ACT nasal spray Place 2 sprays into both nostrils daily.  Marland Kitchen levothyroxine (SYNTHROID, LEVOTHROID) 150 MCG tablet Take 1 tablet (150 mcg total) by mouth daily.  . metFORMIN (GLUCOPHAGE) 500 MG tablet Take 1 tablet (500 mg total) by mouth daily with breakfast.  . metoprolol (LOPRESSOR) 100 MG tablet Take 1 tablet (100 mg total) by mouth daily.  . multivitamin (THERAGRAN) per tablet Take 1 tablet by mouth daily.    . rosuvastatin (CRESTOR) 40 MG tablet TAKE ONE TABLET BY MOUTH EVERY DAY   No  facility-administered encounter medications on file as of 08/26/2015.       Review of Systems  Constitutional: Negative.   HENT: Negative.   Eyes: Negative.   Respiratory: Negative.  Negative for shortness of breath.   Cardiovascular: Negative.  Negative for chest pain and leg swelling.  Gastrointestinal: Negative.   Genitourinary: Negative.   Musculoskeletal: Negative.   Skin: Negative.   Neurological: Negative.   Psychiatric/Behavioral: Negative.   All other systems reviewed and are negative.      Objective:   Physical Exam  Constitutional: He appears well-developed and well-nourished.  Cardiovascular: Normal rate, regular rhythm and normal heart sounds.   Pulmonary/Chest: Effort normal and breath sounds normal.  Abdominal: Soft.  Musculoskeletal: Normal range of motion.  Right hip. There is no tenderness to palpation. I was thinking he may have some trochanteric bursitis but exam does not confirm  Neurological: He is alert.   BP (!) 149/72 (BP Location: Right Arm, Patient Position: Sitting, Cuff Size: Normal)   Pulse (!) 54   Temp (!) 96.9 F (36.1 C) (Oral)   Ht '5\' 8"'$  (1.727 m)   Wt 185 lb 9.6 oz (84.2 kg)   BMI 28.22 kg/m        Assessment & Plan:  1. Type 2 diabetes mellitus without complication, without long-term current use of insulin (HCC) Diabetes has been well controlled only on metformin. Expect no change today - Bayer DCA Hb A1c Waived -  Microalbumin / creatinine urine ratio  2. Essential hypertension I think blood pressures are generally well controlled also. No changes recommended  3. HLD (hyperlipidemia) Lipids are at goal with LDL of 50 and total cholesterol of 112. He does take rosuvastatin  Wardell Honour MD

## 2015-08-27 LAB — MICROALBUMIN / CREATININE URINE RATIO
Creatinine, Urine: 68.5 mg/dL
MICROALB/CREAT RATIO: 12.8 mg/g{creat} (ref 0.0–30.0)
MICROALBUM., U, RANDOM: 8.8 ug/mL

## 2015-09-30 ENCOUNTER — Encounter: Payer: Self-pay | Admitting: Physician Assistant

## 2015-09-30 ENCOUNTER — Ambulatory Visit (INDEPENDENT_AMBULATORY_CARE_PROVIDER_SITE_OTHER): Payer: PPO | Admitting: Physician Assistant

## 2015-09-30 VITALS — BP 136/75 | HR 84 | Temp 96.9°F | Ht 68.0 in | Wt 186.2 lb

## 2015-09-30 DIAGNOSIS — B029 Zoster without complications: Secondary | ICD-10-CM

## 2015-09-30 DIAGNOSIS — I10 Essential (primary) hypertension: Secondary | ICD-10-CM | POA: Diagnosis not present

## 2015-09-30 DIAGNOSIS — E785 Hyperlipidemia, unspecified: Secondary | ICD-10-CM | POA: Diagnosis not present

## 2015-09-30 MED ORDER — VALACYCLOVIR HCL 1 G PO TABS: 1000.0000 mg | ORAL_TABLET | Freq: Two times a day (BID) | ORAL | 0 refills | Status: DC

## 2015-09-30 MED ORDER — BENAZEPRIL HCL 40 MG PO TABS: 40.0000 mg | ORAL_TABLET | Freq: Every day | ORAL | 1 refills | Status: DC

## 2015-09-30 MED ORDER — METFORMIN HCL 500 MG PO TABS: 500.0000 mg | ORAL_TABLET | Freq: Every day | ORAL | 1 refills | Status: DC

## 2015-09-30 MED ORDER — AMLODIPINE BESYLATE 5 MG PO TABS: 5.0000 mg | ORAL_TABLET | Freq: Every day | ORAL | 1 refills | Status: DC

## 2015-09-30 MED ORDER — METOPROLOL TARTRATE 100 MG PO TABS: 100.0000 mg | ORAL_TABLET | Freq: Every day | ORAL | 1 refills | Status: DC

## 2015-09-30 MED ORDER — ROSUVASTATIN CALCIUM 40 MG PO TABS: ORAL_TABLET | ORAL | 1 refills | Status: DC

## 2015-09-30 MED ORDER — LEVOTHYROXINE SODIUM 150 MCG PO TABS: 150.0000 ug | ORAL_TABLET | Freq: Every day | ORAL | 1 refills | Status: DC

## 2015-09-30 MED ORDER — ACETAMINOPHEN-CODEINE #3 300-30 MG PO TABS: 1.0000 | ORAL_TABLET | Freq: Four times a day (QID) | ORAL | 0 refills | Status: AC | PRN

## 2015-09-30 NOTE — Patient Instructions (Signed)

## 2015-09-30 NOTE — Progress Notes (Signed)
BP 136/75 (BP Location: Left Arm, Patient Position: Sitting, Cuff Size: Large)   Pulse 84   Temp (!) 96.9 F (36.1 C) (Oral)   Ht '5\' 8"'$  (1.727 m)   Wt 186 lb 3.2 oz (84.5 kg)   BMI 28.31 kg/m    Subjective:    Patient ID: Dillon Sweeney, male    DOB: 1932-04-25, 82 y.o.   MRN: [id number]  HPI: Dillon Sweeney is a 82 y.o. male presenting on 09/30/2015 for Herpes Zoster (? )   HPI he has had 1 day history of an outbreak from the 212 spine and around the left side of his flank. He describes it as mildly painful. The area is broken out with blisters on red bases. His wife saw the rash and recommended he get to the office as soon as possible. He did not receive a vaccine. He is given information about the infectious nature of shingles. He will avoid anyone in his life that is undergoing significant medical treatments.  Relevant past medical, surgical, family and social history reviewed and updated as indicated. Interim medical history since our last visit reviewed. Allergies and medications reviewed and updated. DATA REVIEWED: CHART IN EPIC  Review of Systems  Constitutional: Negative.  Negative for appetite change and fatigue.  HENT: Negative.   Eyes: Negative.  Negative for pain and visual disturbance.  Respiratory: Negative.  Negative for cough, chest tightness, shortness of breath and wheezing.   Cardiovascular: Negative.  Negative for chest pain, palpitations and leg swelling.  Gastrointestinal: Negative.  Negative for abdominal pain, diarrhea, nausea and vomiting.  Endocrine: Negative.   Genitourinary: Negative.   Musculoskeletal: Negative.   Skin: Positive for rash. Negative for color change.  Neurological: Negative.  Negative for weakness, numbness and headaches.  Psychiatric/Behavioral: Negative.     Per HPI unless specifically indicated above     Medication List       Accurate as of 09/30/15  9:49 AM. Always use your most recent med list.            acetaminophen-codeine 300-30 MG tablet Commonly known as:  TYLENOL #3 Take 1 tablet by mouth every 6 (six) hours as needed for severe pain.   amLODipine 5 MG tablet Commonly known as:  NORVASC Take 1 tablet (5 mg total) by mouth daily.   aspirin 81 MG tablet Take 81 mg by mouth daily.   benazepril 40 MG tablet Commonly known as:  LOTENSIN Take 1 tablet (40 mg total) by mouth daily.   fluticasone 50 MCG/ACT nasal spray Commonly known as:  FLONASE Place 2 sprays into both nostrils daily.   levothyroxine 150 MCG tablet Commonly known as:  SYNTHROID, LEVOTHROID Take 1 tablet (150 mcg total) by mouth daily.   metFORMIN 500 MG tablet Commonly known as:  GLUCOPHAGE Take 1 tablet (500 mg total) by mouth daily with breakfast.   metoprolol 100 MG tablet Commonly known as:  LOPRESSOR Take 1 tablet (100 mg total) by mouth daily.   multivitamin per tablet Take 1 tablet by mouth daily.   rosuvastatin 40 MG tablet Commonly known as:  CRESTOR TAKE ONE TABLET BY MOUTH EVERY DAY   TYLENOL PO Take 1-2 tablets by mouth every 6 (six) hours as needed.   valACYclovir 1000 MG tablet Commonly known as:  VALTREX Take 1 tablet (1,000 mg total) by mouth 2 (two) times daily.   Vitamin D 2000 units Caps Take by mouth.  Objective:    BP 136/75 (BP Location: Left Arm, Patient Position: Sitting, Cuff Size: Large)   Pulse 84   Temp (!) 96.9 F (36.1 C) (Oral)   Ht '5\' 8"'$  (5.366 m)   Wt 186 lb 3.2 oz (84.5 kg)   BMI 28.31 kg/m   Allergies  Allergen Reactions  . Morphine And Related     hallucinations    Wt Readings from Last 3 Encounters:  09/30/15 186 lb 3.2 oz (84.5 kg)  08/26/15 185 lb 9.6 oz (84.2 kg)  04/21/15 193 lb 6.4 oz (87.7 kg)    Physical Exam  Results for orders placed or performed in visit on 08/26/15  Bayer DCA Hb A1c Waived  Result Value Ref Range   Bayer DCA Hb A1c Waived 6.2 <7.0 %  Microalbumin / creatinine urine ratio  Result Value Ref Range    Creatinine, Urine 68.5 Not Estab. mg/dL   Microalbum.,U,Random 8.8 Not Estab. ug/mL   MICROALB/CREAT RATIO 12.8 0.0 - 30.0 mg/g creat      Assessment & Plan:   1. Shingles outbreak Information sheet given - valACYclovir (VALTREX) 1000 MG tablet; Take 1 tablet (1,000 mg total) by mouth 2 (two) times daily.  Dispense: 20 tablet; Refill: 0 - acetaminophen-codeine (TYLENOL #3) 300-30 MG tablet; Take 1 tablet by mouth every 6 (six) hours as needed for severe pain.  Dispense: 40 tablet; Refill: 0   Continue all other maintenance medications as listed above.  Follow up plan: No Follow-up on file. Follow up with Dr. Sabra Heck as planned.  Counseling provided for all of the age appropriate vaccines. No orders of the defined types were placed in this encounter.   Terald Sleeper PA-C [facility] [address]  [place], [place] [address] [phone]   09/30/2015, 9:49 AM

## 2015-09-30 NOTE — Addendum Note (Signed)
Addended by: Thana Ates on: 09/30/2015 04:25 PM   Modules accepted: Orders

## 2016-01-30 ENCOUNTER — Emergency Department (HOSPITAL_COMMUNITY)
Admission: EM | Admit: 2016-01-30 | Discharge: 2016-01-30 | Disposition: A | Discharge status: Home / Self Care | Payer: PPO | Attending: Emergency Medicine | Admitting: Emergency Medicine

## 2016-01-30 ENCOUNTER — Emergency Department (HOSPITAL_COMMUNITY): Payer: PPO

## 2016-01-30 ENCOUNTER — Encounter (HOSPITAL_COMMUNITY): Payer: Self-pay | Admitting: Emergency Medicine

## 2016-01-30 DIAGNOSIS — M542 Cervicalgia: Secondary | ICD-10-CM | POA: Diagnosis not present

## 2016-01-30 DIAGNOSIS — R42 Dizziness and giddiness: Secondary | ICD-10-CM

## 2016-01-30 DIAGNOSIS — Z951 Presence of aortocoronary bypass graft: Secondary | ICD-10-CM | POA: Insufficient documentation

## 2016-01-30 DIAGNOSIS — I2510 Atherosclerotic heart disease of native coronary artery without angina pectoris: Secondary | ICD-10-CM | POA: Diagnosis not present

## 2016-01-30 DIAGNOSIS — Z7984 Long term (current) use of oral hypoglycemic drugs: Secondary | ICD-10-CM | POA: Diagnosis not present

## 2016-01-30 DIAGNOSIS — E039 Hypothyroidism, unspecified: Secondary | ICD-10-CM | POA: Insufficient documentation

## 2016-01-30 DIAGNOSIS — I10 Essential (primary) hypertension: Secondary | ICD-10-CM | POA: Diagnosis not present

## 2016-01-30 DIAGNOSIS — Z7982 Long term (current) use of aspirin: Secondary | ICD-10-CM | POA: Insufficient documentation

## 2016-01-30 DIAGNOSIS — R519 Headache, unspecified: Secondary | ICD-10-CM

## 2016-01-30 DIAGNOSIS — R51 Headache: Secondary | ICD-10-CM | POA: Insufficient documentation

## 2016-01-30 DIAGNOSIS — E119 Type 2 diabetes mellitus without complications: Secondary | ICD-10-CM | POA: Diagnosis not present

## 2016-01-30 DIAGNOSIS — Z79899 Other long term (current) drug therapy: Secondary | ICD-10-CM | POA: Insufficient documentation

## 2016-01-30 LAB — CBC WITH DIFFERENTIAL/PLATELET
BASOS ABS: 0 10*3/uL (ref 0.0–0.1)
Basophils Relative: 1 %
Eosinophils Absolute: 0.5 10*3/uL (ref 0.0–0.7)
Eosinophils Relative: 9 %
HEMATOCRIT: 38.9 % — AB (ref 39.0–52.0)
Hemoglobin: 12.8 g/dL — ABNORMAL LOW (ref 13.0–17.0)
LYMPHS ABS: 1.5 10*3/uL (ref 0.7–4.0)
LYMPHS PCT: 26 %
MCH: 30 pg (ref 26.0–34.0)
MCHC: 32.9 g/dL (ref 30.0–36.0)
MCV: 91.3 fL (ref 78.0–100.0)
Monocytes Absolute: 0.4 10*3/uL (ref 0.1–1.0)
Monocytes Relative: 7 %
NEUTROS ABS: 3.3 10*3/uL (ref 1.7–7.7)
Neutrophils Relative %: 57 %
Platelets: 173 10*3/uL (ref 150–400)
RBC: 4.26 MIL/uL (ref 4.22–5.81)
RDW: 17.3 % — ABNORMAL HIGH (ref 11.5–15.5)
WBC: 5.6 10*3/uL (ref 4.0–10.5)

## 2016-01-30 LAB — URINALYSIS, ROUTINE W REFLEX MICROSCOPIC
Bilirubin Urine: NEGATIVE
Glucose, UA: NEGATIVE mg/dL
Hgb urine dipstick: NEGATIVE
Ketones, ur: NEGATIVE mg/dL
LEUKOCYTES UA: NEGATIVE
NITRITE: NEGATIVE
Protein, ur: NEGATIVE mg/dL
SPECIFIC GRAVITY, URINE: 1.021 (ref 1.005–1.030)
pH: 7 (ref 5.0–8.0)

## 2016-01-30 LAB — I-STAT TROPONIN, ED: Troponin i, poc: 0.01 ng/mL (ref 0.00–0.08)

## 2016-01-30 LAB — BASIC METABOLIC PANEL
ANION GAP: 8 (ref 5–15)
BUN: 14 mg/dL (ref 6–20)
CHLORIDE: 102 mmol/L (ref 101–111)
CO2: 25 mmol/L (ref 22–32)
Calcium: 9.4 mg/dL (ref 8.9–10.3)
Creatinine, Ser: 0.99 mg/dL (ref 0.61–1.24)
GFR calc Af Amer: >60 mL/min (ref >60)
GLUCOSE: 154 mg/dL — AB (ref 65–99)
POTASSIUM: 4 mmol/L (ref 3.5–5.1)
SODIUM: 135 mmol/L (ref 135–145)

## 2016-01-30 MED ORDER — IOPAMIDOL (ISOVUE-370) INJECTION 76%
75.0000 mL | Freq: Once | INTRAVENOUS | Status: AC | PRN
  Administered 2016-01-30: 75 mL via INTRAVENOUS

## 2016-01-30 MED ORDER — ACETAMINOPHEN 500 MG PO TABS
1000.0000 mg | ORAL_TABLET | Freq: Once | ORAL | Status: AC
  Administered 2016-01-30: 1000 mg via ORAL
  Filled 2016-01-30: qty 2

## 2016-01-30 NOTE — ED Triage Notes (Addendum)
Patient brought in via EMS from home. Alert and oriented. Airway patent. Patient c/o dizziness with headache. Per patient went to bed last night at 8pm with out dizziness or headache, woke this morning at 4:30 with dizziness only and now has pain radiating from right shoulder into back of head. Denies any slurred speech or blurred vision but reports that the dizziness is upon standing and he has generalized weakness. Denies taking any medications for headache.

## 2016-01-30 NOTE — Discharge Instructions (Signed)
You have been seen in the Emergency Department (ED) for a headache.  Please use Tylenol or Motrin as needed for symptoms, but only as written on the box.  As we have discussed, please follow up with your primary care doctor as soon as possible regarding today?s Emergency Department (ED) visit and your headache symptoms.    We did find an abnormal blood vessel on the CT scan of your neck. You should call the radiologist on Monday, listed below, to schedule an outpatient appointment.   Call your doctor or return to the ED if you have a worsening headache, sudden and severe headache, confusion, slurred speech, facial droop, weakness or numbness in any arm or leg, extreme fatigue, vision problems, or other symptoms that concern you.

## 2016-01-30 NOTE — ED Provider Notes (Signed)
Emergency Department Provider Note  By signing my name below, I, Bea Graff, attest that this documentation has been prepared under the direction and in the presence of Margette Fast, MD. Electronically Signed: Bea Graff, ED Scribe. 01/30/16. 8:30 AM.   I have reviewed the triage vital signs and the nursing notes.   HISTORY  Chief Complaint Dizziness   HPI Dillon Sweeney is a 82 y.o. male with PMHx of CAD, DM, HTN, HLD and hypothyroidism brought in by EMS complaining of a severe HA that began this morning. He states when he woke up this morning about 4 hours ago he was light headed and generalized weakness. He did a few things and laid back down for a nap and woke up with the same symptoms, dizziness and a HA. He reports the HA began in the left sided neck and radiates into the center of his head. He states he took all his morning medications in hopes the symptoms would be relieved but they only worsened. He has not taken anything for pain. Sitting up on the edge of the bed increases his dizziness. He denies alleviating factors. He denies hemiparesis, nausea, vomiting, abdominal pain, SOB, CP, fever, chills, numbness, tingling or weakness of any extremity. He denies h/o CVA. He denies any medication changes. He denies drinking alcohol.   Past Medical History:  Diagnosis Date  . AAA (abdominal aortic aneurysm) ([place])   . Atherosclerosis   . Bladder stones   . CAD (coronary artery disease)   . Cholelithiasis   . Chronic constipation   . Diabetes mellitus    Type 2  . Dyslipidemia   . Hyperlipidemia   . Hypertension   . Hypothyroidism   . Myocardial infarction 09/22/10   1986  . Pneumonia 2012    Patient Active Problem List   Diagnosis Date Noted  . GERD (gastroesophageal reflux disease) 09/13/2013  . Atherosclerosis   . Cholelithiasis   . Bladder stones   . Hyperlipidemia   . Diabetes ([place]) 08/06/2012  . HTN (hypertension) 08/06/2012  . Unspecified  vitamin D deficiency 08/06/2012  . Hypothyroidism 08/06/2012  . Tobacco use disorder 08/06/2012  . Abdominal aortic aneurysm 10/13/2011  . Myocardial infarction 09/22/2010    Past Surgical History:  Procedure Laterality Date  . ABDOMINAL AORTIC ANEURYSM REPAIR  09/15/10   Juxtarenal AAA repair   . APPENDECTOMY    . CORONARY ARTERY BYPASS GRAFT  1986   left leg vein harvest  . TONSILLECTOMY      Current Outpatient Rx  . Order #: [id number] Class: Historical Med  . Order #: [id number] Class: Normal  . Order #: [id number] Class: Historical Med  . Order #: [id number] Class: Normal  . Order #: [id number] Class: Historical Med  . Order #: [id number] Class: Normal  . Order #: [id number] Class: Normal  . Order #: [id number] Class: Historical Med  . Order #: [id number] Class: Normal  . Order #: [id number] Class: Normal  . Order #: [id number] Class: Normal    Allergies Morphine and related  Family History  Problem Relation Age of Onset  . Stroke Father   . Cancer Sister     Social History Social History  Substance Use Topics  . Smoking status: Former Smoker    Packs/day: 1.00    Types: Cigarettes    Quit date: 09/10/2010  . Smokeless tobacco: Never Used  . Alcohol use No    Review of Systems  Constitutional: No fever/chills Eyes: No visual changes. ENT: No sore throat. Cardiovascular: Denies chest pain. Respiratory: Denies shortness  of breath. Gastrointestinal: No abdominal pain.  No nausea, no vomiting.  No diarrhea.  No constipation. Genitourinary: Negative for dysuria. Musculoskeletal: Negative for back pain. Skin: Negative for rash. Neurological: Positive for headaches. Negative for focal weakness or numbness.  10-point ROS otherwise negative.  ____________________________________________   PHYSICAL EXAM:  VITAL SIGNS: ED Triage Vitals  Enc Vitals Group     BP 01/30/16 0815 169/82     Pulse Rate 01/30/16 0815 (!) 58     Resp 01/30/16 0815 20     Temp 01/30/16 0815 97.9 F  (36.6 C)     Temp Source 01/30/16 0815 Oral     SpO2 01/30/16 0815 95 %     Weight 01/30/16 0815 182 lb (82.6 kg)     Height 01/30/16 0815 '5\' 9"'$  (1.753 m)     Pain Score 01/30/16 0812 7   Constitutional: Alert and oriented. Well appearing and in no acute distress. Eyes: Conjunctivae are normal. PERRL. EOMI. Head: Atraumatic. Nose: No congestion/rhinnorhea. Mouth/Throat: Mucous membranes are moist.  Oropharynx non-erythematous. Neck: No stridor.   Cardiovascular: Normal rate, regular rhythm. Good peripheral circulation. Grossly normal heart sounds.   Respiratory: Normal respiratory effort.  No retractions. Lungs CTAB. Gastrointestinal: Soft and nontender. No distention.  Musculoskeletal: No lower extremity tenderness nor edema. No gross deformities of extremities. Neurologic:  Normal speech and language. No gross focal neurologic deficits are appreciated. No pronator drift. Normal finger-to-nose testing, normal heel to shin.  Skin:  Skin is warm, dry and intact. No rash noted. Psychiatric: Mood and affect are normal. Speech and behavior are normal.  ____________________________________________   LABS (all labs ordered are listed, but only abnormal results are displayed)  Labs Reviewed  BASIC METABOLIC PANEL - Abnormal; Notable for the following:       Result Value   Glucose, Bld 154 (*)    All other components within normal limits  CBC WITH DIFFERENTIAL/PLATELET - Abnormal; Notable for the following:    Hemoglobin 12.8 (*)    HCT 38.9 (*)    RDW 17.3 (*)    All other components within normal limits  URINALYSIS, ROUTINE W REFLEX MICROSCOPIC - Abnormal; Notable for the following:    Color, Urine STRAW (*)    All other components within normal limits  I-STAT TROPOININ, ED   ____________________________________________  EKG   EKG Interpretation  Date/Time:  Saturday January 30 2016 08:10:47 EST Ventricular Rate:  60 PR Interval:    QRS Duration: 159 QT  Interval:  457 QTC Calculation: 457 R Axis:   99 Text Interpretation:  Sinus rhythm Multiple ventricular premature complexes Prolonged PR interval RBBB and LPFB Inferior infarct, age indeterminate Baseline wander in lead(s) II V2 No STEMI. Similar to prior.  Confirmed by LONG MD, JOSHUA [phone]) on 01/30/2016 8:18:29 AM       ____________________________________________  RADIOLOGY  Ct Head Wo Contrast  Result Date: 01/30/2016 CLINICAL DATA:  Headache, neck pain EXAM: CT HEAD WITHOUT CONTRAST TECHNIQUE: Contiguous axial images were obtained from the base of the skull through the vertex without intravenous contrast. COMPARISON:  09/08/2010 FINDINGS: Brain: Mild cerebral atrophy. No acute intracranial abnormality. Specifically, no hemorrhage, hydrocephalus, mass lesion, acute infarction, or significant intracranial injury. Vascular: No hyperdense vessel or unexpected calcification. Skull: No acute calvarial abnormality. Sinuses/Orbits: Visualized paranasal sinuses and mastoids clear. Orbital soft tissues unremarkable. Other: None IMPRESSION: No acute intracranial abnormality. Electronically Signed   By: Rolm Baptise M.D.   On: 01/30/2016 11:22   Ct Angio Neck W And/or  Wo Contrast  Result Date: 01/30/2016 CLINICAL DATA:  82 year old male with vertigo and right neck pain. Initial encounter. EXAM: CT ANGIOGRAPHY NECK TECHNIQUE: Multidetector CT imaging of the neck was performed using the standard protocol during bolus administration of intravenous contrast. Multiplanar CT image reconstructions and MIPs were obtained to evaluate the vascular anatomy. Carotid stenosis measurements (when applicable) are obtained utilizing NASCET criteria, using the distal internal carotid diameter as the denominator. CONTRAST:  75 mL Isovue 370 COMPARISON:  Noncontrast head CT from today reported separately. CT head without contrast 09/08/2010. FINDINGS: Skeleton: Carious dentition. Mild degenerative appearing  anterolisthesis in the midcervical spine with chronic facet hypertrophy. Lower cervical spine chronic disc and endplate degeneration. No acute osseous abnormality identified. Prior median sternotomy. Visualized paranasal sinuses and mastoids are stable and well pneumatized. Upper chest: Centrilobular emphysema. Dependent atelectasis along the left major fissure. No superior mediastinal lymphadenopathy. Other neck: Negative thyroid, larynx, pharynx, parapharyngeal spaces, retropharyngeal space, sublingual space, submandibular glands and parotid glands. No cervical lymphadenopathy. Aortic arch: 3 vessel arch configuration with extensive arch atherosclerosis, mostly calcified but also some soft plaque. Right carotid system: No brachiocephalic artery or right CCA origin stenosis despite calcified plaque. Tortuous proximal right CCA. Intermittent right CCA soft and calcified plaque without stenosis proximal to the bifurcation. At the right carotid bifurcation there is concentric calcified plaque affecting the right ICA origin and bulb. Stenosis is estimated at 50 % with respect to the distal vessel. Distal to the bulb there is severe tortuosity of the right ICA at the C1 level. This has a mildly kinked appearance. There is mild associated calcified plaque but no stenosis. The visible right ICA siphon is patent with calcified plaque but no visible high-grade stenosis. Left carotid system: Left CCA origin stenosis related to calcified plaque estimated at 50 % with respect to the distal vessel. Tortuous proximal left CCA. At the left carotid bifurcation there is bulky soft and calcified plaque resulting in stenosis estimated at 70% stenosis with respect to the distal vessel. Calcified plaque continues into the bulb but is not hemodynamically significant. Severe tortuosity of the vessel at the C1 level with calcified plaque but no subsequent stenosis. The visible left ICA siphon is patent with calcified plaque but no  high-grade stenosis. Vertebral arteries: No proximal right subclavian artery stenosis despite calcified plaque. Calcified plaque near the right vertebral artery origin with mild origin stenosis. No cervical right vertebral artery stenosis. Intracranially the right vertebral V4 segment tapers without focal stenosis. There is abnormal vessel density along the right cervicomedullary junction and right C1 cervical spinal cord. There is an abnormally beaded appearance PICA vessels in the area. The right PICA is difficult to delineate separately from this area. The left PICA has an early takeoff but also appears to be involved. See series 11 images 27 through 41. There are asymmetric draining veins associated. The changes appear to beyond the surface of rather than within the affected lower brainstem and spinal cord. No proximal left subclavian artery stenosis despite extensive calcified plaque. There is calcified plaque at the left vertebral artery origin with up to moderate stenosis. The left vertebral artery is dominant and dolichoectatic. There is left V3 and V4 segment calcified atherosclerosis but no other hemodynamically significant stenosis. The left vertebral primarily supplies the basilar. The visible basilar artery is patent. AICA origins are normal. IMPRESSION: 1. 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 Recommend Neuro endovascular patient follow-up. 2. Positive for extensive calcified atherosclerosis of the aortic arch and proximal great vessels. Fairly extensive bilateral carotid atherosclerosis, predominantly calcified. The only hemodynamically significant carotid stenosis identified is at the Left ICA origin where soft and bulky calcified plaque are estimated to result in up to 70% stenosis. 3. Dominant and somewhat dolichoectatic  left vertebral artery with moderate origin stenosis due to calcified plaque. The non dominant right vertebral artery has a fairly normal vertebral caliber and has mild origin stenosis due to calcified plaque. 4. Emphysema.  Poor dentition. Electronically Signed   By: Genevie Ann M.D.   On: 01/30/2016 11:47    ____________________________________________   PROCEDURES  Procedure(s) performed:   Procedures  None ____________________________________________   INITIAL IMPRESSION / ASSESSMENT AND PLAN / ED COURSE  Pertinent labs & imaging results that were available during my care of the patient were reviewed by me and considered in my medical decision making (see chart for details).  Patient resents to the emergency department for evaluation of right neck pain and headache that started this morning. Patient has some associated generalized weakness and intermittent vertigo. Vertigo symptoms are reportedly made worse with sitting on the edge of the bed. No change in symptoms with moving her head side to side. No symptoms at rest. The patient has a mild headache at this time with continued right-sided neck discomfort. No fevers or chills to suggest infectious etiology. I'm unable to elicit symptoms with Dix-Hallpike maneuver. No focal neurological deficits on exam. My suspicion for central vertigo is relatively low in this patient given normal neuro exam. Plan for CT scan of the head and CT angiogram of the neck to evaluate for possible artery dissection. Plan to give Tylenol and follow baseline labs. EKG is unchanged from prior side very low suspicion for atypical ACS presentation. Plan for ambulation after CT imaging.   12:15 PM Spoke with Radiologist Dr. Nevada Crane regarding the CT angiogram the neck. AVM is likely chronic. No aneurysm or dissection. Recommends Neuro-interventional radiology follow up as outpatient. Patient is pain-free and ambulatory in the ED without gait abnormality. No vertigo symptoms.  Will refer back to PCP and Dr. Estanislado Pandy for follow up neuro intervention. Family will call on Monday.     I personally performed the services described in this documentation, which was scribed in my presence. The recorded information has been reviewed and is accurate.    ____________________________________________  FINAL CLINICAL IMPRESSION(S) / ED DIAGNOSES  Final diagnoses:  Dizziness  Acute nonintractable headache, unspecified headache type  Neck pain     MEDICATIONS GIVEN DURING THIS VISIT:  Medications  acetaminophen (TYLENOL) tablet 1,000 mg (1,000 mg Oral Given 01/30/16 0840)  iopamidol (ISOVUE-370) 76 % injection 75 mL (75 mLs Intravenous Contrast Given 01/30/16 1025)     NEW OUTPATIENT MEDICATIONS STARTED DURING THIS VISIT:  None   Note:  This document was prepared using Dragon voice recognition software and may include unintentional dictation errors.  Nanda Quinton, MD Emergency Medicine    Margette Fast, MD 01/31/16 [phone]

## 2016-01-30 NOTE — ED Notes (Signed)
Per EMS blood sugar 150.

## 2016-02-04 ENCOUNTER — Encounter: Payer: Self-pay | Admitting: Family Medicine

## 2016-02-04 ENCOUNTER — Ambulatory Visit (INDEPENDENT_AMBULATORY_CARE_PROVIDER_SITE_OTHER): Payer: Medicare Other | Admitting: Family Medicine

## 2016-02-04 ENCOUNTER — Other Ambulatory Visit (HOSPITAL_COMMUNITY): Payer: Self-pay | Admitting: Interventional Radiology

## 2016-02-04 VITALS — BP 143/69 | HR 57 | Temp 97.1°F | Ht 69.0 in | Wt 191.0 lb

## 2016-02-04 DIAGNOSIS — Z09 Encounter for follow-up examination after completed treatment for conditions other than malignant neoplasm: Secondary | ICD-10-CM | POA: Diagnosis not present

## 2016-02-04 DIAGNOSIS — R51 Headache: Secondary | ICD-10-CM | POA: Diagnosis not present

## 2016-02-04 DIAGNOSIS — R519 Headache, unspecified: Secondary | ICD-10-CM

## 2016-02-04 DIAGNOSIS — J189 Pneumonia, unspecified organism: Secondary | ICD-10-CM | POA: Diagnosis not present

## 2016-02-04 DIAGNOSIS — Y95 Nosocomial condition: Secondary | ICD-10-CM

## 2016-02-04 DIAGNOSIS — I771 Stricture of artery: Secondary | ICD-10-CM

## 2016-02-04 NOTE — Progress Notes (Signed)
Subjective:    Patient ID: Dillon Sweeney, male    DOB: 04-04-1932, 83 y.o.   MRN: [id number]  HPI  Patient here today for hospital follow up from [facility], where he was seen for dizziness, HA and neck pain.  Dizziness had subsided by the time he rode the ambulance to the hospital. He had several studies in the emergency room that were inconclusive but showed that he might have    Patient Active Problem List   Diagnosis Date Noted  . GERD (gastroesophageal reflux disease) 09/13/2013  . Atherosclerosis   . Cholelithiasis   . Bladder stones   . Hyperlipidemia   . Diabetes ([place]) 08/06/2012  . HTN (hypertension) 08/06/2012  . Unspecified vitamin D deficiency 08/06/2012  . Hypothyroidism 08/06/2012  . Tobacco use disorder 08/06/2012  . Abdominal aortic aneurysm 10/13/2011  . Myocardial infarction 09/22/2010   Outpatient Encounter Prescriptions as of 02/04/2016  Medication Sig  . Acetaminophen (TYLENOL PO) Take 1-2 tablets by mouth every 6 (six) hours as needed.    Marland Kitchen amLODipine (NORVASC) 5 MG tablet Take 1 tablet (5 mg total) by mouth daily.  Marland Kitchen aspirin 81 MG tablet Take 81 mg by mouth daily.    . benazepril (LOTENSIN) 40 MG tablet Take 1 tablet (40 mg total) by mouth daily.  . Cholecalciferol (VITAMIN D) 2000 UNITS CAPS Take by mouth.  . levothyroxine (SYNTHROID, LEVOTHROID) 150 MCG tablet Take 1 tablet (150 mcg total) by mouth daily.  . metFORMIN (GLUCOPHAGE) 500 MG tablet Take 1 tablet (500 mg total) by mouth daily with breakfast.  . metoprolol (LOPRESSOR) 100 MG tablet Take 1 tablet (100 mg total) by mouth daily.  . multivitamin (THERAGRAN) per tablet Take 1 tablet by mouth daily.    . rosuvastatin (CRESTOR) 40 MG tablet TAKE ONE TABLET BY MOUTH EVERY DAY  . valACYclovir (VALTREX) 1000 MG tablet Take 1 tablet (1,000 mg total) by mouth 2 (two) times daily.   No facility-administered encounter medications on file as of 02/04/2016.      Review of Systems  Constitutional:  Positive for fatigue.  HENT: Negative.   Eyes: Negative.   Respiratory: Negative.   Cardiovascular: Negative.   Gastrointestinal: Negative.   Endocrine: Negative.   Genitourinary: Negative.   Musculoskeletal: Negative.   Skin: Negative.   Allergic/Immunologic: Negative.   Neurological: Positive for headaches.  Hematological: Negative.   Psychiatric/Behavioral: Negative.        Objective:   Physical Exam  Constitutional: He is oriented to person, place, and time. He appears well-developed and well-nourished.  Neck:  Unable to palpate left carotid. No bruits were appreciated  Cardiovascular: Normal rate and regular rhythm.   Neurological: He is alert and oriented to person, place, and time.  Negative Romberg and able to perform tandem gait   BP (!) 143/69 (BP Location: Right Arm)   Pulse (!) 57   Temp 97.1 F (36.2 C) (Oral)   Ht '5\' 9"'$  (1.753 m)   Wt 191 lb (86.6 kg)   BMI 28.21 kg/m         Assessment & Plan:  1. Hospital discharge follow-up Asian is asymptomatic now. I am not at all certain he needs further studies but in his mind he was told to follow-up with interventional radiology so will help him with an appointment - Ambulatory referral to Interventional Radiology    3. Nonintractable headache, unspecified chronicity pattern, unspecified headache type Headache is not a major problem and is controlled with acetaminophen  Wardell Honour MD - Ambulatory referral to Interventional Radiology

## 2016-02-15 ENCOUNTER — Ambulatory Visit (HOSPITAL_COMMUNITY): Payer: PPO

## 2016-02-15 ENCOUNTER — Other Ambulatory Visit (HOSPITAL_COMMUNITY): Payer: Self-pay | Admitting: Interventional Radiology

## 2016-02-15 ENCOUNTER — Telehealth (HOSPITAL_COMMUNITY): Payer: Self-pay

## 2016-02-15 DIAGNOSIS — I771 Stricture of artery: Secondary | ICD-10-CM

## 2016-02-15 NOTE — Telephone Encounter (Signed)
Called to schedule angio, left message for pt to return call. AW

## 2016-02-29 ENCOUNTER — Other Ambulatory Visit: Payer: Self-pay | Admitting: *Deleted

## 2016-02-29 DIAGNOSIS — I10 Essential (primary) hypertension: Secondary | ICD-10-CM

## 2016-02-29 MED ORDER — AMLODIPINE BESYLATE 5 MG PO TABS: 5.0000 mg | ORAL_TABLET | Freq: Every day | ORAL | 1 refills | Status: DC

## 2016-02-29 MED ORDER — METOPROLOL TARTRATE 100 MG PO TABS: 100.0000 mg | ORAL_TABLET | Freq: Every day | ORAL | 1 refills | Status: DC

## 2016-02-29 MED ORDER — LEVOTHYROXINE SODIUM 150 MCG PO TABS: 150.0000 ug | ORAL_TABLET | Freq: Every day | ORAL | 1 refills | Status: DC

## 2016-02-29 MED ORDER — METFORMIN HCL 500 MG PO TABS: 500.0000 mg | ORAL_TABLET | Freq: Every day | ORAL | 1 refills | Status: DC

## 2016-02-29 MED ORDER — ROSUVASTATIN CALCIUM 40 MG PO TABS: ORAL_TABLET | ORAL | 1 refills | Status: DC

## 2016-02-29 MED ORDER — BENAZEPRIL HCL 40 MG PO TABS: 40.0000 mg | ORAL_TABLET | Freq: Every day | ORAL | 1 refills | Status: DC

## 2016-03-03 ENCOUNTER — Other Ambulatory Visit: Payer: Self-pay | Admitting: General Surgery

## 2016-03-03 ENCOUNTER — Other Ambulatory Visit: Payer: Self-pay | Admitting: Physician Assistant

## 2016-03-04 ENCOUNTER — Other Ambulatory Visit (HOSPITAL_COMMUNITY): Payer: Self-pay | Admitting: Interventional Radiology

## 2016-03-04 ENCOUNTER — Encounter (HOSPITAL_COMMUNITY): Payer: Self-pay

## 2016-03-04 ENCOUNTER — Ambulatory Visit: Payer: PPO | Admitting: Family Medicine

## 2016-03-04 ENCOUNTER — Ambulatory Visit (HOSPITAL_COMMUNITY)
Admission: RE | Admit: 2016-03-04 | Discharge: 2016-03-04 | Disposition: A | Discharge status: Home / Self Care | Payer: Medicare Other | Source: Ambulatory Visit | Attending: Interventional Radiology | Admitting: Interventional Radiology

## 2016-03-04 DIAGNOSIS — I700 Atherosclerosis of aorta: Secondary | ICD-10-CM | POA: Diagnosis not present

## 2016-03-04 DIAGNOSIS — G450 Vertebro-basilar artery syndrome: Secondary | ICD-10-CM | POA: Insufficient documentation

## 2016-03-04 DIAGNOSIS — Z87891 Personal history of nicotine dependence: Secondary | ICD-10-CM | POA: Insufficient documentation

## 2016-03-04 DIAGNOSIS — I252 Old myocardial infarction: Secondary | ICD-10-CM | POA: Insufficient documentation

## 2016-03-04 DIAGNOSIS — Z7984 Long term (current) use of oral hypoglycemic drugs: Secondary | ICD-10-CM | POA: Insufficient documentation

## 2016-03-04 DIAGNOSIS — K5909 Other constipation: Secondary | ICD-10-CM | POA: Insufficient documentation

## 2016-03-04 DIAGNOSIS — I771 Stricture of artery: Secondary | ICD-10-CM

## 2016-03-04 DIAGNOSIS — E119 Type 2 diabetes mellitus without complications: Secondary | ICD-10-CM | POA: Diagnosis not present

## 2016-03-04 DIAGNOSIS — I2510 Atherosclerotic heart disease of native coronary artery without angina pectoris: Secondary | ICD-10-CM | POA: Insufficient documentation

## 2016-03-04 DIAGNOSIS — E785 Hyperlipidemia, unspecified: Secondary | ICD-10-CM | POA: Insufficient documentation

## 2016-03-04 DIAGNOSIS — Q279 Congenital malformation of peripheral vascular system, unspecified: Secondary | ICD-10-CM | POA: Diagnosis not present

## 2016-03-04 DIAGNOSIS — I10 Essential (primary) hypertension: Secondary | ICD-10-CM | POA: Diagnosis not present

## 2016-03-04 DIAGNOSIS — J439 Emphysema, unspecified: Secondary | ICD-10-CM | POA: Diagnosis not present

## 2016-03-04 DIAGNOSIS — Z951 Presence of aortocoronary bypass graft: Secondary | ICD-10-CM | POA: Insufficient documentation

## 2016-03-04 DIAGNOSIS — E039 Hypothyroidism, unspecified: Secondary | ICD-10-CM | POA: Diagnosis not present

## 2016-03-04 DIAGNOSIS — Z7982 Long term (current) use of aspirin: Secondary | ICD-10-CM | POA: Diagnosis not present

## 2016-03-04 DIAGNOSIS — Z823 Family history of stroke: Secondary | ICD-10-CM | POA: Insufficient documentation

## 2016-03-04 HISTORY — PX: IR GENERIC HISTORICAL: IMG1180011

## 2016-03-04 LAB — CBC
HCT: 38.4 % — ABNORMAL LOW (ref 39.0–52.0)
HEMOGLOBIN: 12.6 g/dL — AB (ref 13.0–17.0)
MCH: 29.2 pg (ref 26.0–34.0)
MCHC: 32.8 g/dL (ref 30.0–36.0)
MCV: 88.9 fL (ref 78.0–100.0)
Platelets: 191 10*3/uL (ref 150–400)
RBC: 4.32 MIL/uL (ref 4.22–5.81)
RDW: 17.4 % — ABNORMAL HIGH (ref 11.5–15.5)
WBC: 5.6 10*3/uL (ref 4.0–10.5)

## 2016-03-04 LAB — BASIC METABOLIC PANEL
ANION GAP: 9 (ref 5–15)
BUN: 10 mg/dL (ref 6–20)
CALCIUM: 9.1 mg/dL (ref 8.9–10.3)
CO2: 23 mmol/L (ref 22–32)
Chloride: 103 mmol/L (ref 101–111)
Creatinine, Ser: 1.05 mg/dL (ref 0.61–1.24)
Glucose, Bld: 131 mg/dL — ABNORMAL HIGH (ref 65–99)
Potassium: 5.7 mmol/L — ABNORMAL HIGH (ref 3.5–5.1)
Sodium: 135 mmol/L (ref 135–145)

## 2016-03-04 LAB — PROTIME-INR
INR: 1.11
PROTHROMBIN TIME: 14.4 s (ref 11.4–15.2)

## 2016-03-04 LAB — APTT: aPTT: 29 seconds (ref 24–36)

## 2016-03-04 MED ORDER — HEPARIN SODIUM (PORCINE) 1000 UNIT/ML IJ SOLN
INTRAMUSCULAR | Status: AC
  Filled 2016-03-04: qty 2

## 2016-03-04 MED ORDER — MIDAZOLAM HCL 2 MG/2ML IJ SOLN
INTRAMUSCULAR | Status: AC
  Filled 2016-03-04: qty 2

## 2016-03-04 MED ORDER — SODIUM CHLORIDE 0.9 % IV SOLN
INTRAVENOUS | Status: AC | PRN
  Administered 2016-03-04: 75 mL/h via INTRAVENOUS

## 2016-03-04 MED ORDER — IOPAMIDOL (ISOVUE-300) INJECTION 61%
INTRAVENOUS | Status: AC
  Administered 2016-03-04: 10 mL
  Filled 2016-03-04: qty 50

## 2016-03-04 MED ORDER — FENTANYL CITRATE (PF) 100 MCG/2ML IJ SOLN
INTRAMUSCULAR | Status: AC | PRN
  Administered 2016-03-04: 25 ug via INTRAVENOUS

## 2016-03-04 MED ORDER — MIDAZOLAM HCL 2 MG/2ML IJ SOLN
INTRAMUSCULAR | Status: AC | PRN
  Administered 2016-03-04: 1 mg via INTRAVENOUS

## 2016-03-04 MED ORDER — IOPAMIDOL (ISOVUE-300) INJECTION 61%
INTRAVENOUS | Status: AC
  Administered 2016-03-04: 80 mL
  Filled 2016-03-04: qty 50

## 2016-03-04 MED ORDER — HEPARIN SODIUM (PORCINE) 1000 UNIT/ML IJ SOLN
INTRAMUSCULAR | Status: AC | PRN
  Administered 2016-03-04: 1000 [IU] via INTRAVENOUS
  Administered 2016-03-04: 500 [IU] via INTRAVENOUS

## 2016-03-04 MED ORDER — SODIUM CHLORIDE 0.9 % IV SOLN: INTRAVENOUS | Status: AC

## 2016-03-04 MED ORDER — LIDOCAINE HCL 1 % IJ SOLN
INTRAMUSCULAR | Status: AC | PRN
  Administered 2016-03-04: 10 mL

## 2016-03-04 MED ORDER — LIDOCAINE HCL 1 % IJ SOLN
INTRAMUSCULAR | Status: AC
  Filled 2016-03-04: qty 20

## 2016-03-04 MED ORDER — FENTANYL CITRATE (PF) 100 MCG/2ML IJ SOLN
INTRAMUSCULAR | Status: AC
  Filled 2016-03-04: qty 2

## 2016-03-04 NOTE — H&P (Signed)
Chief Complaint: Patient was seen in consultation today for cerebral arteriogram at the request of Dr Alain Honey  Referring Physician(s): Dr Ammie Ferrier  Supervising Physician: Luanne Bras  Patient Status: [place]  History of Present Illness: Dillon Sweeney is a 83 y.o. male   Pt experienced rt neck pain; dizziness; headache 12/30 Denies N/V; denies numbness; tingling; speech or vision changes  Work up 01/30/16: CTA IMPRESSION: 1. 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 Recommend Neuro endovascular patient follow-up. 2. Positive for extensive calcified atherosclerosis of the aortic arch and proximal great vessels. Fairly extensive bilateral carotid atherosclerosis, predominantly calcified. The only hemodynamically significant carotid stenosis identified is at the Left ICA origin where soft and bulky calcified plaque are estimated to result in up to 70% stenosis. 3. Dominant and somewhat dolichoectatic left vertebral artery with moderate origin stenosis due to calcified plaque. The non dominant right vertebral artery has a fairly normal vertebral caliber and has mild origin stenosis due to calcified plaque. 4. Emphysema.  Poor dentition  Pt now only with occasional frontal headache Denies neck pain or dizziness Scheduled now for cerebral arteriogram for evaluation of CT findings  Past Medical History:  Diagnosis Date  . AAA (abdominal aortic aneurysm) ([place])   . Atherosclerosis   . Bladder stones   . CAD (coronary artery disease)   . Cholelithiasis   . Chronic constipation   . Diabetes mellitus    Type 2  . Dyslipidemia   . Hyperlipidemia   . Hypertension   . Hypothyroidism   . Myocardial infarction 09/22/10   1986  . Pneumonia 2012    Past Surgical History:    Procedure Laterality Date  . ABDOMINAL AORTIC ANEURYSM REPAIR  09/15/10   Juxtarenal AAA repair   . APPENDECTOMY    . CORONARY ARTERY BYPASS GRAFT  1986   left leg vein harvest  . TONSILLECTOMY      Allergies: Morphine and related  Medications: Prior to Admission medications   Medication Sig Start Date End Date Taking? Authorizing Provider  acetaminophen (TYLENOL) 500 MG tablet Take 1,000 mg by mouth every 6 (six) hours as needed for mild pain or moderate pain.   Yes Historical Provider, MD  amLODipine (NORVASC) 5 MG tablet Take 1 tablet (5 mg total) by mouth daily. 02/29/16  Yes Timmothy Euler, MD  aspirin 81 MG tablet Take 81 mg by mouth daily.     Yes Historical Provider, MD  benazepril (LOTENSIN) 40 MG tablet Take 1 tablet (40 mg total) by mouth daily. 02/29/16  Yes Timmothy Euler, MD  Cholecalciferol (VITAMIN D) 2000 UNITS CAPS Take 1 capsule by mouth daily.    Yes Historical Provider, MD  levothyroxine (SYNTHROID, LEVOTHROID) 150 MCG tablet Take 1 tablet (150 mcg total) by mouth daily. 02/29/16  Yes Timmothy Euler, MD  metoprolol (LOPRESSOR) 100 MG tablet Take 1 tablet (100 mg total) by mouth daily. 02/29/16  Yes Timmothy Euler, MD  rosuvastatin (CRESTOR) 40 MG tablet TAKE ONE TABLET BY MOUTH EVERY DAY 02/29/16  Yes Timmothy Euler, MD  metFORMIN (GLUCOPHAGE) 500 MG tablet Take 1 tablet (500 mg total) by mouth daily with breakfast. 02/29/16   Timmothy Euler, MD     Family History  Problem Relation Age of Onset  . Stroke Father   .  Cancer Sister     Social History   Social History  . Marital status: Married    Spouse name: N/A  . Number of children: N/A  . Years of education: N/A   Social History Main Topics  . Smoking status: Former Smoker    Packs/day: 1.00    Types: Cigarettes    Quit date: 09/10/2010  . Smokeless tobacco: Never Used  . Alcohol use No  . Drug use: No  . Sexual activity: Not Asked   Other Topics Concern  . None   Social History  Narrative  . None    Review of Systems: A 12 point ROS discussed and pertinent positives are indicated in the HPI above.  All other systems are negative.  Review of Systems  Constitutional: Negative for activity change, appetite change, fatigue and fever.  HENT: Negative for tinnitus, trouble swallowing and voice change.   Eyes: Negative for visual disturbance.  Respiratory: Negative for cough and shortness of breath.   Cardiovascular: Negative for chest pain.  Gastrointestinal: Negative for abdominal pain.  Musculoskeletal: Positive for back pain. Negative for gait problem.  Neurological: Positive for headaches. Negative for dizziness, tremors, seizures, syncope, facial asymmetry, speech difficulty, weakness, light-headedness and numbness.  Psychiatric/Behavioral: Negative for behavioral problems and confusion.    Vital Signs: BP (!) 166/64   Pulse (!) 56   Temp 98.7 F (37.1 C)   Resp 18   Ht '5\' 9"'$  (1.753 m)   Wt 186 lb (84.4 kg)   SpO2 98%   BMI 27.47 kg/m   Physical Exam  Constitutional: He is oriented to person, place, and time. He appears well-nourished.  HENT:  Head: Atraumatic.  Eyes: EOM are normal.  Neck: Neck supple.  Cardiovascular: Normal rate, regular rhythm and normal heart sounds.   Pulmonary/Chest: Effort normal and breath sounds normal.  Abdominal: Soft. Bowel sounds are normal. There is no tenderness.  Musculoskeletal: Normal range of motion. He exhibits no edema.  Neurological: He is alert and oriented to person, place, and time.  Skin: Skin is warm and dry.  Psychiatric: He has a normal mood and affect. His behavior is normal. Judgment and thought content normal.  Nursing note and vitals reviewed.   Mallampati Score:  MD Evaluation Airway: WNL Heart: WNL Abdomen: WNL Chest/ Lungs: WNL ASA  Classification: 3 Mallampati/Airway Score: One  Imaging: No results found.  Labs:  CBC:  Recent Labs  01/30/16 0909 03/04/16 0822  WBC 5.6 5.6   HGB 12.8* 12.6*  HCT 38.9* 38.4*  PLT 173 191    COAGS: No results for input(s): INR, APTT in the last 8760 hours.  BMP:  Recent Labs  04/21/15 0807 01/30/16 0909 03/04/16 0822  NA 139 135 135  K 4.0 4.0 5.7*  CL 101 102 103  CO2 '22 25 23  '$ GLUCOSE 113* 154* 131*  BUN '12 14 10  '$ CALCIUM 9.2 9.4 9.1  CREATININE 0.91 0.99 1.05  GFRNONAA 79 >60 >60  GFRAA 91 >60 >60    LIVER FUNCTION TESTS:  Recent Labs  04/21/15 0807  BILITOT 0.3  AST 22  ALT 23  ALKPHOS 48  PROT 6.9  ALBUMIN 4.2    TUMOR MARKERS: No results for input(s): AFPTM, CEA, CA199, CHROMGRNA in the last 8760 hours.  Assessment and Plan:  Complained of dizziness and headache; right neck pain 12/30 Was seen in ED CT did reveal vascular malformation at cervicomedullar junction Now scheduled for cerebral arteriogram for evaluation of same Risks and Benefits  discussed with the patient including, but not limited to bleeding, infection, vascular injury, contrast induced renal failure, stroke or even death. All of the patient's questions were answered, patient is agreeable to proceed. Consent signed and in chart.  Thank you for this interesting consult.  I greatly enjoyed meeting Dillon Sweeney and look forward to participating in their care.  A copy of this report was sent to the requesting provider on this date.  Electronically Signed: Monia Sabal A 03/04/2016, 9:32 AM   I spent a total of  30 Minutes   in face to face in clinical consultation, greater than 50% of which was counseling/coordinating care for cerebral arteriogram

## 2016-03-04 NOTE — Sedation Documentation (Signed)
Patient is resting comfortably. 

## 2016-03-04 NOTE — Sedation Documentation (Addendum)
Right groin sheath removed. V-Pad applied

## 2016-03-04 NOTE — Sedation Documentation (Signed)
ETCO2 removed per Dr. Deveshwar 

## 2016-03-04 NOTE — Discharge Instructions (Signed)
Cerebral Angiogram, Care After °Refer to this sheet in the next few weeks. These instructions provide you with information on caring for yourself after your procedure. Your health care provider may also give you more specific instructions. Your treatment has been planned according to current medical practices, but problems sometimes occur. Call your health care provider if you have any problems or questions after your procedure. °What can I expect after the procedure? °After your procedure, it is typical to have the following: °· Bruising at the catheter insertion site that usually fades within 1-2 weeks. °· Blood collecting in the tissue (hematoma) that may be painful to the touch. It should usually decrease in size and tenderness within 1-2 weeks. °· A mild headache. ° °Follow these instructions at home: °· Take medicines only as directed by your health care provider. °· You may shower 24-48 hours after the procedure or as directed by your health care provider. Remove the bandage (dressing) and gently wash the site with plain soap and water. Pat the area dry with a clean towel. Do not rub the site, because this may cause bleeding. °· Do not take baths, swim, or use a hot tub until your health care provider approves. °· Check your insertion site every day for redness, swelling, or drainage. °· Do not apply powder or lotion to the site. °· Do not lift over 10 lb (4.5 kg) for 5 days after your procedure or as directed by your health care provider. °· Ask your health care provider when it is okay to: °? Return to work or school. °? Resume usual physical activities or sports. °? Resume sexual activity. °· Do not drive home if you are discharged the same day as the procedure. Have someone else drive you. °· You may drive 24 hours after the procedure unless otherwise instructed by your health care provider. °· Do not operate machinery or power tools for 24 hours after the procedure or as directed by your health care  provider. °· If your procedure was done as an outpatient procedure, which means that you went home the same day as your procedure, a responsible adult should be with you for the first 24 hours after you arrive home. °· Keep all follow-up visits as directed by your health care provider. This is important. °Contact a health care provider if: °· You have a fever. °· You have chills. °· You have increased bleeding from the catheter insertion site. Hold pressure on the site. °Get help right away if: °· You have vision changes or loss of vision. °· You have numbness or weakness on one side of your body. °· You have difficulty talking, or you have slurred speech or cannot speak (aphasia). °· You feel confused or have difficulty remembering. °· You have unusual pain at the catheter insertion site. °· You have redness, warmth, or swelling at the catheter insertion site. °· You have drainage (other than a small amount of blood on the dressing) from the catheter insertion site. °· The catheter insertion site is bleeding, and the bleeding does not stop after 30 minutes of holding steady pressure on the site. °These symptoms may represent a serious problem that is an emergency. Do not wait to see if the symptoms will go away. Get medical help right away. Call your local emergency services (911 in [place] Do not drive yourself to the hospital. °This information is not intended to replace advice given to you by your health care provider. Make sure you discuss any questions   you have with your health care provider. °Document Released: 06/03/2013 Document Revised: 06/25/2015 Document Reviewed: 01/30/2013 °Elsevier Interactive Patient Education © 2017 [employer] Inc[employer] ° °

## 2016-03-04 NOTE — Procedures (Signed)
S/P $ vessel cerebral arteriogram. RT CFA approach. Findings. 1.RT sided skull base DAVF supplied  primarily the RT PICA

## 2016-03-07 ENCOUNTER — Encounter (HOSPITAL_COMMUNITY): Payer: Self-pay | Admitting: Interventional Radiology

## 2016-03-17 NOTE — Progress Notes (Signed)
Subjective:    Patient ID: Dillon Sweeney, male    DOB: 1932-08-29, 83 y.o.   MRN: [id number]  HPI 83 year old gentleman who is followed for hypertension diabetes and hyperlipidemia. He was last scheduled to be seen on early February but had some sort of episode characterized by extreme dizziness. He was taken to the hospital and then finally underwent cerebral angiography. There are some abnormal findings but I'm not sure they explain the dizziness. He has not had any symptoms since. Blood pressure is well controlled. Last A1c was 6.2. Lipids were last assessed 11 months ago and were at goal.  Patient Active Problem List   Diagnosis Date Noted  . GERD (gastroesophageal reflux disease) 09/13/2013  . Atherosclerosis   . Cholelithiasis   . Bladder stones   . Hyperlipidemia   . Diabetes ([place]) 08/06/2012  . HTN (hypertension) 08/06/2012  . Unspecified vitamin D deficiency 08/06/2012  . Hypothyroidism 08/06/2012  . Tobacco use disorder 08/06/2012  . Abdominal aortic aneurysm 10/13/2011  . Myocardial infarction 09/22/2010   Outpatient Encounter Prescriptions as of 03/18/2016  Medication Sig  . acetaminophen (TYLENOL) 500 MG tablet Take 1,000 mg by mouth every 6 (six) hours as needed for mild pain or moderate pain.  Marland Kitchen amLODipine (NORVASC) 5 MG tablet Take 1 tablet (5 mg total) by mouth daily.  Marland Kitchen aspirin 81 MG tablet Take 81 mg by mouth daily.    . benazepril (LOTENSIN) 40 MG tablet Take 1 tablet (40 mg total) by mouth daily.  . Cholecalciferol (VITAMIN D) 2000 UNITS CAPS Take 1 capsule by mouth daily.   Marland Kitchen levothyroxine (SYNTHROID, LEVOTHROID) 150 MCG tablet Take 1 tablet (150 mcg total) by mouth daily.  . metFORMIN (GLUCOPHAGE) 500 MG tablet Take 1 tablet (500 mg total) by mouth daily with breakfast.  . metoprolol (LOPRESSOR) 100 MG tablet Take 1 tablet (100 mg total) by mouth daily.  . rosuvastatin (CRESTOR) 40 MG tablet TAKE ONE TABLET BY MOUTH EVERY DAY   No facility-administered  encounter medications on file as of 03/18/2016.       Review of Systems  Constitutional: Negative.   HENT: Negative.   Eyes: Negative.   Respiratory: Negative.  Negative for shortness of breath.   Cardiovascular: Negative.  Negative for chest pain and leg swelling.  Gastrointestinal: Negative.   Genitourinary: Negative.   Musculoskeletal: Negative.   Skin: Negative.   Neurological: Negative.   Psychiatric/Behavioral: Negative.   All other systems reviewed and are negative.      Objective:   Physical Exam  Constitutional: He is oriented to person, place, and time. He appears well-developed and well-nourished.  Cardiovascular: Normal rate, regular rhythm and normal heart sounds.   Pulmonary/Chest: Effort normal and breath sounds normal.  Neurological: He is alert and oriented to person, place, and time. No cranial nerve deficit. Coordination normal.  Psychiatric: He has a normal mood and affect. His behavior is normal.   BP 130/67   Pulse (!) 57   Temp 97.3 F (36.3 C) (Oral)   Ht '5\' 9"'$  (1.753 m)   Wt 191 lb 3.2 oz (86.7 kg)   BMI 28.24 kg/m         Assessment & Plan:  1. Essential hypertension Blood pressure is. Type 2 diabetes mellitus without complication, without long-term current use of insulin (HCCI do not believe he monitors his blood sugar but it has been well controlled. Will reassess A1c today  - Bayer DCA Hb A1c Waived  Wardell Honour MD

## 2016-03-18 ENCOUNTER — Encounter: Payer: Self-pay | Admitting: Family Medicine

## 2016-03-18 ENCOUNTER — Encounter (INDEPENDENT_AMBULATORY_CARE_PROVIDER_SITE_OTHER): Payer: Self-pay

## 2016-03-18 ENCOUNTER — Ambulatory Visit (INDEPENDENT_AMBULATORY_CARE_PROVIDER_SITE_OTHER): Payer: Medicare Other | Admitting: Family Medicine

## 2016-03-18 VITALS — BP 130/67 | HR 57 | Temp 97.3°F | Ht 69.0 in | Wt 191.2 lb

## 2016-03-18 DIAGNOSIS — E119 Type 2 diabetes mellitus without complications: Secondary | ICD-10-CM

## 2016-03-18 DIAGNOSIS — I10 Essential (primary) hypertension: Secondary | ICD-10-CM | POA: Diagnosis not present

## 2016-03-18 LAB — BAYER DCA HB A1C WAIVED: HB A1C: 6.2 % (ref <7.0)

## 2016-06-16 ENCOUNTER — Encounter: Payer: Self-pay | Admitting: Pediatrics

## 2016-06-16 ENCOUNTER — Ambulatory Visit (INDEPENDENT_AMBULATORY_CARE_PROVIDER_SITE_OTHER): Payer: Medicare Other | Admitting: Pediatrics

## 2016-06-16 VITALS — BP 139/73 | HR 54 | Temp 97.3°F | Resp 22 | Ht 69.0 in | Wt 190.6 lb

## 2016-06-16 DIAGNOSIS — E039 Hypothyroidism, unspecified: Secondary | ICD-10-CM

## 2016-06-16 DIAGNOSIS — E119 Type 2 diabetes mellitus without complications: Secondary | ICD-10-CM

## 2016-06-16 DIAGNOSIS — J439 Emphysema, unspecified: Secondary | ICD-10-CM

## 2016-06-16 DIAGNOSIS — I10 Essential (primary) hypertension: Secondary | ICD-10-CM

## 2016-06-16 LAB — BAYER DCA HB A1C WAIVED: HB A1C (BAYER DCA - WAIVED): 6.2 % (ref <7.0)

## 2016-06-16 NOTE — Progress Notes (Signed)
  Subjective:   Patient ID: Dillon Sweeney, male    DOB: [birth year], 83 y.o.   MRN: [id number] CC: Follow-up (3 month, Previous [facility] patient); Shortness of Breath (on exertion); and Leg Pain (intermittent)  HPI: Dillon Sweeney is a 83 y.o. male presenting for Follow-upShortness of Breath (on exertion)  HTN: no CP, no dizziness, no weakness or falls  DM2: drinks sweet tea some but otherwise avoiding all sugary foods/beverages  Hypothyroidism: taking med daily  H/o emphysema Noticing more SOB with activities Walking to the mail box gets out of breath, improves after resting Smoking a few cigarettes most days No chest tightness or pain with exertion  Relevant past medical, surgical, family and social history reviewed. Allergies and medications reviewed and updated. History  Smoking Status  . Current Some Day Smoker  . Packs/day: 1.00  . Types: Cigarettes  . Last attempt to quit: 09/10/2010  Smokeless Tobacco  . Never Used   ROS: Per HPI   Objective:    BP 139/73   Pulse (!) 54   Temp 97.3 F (36.3 C) (Oral)   Resp (!) 22   Ht _0  (1.753 m)   Wt 190 lb 9.6 oz (86.5 kg)   SpO2 95%   BMI 28.15 kg/m   Wt Readings from Last 3 Encounters:  06/16/16 190 lb 9.6 oz (86.5 kg)  03/18/16 191 lb 3.2 oz (86.7 kg)  03/04/16 186 lb (84.4 kg)    Gen: NAD, alert, cooperative with exam, NCAT EYES: EOMI, no conjunctival injection, or no icterus CV: NRRR, normal S1/S2, no murmur, distal pulses 2+ b/l Resp: CTABL, no wheezes, normal WOB Abd: +BS, soft, NTND. no guarding or organomegaly Ext: No edema, warm Neuro: Alert and oriented, strength equal b/l UE and LE, sensation intact b/l, normal gait  Assessment & Plan:  Dillon Sweeney was seen today for follow-up, shortness of breath and leg pain.  Diagnoses and all orders for this visit:  Type 2 diabetes mellitus without complication, without long-term current use of insulin (HCC) Well controlled A1c 6.2 Cont metformin -     Bayer  DCA Hb A1c Waived  Essential hypertension Adequate control, cont current meds -     BMP8+EGFR  Hypothyroidism, unspecified type Stable, check TSH today, cont current meds -     TSH  Pulmonary emphysema, unspecified emphysema type (HCC) Walking o2 sats remain 95% with walking around clinic Cont to encourage cessation of smoking  Follow up plan: Return in about 3 months (around 09/16/2016). Assunta Found, MD [place]

## 2016-06-17 LAB — BMP8+EGFR
BUN / CREAT RATIO: 10 (ref 10–24)
BUN: 10 mg/dL (ref 8–27)
CALCIUM: 9.5 mg/dL (ref 8.6–10.2)
CHLORIDE: 101 mmol/L (ref 96–106)
CO2: 23 mmol/L (ref 18–29)
Creatinine, Ser: 0.99 mg/dL (ref 0.76–1.27)
GFR calc non Af Amer: 71 mL/min/{1.73_m2} (ref >59)
GFR, EST AFRICAN AMERICAN: 82 mL/min/{1.73_m2} (ref >59)
Glucose: 92 mg/dL (ref 65–99)
POTASSIUM: 4.4 mmol/L (ref 3.5–5.2)
Sodium: 138 mmol/L (ref 134–144)

## 2016-06-17 LAB — TSH: TSH: 0.647 u[IU]/mL (ref 0.450–4.500)

## 2016-09-07 ENCOUNTER — Other Ambulatory Visit (HOSPITAL_COMMUNITY): Payer: Self-pay | Admitting: Interventional Radiology

## 2016-09-07 DIAGNOSIS — I771 Stricture of artery: Secondary | ICD-10-CM

## 2016-09-16 ENCOUNTER — Encounter: Payer: Self-pay | Admitting: Pediatrics

## 2016-09-16 ENCOUNTER — Ambulatory Visit (INDEPENDENT_AMBULATORY_CARE_PROVIDER_SITE_OTHER): Payer: Medicare Other | Admitting: Pediatrics

## 2016-09-16 VITALS — BP 133/67 | HR 59 | Temp 97.5°F | Ht 69.0 in | Wt 185.0 lb

## 2016-09-16 DIAGNOSIS — E039 Hypothyroidism, unspecified: Secondary | ICD-10-CM

## 2016-09-16 DIAGNOSIS — I10 Essential (primary) hypertension: Secondary | ICD-10-CM | POA: Diagnosis not present

## 2016-09-16 DIAGNOSIS — E785 Hyperlipidemia, unspecified: Secondary | ICD-10-CM | POA: Diagnosis not present

## 2016-09-16 DIAGNOSIS — E119 Type 2 diabetes mellitus without complications: Secondary | ICD-10-CM | POA: Diagnosis not present

## 2016-09-16 LAB — BAYER DCA HB A1C WAIVED: HB A1C (BAYER DCA - WAIVED): 5.9 % (ref <7.0)

## 2016-09-16 MED ORDER — BENAZEPRIL HCL 40 MG PO TABS: 40.0000 mg | ORAL_TABLET | Freq: Every day | ORAL | 1 refills | Status: DC

## 2016-09-16 MED ORDER — ROSUVASTATIN CALCIUM 40 MG PO TABS: ORAL_TABLET | ORAL | 1 refills | Status: DC

## 2016-09-16 MED ORDER — METOPROLOL TARTRATE 100 MG PO TABS: 100.0000 mg | ORAL_TABLET | Freq: Every day | ORAL | 1 refills | Status: DC

## 2016-09-16 MED ORDER — LEVOTHYROXINE SODIUM 150 MCG PO TABS: 150.0000 ug | ORAL_TABLET | Freq: Every day | ORAL | 1 refills | Status: DC

## 2016-09-16 MED ORDER — METFORMIN HCL 500 MG PO TABS: 500.0000 mg | ORAL_TABLET | Freq: Every day | ORAL | 1 refills | Status: DC

## 2016-09-16 MED ORDER — AMLODIPINE BESYLATE 5 MG PO TABS: 5.0000 mg | ORAL_TABLET | Freq: Every day | ORAL | 1 refills | Status: DC

## 2016-09-16 NOTE — Progress Notes (Signed)
  Subjective:   Patient ID: Dillon Sweeney, male    DOB: [birth year], 83 y.o.   MRN: [id number] CC: Follow-up (3 month) multiple med problems HPI: Dillon Sweeney is a 83 y.o. male presenting for Follow-up (3 month)  DM2: BGLs 101-103 Taking metformin Watching what he is eating  Was rec by neurosurgery to have MRI done in follow up Pt on fixed income, says he talked with neurosurgeon, who said it was a follow up scan, OK to not do for now if feeling well  HTN: taking meds regularly No CP, no HA  Energy levels have bene good Trying to watch what he is eating, has lost abou tfive lbs  Relevant past medical, surgical, family and social history reviewed. Allergies and medications reviewed and updated. History  Smoking Status  . Current Some Day Smoker  . Packs/day: 1.00  . Types: Cigarettes  . Last attempt to quit: 09/10/2010  Smokeless Tobacco  . Never Used   ROS: Per HPI   Objective:    BP 133/67   Pulse (!) 59   Temp (!) 97.5 F (36.4 C) (Oral)   Ht 5\' 9"  (1.753 m)   Wt 185 lb (83.9 kg)   BMI 27.32 kg/m   Wt Readings from Last 3 Encounters:  09/16/16 185 lb (83.9 kg)  06/16/16 190 lb 9.6 oz (86.5 kg)  03/18/16 191 lb 3.2 oz (86.7 kg)    Gen: NAD, alert, cooperative with exam, NCAT EYES: EOMI, no conjunctival injection, or no icterus CV: NRRR, normal S1/S2, no murmur, distal pulses 2+ b/l Resp: CTABL, no wheezes, normal WOB Abd: +BS, soft, NTND. no guarding or organomegaly Ext: No edema, warm Neuro: Alert and oriented, strength equal b/l UE and LE, coordination grossly normal MSK: normal muscle bulk  Assessment & Plan:  Nirav was seen today for follow-up med problems.  Diagnoses and all orders for this visit:  Type 2 diabetes mellitus without complication, without long-term current use of insulin (HCC) A1c 5.9, well controlled Discussed stopping metformin Pt wants to cont for now -     Bayer DCA Hb A1c Waived -     metFORMIN (GLUCOPHAGE) 500 MG  tablet; Take 1 tablet (500 mg total) by mouth daily with breakfast.  Essential hypertension Adequate control, cont current meds -     amLODipine (NORVASC) 5 MG tablet; Take 1 tablet (5 mg total) by mouth daily. -     benazepril (LOTENSIN) 40 MG tablet; Take 1 tablet (40 mg total) by mouth daily. -     metoprolol tartrate (LOPRESSOR) 100 MG tablet; Take 1 tablet (100 mg total) by mouth daily.  Hypothyroidism, unspecified type Stable, cont below -     levothyroxine (SYNTHROID, LEVOTHROID) 150 MCG tablet; Take 1 tablet (150 mcg total) by mouth daily.  Hyperlipidemia, unspecified hyperlipidemia type Stable, cont below -     rosuvastatin (CRESTOR) 40 MG tablet; TAKE ONE TABLET BY MOUTH EVERY DAY   Follow up plan: Return in about 6 months (around 03/19/2017) for follow up. Assunta Found, MD [place]

## 2016-09-16 NOTE — Addendum Note (Signed)
Addended by: Wardell Heath on: 09/16/2016 08:46 AM   Modules accepted: Orders

## 2016-09-17 LAB — MICROALBUMIN / CREATININE URINE RATIO
CREATININE, UR: 88.1 mg/dL
MICROALB/CREAT RATIO: 37.7 mg/g{creat} — AB (ref 0.0–30.0)
Microalbumin, Urine: 33.2 ug/mL

## 2016-09-20 ENCOUNTER — Ambulatory Visit (HOSPITAL_COMMUNITY): Payer: Medicare Other

## 2017-02-08 ENCOUNTER — Telehealth: Payer: Self-pay

## 2017-02-08 ENCOUNTER — Other Ambulatory Visit: Payer: Self-pay

## 2017-02-08 DIAGNOSIS — R079 Chest pain, unspecified: Secondary | ICD-10-CM

## 2017-02-08 NOTE — Telephone Encounter (Signed)
Can you schedule appt for pt to see me?

## 2017-02-08 NOTE — Telephone Encounter (Signed)
Appt made

## 2017-02-08 NOTE — Telephone Encounter (Signed)
EMS came to patients house because of left arm pain and put the monitor on him  Do not think he was having a heart attack but patient refused to be transported  EMS  Suggested cardiology referral  Patient has not seen a cardiologist in 7 years and has a cardiac HX   Referral placed to [facility] in [place]

## 2017-02-10 ENCOUNTER — Ambulatory Visit: Payer: Medicare Other | Admitting: Pediatrics

## 2017-02-10 ENCOUNTER — Inpatient Hospital Stay (HOSPITAL_COMMUNITY)
Admission: EM | Admit: 2017-02-10 | Discharge: 2017-02-21 | DRG: 280 | Disposition: A | Discharge status: Hospice | Payer: Medicare Other | Attending: Internal Medicine | Admitting: Internal Medicine

## 2017-02-10 ENCOUNTER — Other Ambulatory Visit: Payer: Self-pay

## 2017-02-10 ENCOUNTER — Emergency Department (HOSPITAL_COMMUNITY): Payer: Medicare Other

## 2017-02-10 ENCOUNTER — Encounter: Payer: Self-pay | Admitting: Pediatrics

## 2017-02-10 ENCOUNTER — Encounter (HOSPITAL_COMMUNITY): Payer: Self-pay | Admitting: Emergency Medicine

## 2017-02-10 ENCOUNTER — Ambulatory Visit (INDEPENDENT_AMBULATORY_CARE_PROVIDER_SITE_OTHER): Payer: Medicare Other

## 2017-02-10 VITALS — BP 115/63 | HR 109 | Temp 100.6°F | Resp 22 | Ht 69.0 in | Wt 180.2 lb

## 2017-02-10 DIAGNOSIS — I21A1 Myocardial infarction type 2: Secondary | ICD-10-CM | POA: Diagnosis present

## 2017-02-10 DIAGNOSIS — R4189 Other symptoms and signs involving cognitive functions and awareness: Secondary | ICD-10-CM | POA: Diagnosis present

## 2017-02-10 DIAGNOSIS — E876 Hypokalemia: Secondary | ICD-10-CM | POA: Diagnosis not present

## 2017-02-10 DIAGNOSIS — R410 Disorientation, unspecified: Secondary | ICD-10-CM | POA: Diagnosis not present

## 2017-02-10 DIAGNOSIS — I5023 Acute on chronic systolic (congestive) heart failure: Secondary | ICD-10-CM | POA: Diagnosis not present

## 2017-02-10 DIAGNOSIS — R911 Solitary pulmonary nodule: Secondary | ICD-10-CM

## 2017-02-10 DIAGNOSIS — I10 Essential (primary) hypertension: Secondary | ICD-10-CM | POA: Diagnosis not present

## 2017-02-10 DIAGNOSIS — C3490 Malignant neoplasm of unspecified part of unspecified bronchus or lung: Secondary | ICD-10-CM | POA: Diagnosis present

## 2017-02-10 DIAGNOSIS — Z951 Presence of aortocoronary bypass graft: Secondary | ICD-10-CM

## 2017-02-10 DIAGNOSIS — R7989 Other specified abnormal findings of blood chemistry: Secondary | ICD-10-CM

## 2017-02-10 DIAGNOSIS — I5021 Acute systolic (congestive) heart failure: Secondary | ICD-10-CM | POA: Diagnosis not present

## 2017-02-10 DIAGNOSIS — E119 Type 2 diabetes mellitus without complications: Secondary | ICD-10-CM | POA: Diagnosis not present

## 2017-02-10 DIAGNOSIS — Z6824 Body mass index (BMI) 24.0-24.9, adult: Secondary | ICD-10-CM | POA: Diagnosis not present

## 2017-02-10 DIAGNOSIS — I252 Old myocardial infarction: Secondary | ICD-10-CM | POA: Diagnosis not present

## 2017-02-10 DIAGNOSIS — I480 Paroxysmal atrial fibrillation: Secondary | ICD-10-CM | POA: Diagnosis present

## 2017-02-10 DIAGNOSIS — I4510 Unspecified right bundle-branch block: Secondary | ICD-10-CM | POA: Diagnosis present

## 2017-02-10 DIAGNOSIS — R1013 Epigastric pain: Secondary | ICD-10-CM

## 2017-02-10 DIAGNOSIS — J690 Pneumonitis due to inhalation of food and vomit: Secondary | ICD-10-CM | POA: Diagnosis not present

## 2017-02-10 DIAGNOSIS — I509 Heart failure, unspecified: Secondary | ICD-10-CM

## 2017-02-10 DIAGNOSIS — A419 Sepsis, unspecified organism: Secondary | ICD-10-CM | POA: Diagnosis not present

## 2017-02-10 DIAGNOSIS — Z7982 Long term (current) use of aspirin: Secondary | ICD-10-CM

## 2017-02-10 DIAGNOSIS — Z66 Do not resuscitate: Secondary | ICD-10-CM | POA: Diagnosis present

## 2017-02-10 DIAGNOSIS — J439 Emphysema, unspecified: Secondary | ICD-10-CM | POA: Diagnosis not present

## 2017-02-10 DIAGNOSIS — J9621 Acute and chronic respiratory failure with hypoxia: Secondary | ICD-10-CM | POA: Diagnosis present

## 2017-02-10 DIAGNOSIS — R079 Chest pain, unspecified: Secondary | ICD-10-CM

## 2017-02-10 DIAGNOSIS — R509 Fever, unspecified: Secondary | ICD-10-CM | POA: Diagnosis not present

## 2017-02-10 DIAGNOSIS — F17200 Nicotine dependence, unspecified, uncomplicated: Secondary | ICD-10-CM | POA: Diagnosis present

## 2017-02-10 DIAGNOSIS — D62 Acute posthemorrhagic anemia: Secondary | ICD-10-CM | POA: Diagnosis not present

## 2017-02-10 DIAGNOSIS — R6510 Systemic inflammatory response syndrome (SIRS) of non-infectious origin without acute organ dysfunction: Secondary | ICD-10-CM | POA: Diagnosis present

## 2017-02-10 DIAGNOSIS — R0602 Shortness of breath: Secondary | ICD-10-CM

## 2017-02-10 DIAGNOSIS — J479 Bronchiectasis, uncomplicated: Secondary | ICD-10-CM | POA: Diagnosis present

## 2017-02-10 DIAGNOSIS — R0600 Dyspnea, unspecified: Secondary | ICD-10-CM

## 2017-02-10 DIAGNOSIS — I2510 Atherosclerotic heart disease of native coronary artery without angina pectoris: Secondary | ICD-10-CM | POA: Diagnosis not present

## 2017-02-10 DIAGNOSIS — Z87891 Personal history of nicotine dependence: Secondary | ICD-10-CM | POA: Diagnosis not present

## 2017-02-10 DIAGNOSIS — R778 Other specified abnormalities of plasma proteins: Secondary | ICD-10-CM

## 2017-02-10 DIAGNOSIS — E785 Hyperlipidemia, unspecified: Secondary | ICD-10-CM | POA: Diagnosis present

## 2017-02-10 DIAGNOSIS — I714 Abdominal aortic aneurysm, without rupture, unspecified: Secondary | ICD-10-CM | POA: Diagnosis present

## 2017-02-10 DIAGNOSIS — J9811 Atelectasis: Secondary | ICD-10-CM | POA: Diagnosis present

## 2017-02-10 DIAGNOSIS — Z79899 Other long term (current) drug therapy: Secondary | ICD-10-CM

## 2017-02-10 DIAGNOSIS — J810 Acute pulmonary edema: Secondary | ICD-10-CM | POA: Diagnosis not present

## 2017-02-10 DIAGNOSIS — E43 Unspecified severe protein-calorie malnutrition: Secondary | ICD-10-CM

## 2017-02-10 DIAGNOSIS — D509 Iron deficiency anemia, unspecified: Secondary | ICD-10-CM | POA: Diagnosis present

## 2017-02-10 DIAGNOSIS — I200 Unstable angina: Secondary | ICD-10-CM | POA: Diagnosis not present

## 2017-02-10 DIAGNOSIS — E872 Acidosis: Secondary | ICD-10-CM | POA: Diagnosis present

## 2017-02-10 DIAGNOSIS — D696 Thrombocytopenia, unspecified: Secondary | ICD-10-CM

## 2017-02-10 DIAGNOSIS — T502X5A Adverse effect of carbonic-anhydrase inhibitors, benzothiadiazides and other diuretics, initial encounter: Secondary | ICD-10-CM | POA: Diagnosis not present

## 2017-02-10 DIAGNOSIS — I25700 Atherosclerosis of coronary artery bypass graft(s), unspecified, with unstable angina pectoris: Secondary | ICD-10-CM | POA: Diagnosis not present

## 2017-02-10 DIAGNOSIS — R0789 Other chest pain: Secondary | ICD-10-CM | POA: Diagnosis not present

## 2017-02-10 DIAGNOSIS — E039 Hypothyroidism, unspecified: Secondary | ICD-10-CM | POA: Diagnosis present

## 2017-02-10 DIAGNOSIS — I361 Nonrheumatic tricuspid (valve) insufficiency: Secondary | ICD-10-CM | POA: Diagnosis not present

## 2017-02-10 DIAGNOSIS — D649 Anemia, unspecified: Secondary | ICD-10-CM | POA: Diagnosis not present

## 2017-02-10 DIAGNOSIS — I4891 Unspecified atrial fibrillation: Secondary | ICD-10-CM | POA: Diagnosis not present

## 2017-02-10 DIAGNOSIS — Z515 Encounter for palliative care: Secondary | ICD-10-CM | POA: Diagnosis present

## 2017-02-10 DIAGNOSIS — J9601 Acute respiratory failure with hypoxia: Secondary | ICD-10-CM | POA: Diagnosis not present

## 2017-02-10 DIAGNOSIS — I110 Hypertensive heart disease with heart failure: Secondary | ICD-10-CM | POA: Diagnosis present

## 2017-02-10 DIAGNOSIS — J432 Centrilobular emphysema: Secondary | ICD-10-CM | POA: Diagnosis present

## 2017-02-10 DIAGNOSIS — N179 Acute kidney failure, unspecified: Secondary | ICD-10-CM | POA: Diagnosis present

## 2017-02-10 DIAGNOSIS — E7849 Other hyperlipidemia: Secondary | ICD-10-CM | POA: Diagnosis not present

## 2017-02-10 DIAGNOSIS — Z9889 Other specified postprocedural states: Secondary | ICD-10-CM | POA: Diagnosis not present

## 2017-02-10 DIAGNOSIS — R062 Wheezing: Secondary | ICD-10-CM | POA: Diagnosis not present

## 2017-02-10 DIAGNOSIS — R748 Abnormal levels of other serum enzymes: Secondary | ICD-10-CM | POA: Diagnosis not present

## 2017-02-10 DIAGNOSIS — I219 Acute myocardial infarction, unspecified: Secondary | ICD-10-CM | POA: Diagnosis present

## 2017-02-10 DIAGNOSIS — N200 Calculus of kidney: Secondary | ICD-10-CM

## 2017-02-10 HISTORY — DX: Emphysema, unspecified: J43.9

## 2017-02-10 LAB — TYPE AND SCREEN
ABO/RH(D): A POS
ANTIBODY SCREEN: NEGATIVE

## 2017-02-10 LAB — LACTIC ACID, PLASMA
LACTIC ACID, VENOUS: 2.3 mmol/L — AB (ref 0.5–1.9)
Lactic Acid, Venous: 1.9 mmol/L (ref 0.5–1.9)

## 2017-02-10 LAB — CBC WITH DIFFERENTIAL/PLATELET
BAND NEUTROPHILS: 3 %
BASOS PCT: 0 %
Basophils Absolute: 0 10*3/uL (ref 0.0–0.1)
Blasts: 0 %
EOS PCT: 0 %
Eosinophils Absolute: 0 10*3/uL (ref 0.0–0.7)
HCT: 25.7 % — ABNORMAL LOW (ref 39.0–52.0)
Hemoglobin: 8.4 g/dL — ABNORMAL LOW (ref 13.0–17.0)
LYMPHS ABS: 0.9 10*3/uL (ref 0.7–4.0)
LYMPHS PCT: 6 %
MCH: 31.8 pg (ref 26.0–34.0)
MCHC: 32.7 g/dL (ref 30.0–36.0)
MCV: 97.3 fL (ref 78.0–100.0)
MONO ABS: 0.7 10*3/uL (ref 0.1–1.0)
Metamyelocytes Relative: 2 %
Monocytes Relative: 5 %
Myelocytes: 1 %
NEUTROS ABS: 12.9 10*3/uL — AB (ref 1.7–7.7)
NEUTROS PCT: 83 %
NRBC: 0 /100{WBCs}
OTHER: 0 %
PLATELETS: 72 10*3/uL — AB (ref 150–400)
Promyelocytes Absolute: 0 %
RBC: 2.64 MIL/uL — ABNORMAL LOW (ref 4.22–5.81)
RDW: 23.4 % — AB (ref 11.5–15.5)
WBC: 14.5 10*3/uL — ABNORMAL HIGH (ref 4.0–10.5)

## 2017-02-10 LAB — URINALYSIS, ROUTINE W REFLEX MICROSCOPIC
BILIRUBIN URINE: NEGATIVE
GLUCOSE, UA: NEGATIVE mg/dL
Ketones, ur: NEGATIVE mg/dL
LEUKOCYTES UA: NEGATIVE
NITRITE: NEGATIVE
Protein, ur: 100 mg/dL — AB
SPECIFIC GRAVITY, URINE: 1.034 — AB (ref 1.005–1.030)
pH: 5 (ref 5.0–8.0)

## 2017-02-10 LAB — I-STAT TROPONIN, ED
Troponin i, poc: 0.7 ng/mL (ref 0.00–0.08)
Troponin i, poc: 0.95 ng/mL (ref 0.00–0.08)

## 2017-02-10 LAB — POC OCCULT BLOOD, ED: Fecal Occult Bld: NEGATIVE

## 2017-02-10 LAB — BASIC METABOLIC PANEL
Anion gap: 13 (ref 5–15)
BUN: 20 mg/dL (ref 6–20)
CALCIUM: 9.2 mg/dL (ref 8.9–10.3)
CO2: 20 mmol/L — AB (ref 22–32)
CREATININE: 1.24 mg/dL (ref 0.61–1.24)
Chloride: 97 mmol/L — ABNORMAL LOW (ref 101–111)
GFR calc Af Amer: >60 mL/min (ref >60)
GFR calc non Af Amer: 52 mL/min — ABNORMAL LOW (ref >60)
Glucose, Bld: 162 mg/dL — ABNORMAL HIGH (ref 65–99)
Potassium: 4 mmol/L (ref 3.5–5.1)
SODIUM: 130 mmol/L — AB (ref 135–145)

## 2017-02-10 LAB — CBG MONITORING, ED
Glucose-Capillary: 124 mg/dL — ABNORMAL HIGH (ref 65–99)
Glucose-Capillary: 114 mg/dL — ABNORMAL HIGH (ref 65–99)

## 2017-02-10 LAB — I-STAT CG4 LACTIC ACID, ED: LACTIC ACID, VENOUS: 2.74 mmol/L — AB (ref 0.5–1.9)

## 2017-02-10 LAB — TROPONIN I: TROPONIN I: 1.31 ng/mL — AB (ref <0.03)

## 2017-02-10 MED ORDER — INSULIN ASPART 100 UNIT/ML ~~LOC~~ SOLN
0.0000 [IU] | Freq: Three times a day (TID) | SUBCUTANEOUS | Status: DC
  Administered 2017-02-11: 2 [IU] via SUBCUTANEOUS
  Administered 2017-02-11: 1 [IU] via SUBCUTANEOUS
  Administered 2017-02-12 – 2017-02-13 (×2): 2 [IU] via SUBCUTANEOUS
  Administered 2017-02-13: 1 [IU] via SUBCUTANEOUS
  Administered 2017-02-13 – 2017-02-14 (×2): 2 [IU] via SUBCUTANEOUS
  Administered 2017-02-14 – 2017-02-15 (×2): 1 [IU] via SUBCUTANEOUS
  Administered 2017-02-15: 3 [IU] via SUBCUTANEOUS
  Administered 2017-02-15: 1 [IU] via SUBCUTANEOUS
  Administered 2017-02-16: 2 [IU] via SUBCUTANEOUS
  Administered 2017-02-16: 3 [IU] via SUBCUTANEOUS
  Administered 2017-02-16: 2 [IU] via SUBCUTANEOUS
  Administered 2017-02-17: 1 [IU] via SUBCUTANEOUS
  Administered 2017-02-17: 3 [IU] via SUBCUTANEOUS
  Administered 2017-02-18: 2 [IU] via SUBCUTANEOUS
  Administered 2017-02-18: 1 [IU] via SUBCUTANEOUS
  Administered 2017-02-18: 5 [IU] via SUBCUTANEOUS
  Administered 2017-02-19: 2 [IU] via SUBCUTANEOUS
  Administered 2017-02-19: 3 [IU] via SUBCUTANEOUS
  Administered 2017-02-19: 1 [IU] via SUBCUTANEOUS
  Administered 2017-02-20 (×2): 2 [IU] via SUBCUTANEOUS
  Filled 2017-02-10 (×3): qty 1

## 2017-02-10 MED ORDER — SODIUM CHLORIDE 0.9 % IV BOLUS (SEPSIS)
500.0000 mL | Freq: Once | INTRAVENOUS | Status: AC
Start: 2017-02-10 — End: 2017-02-10
  Administered 2017-02-10: 500 mL via INTRAVENOUS

## 2017-02-10 MED ORDER — METOPROLOL TARTRATE 12.5 MG HALF TABLET
12.5000 mg | ORAL_TABLET | Freq: Two times a day (BID) | ORAL | Status: DC
  Administered 2017-02-10 – 2017-02-12 (×3): 12.5 mg via ORAL
  Filled 2017-02-10 (×3): qty 1

## 2017-02-10 MED ORDER — SODIUM CHLORIDE 0.9 % IV BOLUS (SEPSIS)
1000.0000 mL | Freq: Once | INTRAVENOUS | Status: AC
  Administered 2017-02-10: 1000 mL via INTRAVENOUS

## 2017-02-10 MED ORDER — SODIUM CHLORIDE 0.9 % IV SOLN
INTRAVENOUS | Status: DC
  Administered 2017-02-10: 13:00:00 via INTRAVENOUS

## 2017-02-10 MED ORDER — ROSUVASTATIN CALCIUM 20 MG PO TABS
40.0000 mg | ORAL_TABLET | Freq: Every day | ORAL | Status: DC
  Administered 2017-02-10 – 2017-02-19 (×9): 40 mg via ORAL
  Filled 2017-02-10 (×11): qty 2

## 2017-02-10 MED ORDER — ACETAMINOPHEN 500 MG PO TABS
1000.0000 mg | ORAL_TABLET | Freq: Once | ORAL | Status: AC
  Administered 2017-02-10: 1000 mg via ORAL
  Filled 2017-02-10: qty 2

## 2017-02-10 MED ORDER — HYDROCODONE-ACETAMINOPHEN 5-325 MG PO TABS
1.0000 | ORAL_TABLET | Freq: Four times a day (QID) | ORAL | Status: DC | PRN
  Administered 2017-02-10 – 2017-02-11 (×2): 1 via ORAL
  Filled 2017-02-10 (×2): qty 1

## 2017-02-10 MED ORDER — VANCOMYCIN HCL IN DEXTROSE 750-5 MG/150ML-% IV SOLN
750.0000 mg | Freq: Two times a day (BID) | INTRAVENOUS | Status: DC
  Administered 2017-02-11: 750 mg via INTRAVENOUS
  Filled 2017-02-10 (×5): qty 150

## 2017-02-10 MED ORDER — VANCOMYCIN HCL 10 G IV SOLR
1500.0000 mg | Freq: Once | INTRAVENOUS | Status: AC
  Administered 2017-02-10: 1500 mg via INTRAVENOUS
  Filled 2017-02-10 (×2): qty 1500

## 2017-02-10 MED ORDER — FENTANYL CITRATE (PF) 100 MCG/2ML IJ SOLN: 50.0000 ug | INTRAMUSCULAR | Status: DC | PRN

## 2017-02-10 MED ORDER — PIPERACILLIN-TAZOBACTAM 3.375 G IVPB
3.3750 g | Freq: Three times a day (TID) | INTRAVENOUS | Status: DC
  Administered 2017-02-10 – 2017-02-11 (×3): 3.375 g via INTRAVENOUS
  Filled 2017-02-10 (×3): qty 50

## 2017-02-10 MED ORDER — PIPERACILLIN-TAZOBACTAM 3.375 G IVPB 30 MIN
3.3750 g | Freq: Once | INTRAVENOUS | Status: AC
  Administered 2017-02-10: 3.375 g via INTRAVENOUS
  Filled 2017-02-10: qty 50

## 2017-02-10 MED ORDER — ASPIRIN 81 MG PO CHEW: 324.0000 mg | CHEWABLE_TABLET | Freq: Once | ORAL | Status: DC

## 2017-02-10 MED ORDER — DEXTROSE 5 % IV SOLN: 1.0000 g | Freq: Once | INTRAVENOUS | Status: DC

## 2017-02-10 MED ORDER — PANTOPRAZOLE SODIUM 40 MG IV SOLR
40.0000 mg | Freq: Two times a day (BID) | INTRAVENOUS | Status: DC
  Administered 2017-02-10 – 2017-02-12 (×5): 40 mg via INTRAVENOUS
  Filled 2017-02-10 (×5): qty 40

## 2017-02-10 MED ORDER — ASPIRIN 325 MG PO TABS
325.0000 mg | ORAL_TABLET | Freq: Once | ORAL | Status: AC
  Administered 2017-02-10: 325 mg via ORAL
  Filled 2017-02-10: qty 1

## 2017-02-10 MED ORDER — IOPAMIDOL (ISOVUE-370) INJECTION 76%
100.0000 mL | Freq: Once | INTRAVENOUS | Status: AC | PRN
  Administered 2017-02-10: 100 mL via INTRAVENOUS

## 2017-02-10 NOTE — ED Notes (Signed)
CRITICAL VALUE ALERT  Critical Value:  Lactic Acid 2.3  Date & Time Notied:  02/10/17 1632  Provider Notified: Dr. Denton Brick  Orders Received/Actions taken: MD notified, order given for 500cc NS bolus

## 2017-02-10 NOTE — ED Notes (Signed)
CRITICAL VALUE ALERT  Critical Value:  Istat Lactic Acid 2.74  Date & Time Notied:  02/10/17 1317  Provider Notified: Dr. Reather Converse  Orders Received/Actions taken: EDP notified, no further orders at this time

## 2017-02-10 NOTE — ED Notes (Signed)
T/c with hospitalist, notified that per bed placement, pt will be holding here unless he needs cath tonight, Lopressor and aspirin recently ordered, will continue to monitor TPN and pt chest pain

## 2017-02-10 NOTE — ED Notes (Signed)
CRITICAL VALUE ALERT  Critical Value:  Istat Troponin 0.70  Date & Time Notied:  02/10/17 1255  Provider Notified: Dr. Reather Converse  Orders Received/Actions taken: EDP notified, no further orders given at this time

## 2017-02-10 NOTE — ED Notes (Signed)
Pt refuses pain medication at this time. 

## 2017-02-10 NOTE — ED Provider Notes (Signed)
[facility] EMERGENCY DEPARTMENT Provider Note   CSN: [id number] Arrival date & time: 02/10/17  1202     History   Chief Complaint Chief Complaint  Patient presents with  . Chest Pain  . Shortness of Breath    HPI Dillon Sweeney is a 84 y.o. male.  Patient presents with chest pressure and shortness of breath for the past 3 days. Initially more severe and now mild. Discomfort lower chest and upper abdomen. No radiation. She denies active bleeding. Patient has had multiple different histories including for aneurysm, CABG. Patient says this is different than previous. Unsure if exertional as he's been too weak recently. Patient denies fever however has low-grade fever on arrival.      Past Medical History:  Diagnosis Date  . AAA (abdominal aortic aneurysm) ([place])   . Atherosclerosis   . Bladder stones   . CAD (coronary artery disease)   . Cholelithiasis   . Chronic constipation   . Diabetes mellitus    Type 2  . Dyslipidemia   . Hyperlipidemia   . Hypertension   . Hypothyroidism   . Myocardial infarction ([place]) 09/22/10   1986  . Pneumonia 2012    Patient Active Problem List   Diagnosis Date Noted  . Chest pain 02/10/2017  . GERD (gastroesophageal reflux disease) 09/13/2013  . Atherosclerosis   . Cholelithiasis   . Bladder stones   . Hyperlipidemia   . Diabetes ([place]) 08/06/2012  . HTN (hypertension) 08/06/2012  . Unspecified vitamin D deficiency 08/06/2012  . Hypothyroidism 08/06/2012  . Tobacco use disorder 08/06/2012  . Abdominal aortic aneurysm 10/13/2011  . Myocardial infarction 09/22/2010    Past Surgical History:  Procedure Laterality Date  . ABDOMINAL AORTIC ANEURYSM REPAIR  09/15/10   Juxtarenal AAA repair   . APPENDECTOMY    . CORONARY ARTERY BYPASS GRAFT  1986   left leg vein harvest  . IR GENERIC HISTORICAL  03/04/2016   IR ANGIO VERTEBRAL SEL VERTEBRAL BILAT MOD SED 03/04/2016 Luanne Bras, MD [facility]  . IR GENERIC HISTORICAL   03/04/2016   IR ANGIO INTRA EXTRACRAN SEL COM CAROTID INNOMINATE BILAT MOD SED 03/04/2016 Luanne Bras, MD [facility]  . TONSILLECTOMY         Home Medications    Prior to Admission medications   Medication Sig Start Date End Date Taking? Authorizing Provider  acetaminophen (TYLENOL) 500 MG tablet Take 1,000 mg by mouth every 6 (six) hours as needed for mild pain or moderate pain.   Yes [provider]  amLODipine (NORVASC) 5 MG tablet Take 1 tablet (5 mg total) by mouth daily. 09/16/16  Yes Eustaquio Maize, MD  aspirin 81 MG tablet Take 81 mg by mouth daily.     Yes [provider]  benazepril (LOTENSIN) 40 MG tablet Take 1 tablet (40 mg total) by mouth daily. 09/16/16  Yes Eustaquio Maize, MD  Cholecalciferol (VITAMIN D) 2000 UNITS CAPS Take 1 capsule by mouth daily.    Yes [provider]  levothyroxine (SYNTHROID, LEVOTHROID) 150 MCG tablet Take 1 tablet (150 mcg total) by mouth daily. 09/16/16  Yes Eustaquio Maize, MD  metFORMIN (GLUCOPHAGE) 500 MG tablet Take 1 tablet (500 mg total) by mouth daily with breakfast. 09/16/16  Yes Eustaquio Maize, MD  metoprolol tartrate (LOPRESSOR) 100 MG tablet Take 1 tablet (100 mg total) by mouth daily. 09/16/16  Yes Eustaquio Maize, MD  rosuvastatin (CRESTOR) 40 MG tablet TAKE ONE TABLET BY MOUTH EVERY  DAY 09/16/16  Yes Eustaquio Maize, MD    Family History Family History  Problem Relation Age of Onset  . Stroke Father   . Cancer Sister     Social History Social History   Tobacco Use  . Smoking status: Current Some Day Smoker    Packs/day: 1.00    Types: Cigarettes    Last attempt to quit: 09/10/2010    Years since quitting: 6.4  . Smokeless tobacco: Never Used  Substance Use Topics  . Alcohol use: No  . Drug use: No     Allergies   Morphine and related   Review of Systems Review of Systems  Constitutional: Positive for fatigue. Negative for chills and fever.  HENT: Negative for congestion.     Eyes: Negative for visual disturbance.  Respiratory: Positive for shortness of breath.   Cardiovascular: Positive for chest pain.  Gastrointestinal: Positive for abdominal pain and nausea. Negative for vomiting.  Genitourinary: Negative for dysuria and flank pain.  Musculoskeletal: Negative for back pain, neck pain and neck stiffness.  Skin: Negative for rash.  Neurological: Negative for light-headedness and headaches.     Physical Exam Updated Vital Signs BP 126/62   Pulse (!) 103   Temp 98 F (36.7 C) (Oral)   Resp 19   SpO2 95%   Physical Exam  Constitutional: He is oriented to person, place, and time. He appears well-developed and well-nourished.  HENT:  Head: Normocephalic and atraumatic.  Dry mm  Eyes: Conjunctivae are normal. Right eye exhibits no discharge. Left eye exhibits no discharge.  Neck: Normal range of motion. Neck supple. No tracheal deviation present.  Cardiovascular: Regular rhythm and normal pulses. Tachycardia present.  Pulmonary/Chest: Effort normal and breath sounds normal.  Abdominal: Soft. He exhibits no distension. There is tenderness (epig). There is no guarding.  Musculoskeletal: He exhibits no edema.  Neurological: He is alert and oriented to person, place, and time.  Skin: Skin is warm. No rash noted.  Psychiatric: He has a normal mood and affect.  Nursing note and vitals reviewed.    ED Treatments / Results  Labs (all labs ordered are listed, but only abnormal results are displayed) Labs Reviewed  BASIC METABOLIC PANEL - Abnormal; Notable for the following components:      Result Value   Sodium 130 (*)    Chloride 97 (*)    CO2 20 (*)    Glucose, Bld 162 (*)    GFR calc non Af Amer 52 (*)    All other components within normal limits  CBC WITH DIFFERENTIAL/PLATELET - Abnormal; Notable for the following components:   WBC 14.5 (*)    RBC 2.64 (*)    Hemoglobin 8.4 (*)    HCT 25.7 (*)    RDW 23.4 (*)    Platelets 72 (*)    Neutro  Abs 12.9 (*)    All other components within normal limits  URINALYSIS, ROUTINE W REFLEX MICROSCOPIC - Abnormal; Notable for the following components:   APPearance HAZY (*)    Specific Gravity, Urine 1.034 (*)    Hgb urine dipstick SMALL (*)    Protein, ur 100 (*)    Bacteria, UA FEW (*)    Squamous Epithelial / LPF 0-5 (*)    All other components within normal limits  I-STAT TROPONIN, ED - Abnormal; Notable for the following components:   Troponin i, poc 0.70 (*)    All other components within normal limits  I-STAT CG4 LACTIC ACID, ED -  Abnormal; Notable for the following components:   Lactic Acid, Venous 2.74 (*)    All other components within normal limits  URINE CULTURE  CULTURE, BLOOD (ROUTINE X 2)  CULTURE, BLOOD (ROUTINE X 2)  LACTIC ACID, PLASMA  I-STAT TROPONIN, ED  POC OCCULT BLOOD, ED  TYPE AND SCREEN    EKG  EKG Interpretation  Date/Time:  Friday February 10 2017 12:13:38 EST Ventricular Rate:  106 PR Interval:    QRS Duration: 136 QT Interval:  341 QTC Calculation: 453 R Axis:   83 Text Interpretation:  Sinus tachycardia Multiple ventricular premature complexes Right bundle branch block Inferior infarct, age indeterminate Confirmed by Elnora Morrison [phone]) on 02/10/2017 12:22:17 PM       Radiology Dg Chest 2 View  Result Date: 02/10/2017 CLINICAL DATA:  Shortness of breath EXAM: CHEST  2 VIEW COMPARISON:  05/27/2013 FINDINGS: Minimal enlargement of cardiac silhouette post CABG. Atherosclerotic calcification aorta. Mediastinal contours and pulmonary vascularity normal. Emphysematous and bronchitic changes consistent with COPD. No acute infiltrate, pleural effusion or pneumothorax. Osseous demineralization with mild degenerative disc disease changes of the thoracic spine. IMPRESSION: Mild enlargement of cardiac silhouette post CABG. COPD changes without acute infiltrate. Electronically Signed   By: Lavonia Dana M.D.   On: 02/10/2017 12:28   Ct Angio Chest/abd/pel  For Dissection W And/or Wo Contrast  Result Date: 02/10/2017 CLINICAL DATA:  84 year old male with chest abdominal pain with shortness of breath for 3 days. History of abdominal aortic aneurysm repair. EXAM: CT ANGIOGRAPHY CHEST, ABDOMEN AND PELVIS TECHNIQUE: Multidetector CT imaging through the chest, abdomen and pelvis was performed using the standard protocol during bolus administration of intravenous contrast. Multiplanar reconstructed images and MIPs were obtained and reviewed to evaluate the vascular anatomy. CONTRAST:  170mL ISOVUE-370 IOPAMIDOL (ISOVUE-370) INJECTION 76% COMPARISON:  02/10/2017 and prior chest radiographs. 10/13/2011 abdominal/pelvic CT. FINDINGS: CTA CHEST FINDINGS Cardiovascular: Upper limits normal heart size, coronary artery calcifications and CABG changes noted. Thoracic aortic atherosclerotic plaque/calcifications noted without aneurysm or dissection. No pericardial effusion or large/central pulmonary emboli. Mediastinum/Nodes: No enlarged mediastinal, hilar, or axillary lymph nodes. Thyroid gland, trachea, and esophagus demonstrate no significant findings. Lungs/Pleura: An 8 mm nodular opacity within the lingula (7:103) is now identified. No other nodule or mass noted. Trace bilateral pleural effusions and minimal bibasilar atelectasis noted No airspace disease, consolidation or pneumothorax noted Musculoskeletal: No acute abnormality or suspicious bony lesion. Review of the MIP images confirms the above findings. CTA ABDOMEN AND PELVIS FINDINGS VASCULAR The abdominal aorta/graft is unchanged with the aorta again measuring 3.7 cm in greatest diameter. Aortic atherosclerotic plaque/calcifications again noted. There is no evidence of aortic dissection. No change in atherosclerotic calcification within the major branch vessels. 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 Right common iliac scratch de bilateral common iliac artery aneurysms are unchanged  measuring 1.6 cm on the right and 1.8 cm on the left. Review of the MIP images confirms the above findings. NON-VASCULAR Hepatobiliary: Probable hepatic steatosis again noted. Cholelithiasis identified without CT evidence of acute cholecystitis. No biliary dilatation. Pancreas: Unremarkable Spleen: Unremarkable Adrenals/Urinary Tract: An 8 mm calculus at the left UVJ/bladder noted. An 8 mm bladder calculus within the right dependent aspect noted. There is no evidence of hydronephrosis. Bilateral renal cysts are present. The adrenal glands are unremarkable and unchanged. Stomach/Bowel: No evidence of bowel obstruction, definite bowel wall thickening or bowel inflammatory changes. Colonic diverticulosis noted without evidence of diverticulitis. Lymphatic: No enlarged lymph nodes. Reproductive: Prostate enlargement  again noted Other: No ascites, abscess or pneumoperitoneum. Musculoskeletal: No acute abnormality. Degenerative changes within the lumbar spine again noted. Review of the MIP images confirms the above findings. IMPRESSION: 1. No evidence of aortic dissection or central/large pulmonary emboli. 2. Aortic atherosclerotic calcifications with abdominal aortic aneurysm repair, unchanged appearance of abdominal aorta and unchanged bilateral common iliac artery aneurysms. 3. 8 mm lingular nodule. Consider one of the following in 3 months for both low-risk and high-risk individuals: (a) repeat chest CT, (b) follow-up PET-CT, or (c) tissue sampling. This recommendation follows the consensus statement: Guidelines for Management of Incidental Pulmonary Nodules Detected on CT Images: From the Fleischner Society 2017; Radiology 2017; 284:228-243. 4. 8 mm left UVJ versus left bladder calculus. No evidence of hydronephrosis. 8 mm right bladder calculus. 5. Trace bilateral pleural effusions and mild bibasilar atelectasis 6. Probable hepatic steatosis. 7. Cholelithiasis without CT evidence of acute cholecystitis 8. Aortic  Atherosclerosis (ICD10-I70.0) and Emphysema (ICD10-J43.9). Electronically Signed   By: Margarette Canada M.D.   On: 02/10/2017 13:58    Procedures .Critical Care Performed by: Elnora Morrison, MD Authorized by: Elnora Morrison, MD   Critical care provider statement:    Critical care time (minutes):  35   Critical care start time:  02/10/2017 1:00 PM   Critical care end time:  02/10/2017 1:35 PM   Critical care time was exclusive of:  Separately billable procedures and treating other patients and teaching time   Critical care was necessary to treat or prevent imminent or life-threatening deterioration of the following conditions:  Sepsis   Critical care was time spent personally by me on the following activities:  Examination of patient, evaluation of patient's response to treatment, ordering and review of laboratory studies and ordering and review of radiographic studies   I assumed direction of critical care for this patient from another provider in my specialty: no     (including critical care time)  Medications Ordered in ED Medications  0.9 %  sodium chloride infusion ( Intravenous New Bag/Given 02/10/17 1252)  piperacillin-tazobactam (ZOSYN) IVPB 3.375 g (3.375 g Intravenous New Bag/Given 02/10/17 1523)  acetaminophen (TYLENOL) tablet 1,000 mg (1,000 mg Oral Given 02/10/17 1351)  iopamidol (ISOVUE-370) 76 % injection 100 mL (100 mLs Intravenous Contrast Given 02/10/17 1321)  sodium chloride 0.9 % bolus 1,000 mL (1,000 mLs Intravenous New Bag/Given 02/10/17 1522)     Initial Impression / Assessment and Plan / ED Course  I have reviewed the triage vital signs and the nursing notes.  Pertinent labs & imaging results that were available during my care of the patient were reviewed by me and considered in my medical decision making (see chart for details).    Patient with complicated medical history presents with worsening upper abdominal and chest pressure. Plan for cardiac screening, CT scan for  more details of his aorta and infection screening low-grade fever.  From early sepsis concerns antibiotics ordered, blood cultures, lactate mild elevated. Fluid bolus ordered.  From chest pain and mild troponin elevation likely demand with other concerns going on and symptomatic anemia. Plan for Hemoccult test.  CT scan revealed multiple different findings including pulmonary nodule, kidney stone, urinalysis pending. Plan for in and out cath. Antibiotics have been given to cover possibly infected stone/early sepsis.  Plan to talk to cardiology and hospitalist and likely transfer to higher level of care/ hospital with specialist.  Hospitalist will assess in ED and coordinate care.      Final Clinical Impressions(s) / ED Diagnoses  Final diagnoses:  Acute chest pain  Epigastric pain  Troponin level elevated  Symptomatic anemia  Kidney stone on left side  Thrombocytopenia (HCC)  Sepsis, due to unspecified organism [facility])    ED Discharge Orders    None       Elnora Morrison, MD 02/10/17 1546

## 2017-02-10 NOTE — H&P (Addendum)
History and Physical    Dillon Sweeney WFU:[id number] DOB: 01/26/1933 DOA: 02/10/2017  PCP: Eustaquio Maize, MD   Patient coming from: Home  Chief Complaint: Chest Pain, SOB  HPI: Dillon Sweeney is a 84 y.o. male with medical history significant for CABG- 1986, AAA, DM, HTN, Hypothyroidism, ED today with complaints of chest pain and difficulty breathing of 3 days duration.  Patient reports pressure-like chest pain radiating to both arms.  Patient also reports upper abdominal pain that started about the same time.  Patient denies associated dizziness.  Patient reports black stools for about 2 weeks, last black stool was 5-6 days ago.  No bloody stools no vomiting. Patient reports a cough of 2-3 weeks duration, productive of brownish sputum. He denies fevers or chills at home, no dysuria or frequency, no loose stools, no headaches or neck stiffness.  Patient's says he quit smoking cigarettes 2 months ago, smoked ~1 pack per week. EMS was at his place yesterday, for chest pain the patient did not want to come to the ED.  ED Course: Temperature recorded in the ED max 100.6.,  Blood pressure systolic 20-254, mild tachycardia up to 104, with tachypnea.  Sodium mildly low at 130, WBC elevated at 14.5, hemoglobin drop to 8.4, no platelets 72.  Troponin i-STAT elevated at 0.7, lactic acidosis 2.7, UA showed few bacteria.  Patient has history of AAA and complaints of abdominal and chest pain- CTA chest/abdomen/pelvis-negative for dissection or emboli, showed a 8 mm lingular nodule.  She was started on IV Zosyn in the ED, 1L bolus was given.  Review of Systems: As per HPI otherwise 10 point review of systems negative.   Past Medical History:  Diagnosis Date  . AAA (abdominal aortic aneurysm) ([facility])   . Atherosclerosis   . Bladder stones   . CAD (coronary artery disease)   . Cholelithiasis   . Chronic constipation   . Diabetes mellitus    Type 2  . Dyslipidemia   . Hyperlipidemia   . Hypertension     . Hypothyroidism   . Myocardial infarction ([facility]) 09/22/10   1986  . Pneumonia 2012    Past Surgical History:  Procedure Laterality Date  . ABDOMINAL AORTIC ANEURYSM REPAIR  09/15/10   Juxtarenal AAA repair   . APPENDECTOMY    . CORONARY ARTERY BYPASS GRAFT  1986   left leg vein harvest  . IR GENERIC HISTORICAL  03/04/2016   IR ANGIO VERTEBRAL SEL VERTEBRAL BILAT MOD SED 03/04/2016 Luanne Bras, MD [facility]  . IR GENERIC HISTORICAL  03/04/2016   IR ANGIO INTRA EXTRACRAN SEL COM CAROTID INNOMINATE BILAT MOD SED 03/04/2016 Luanne Bras, MD [facility]  . TONSILLECTOMY      reports that he has been smoking cigarettes.  He has been smoking about 1.00 pack per day. he has never used smokeless tobacco. He reports that he does not drink alcohol or use drugs.  Allergies  Allergen Reactions  . Morphine And Related     hallucinations    Family History  Problem Relation Age of Onset  . Stroke Father   . Cancer Sister     Prior to Admission medications   Medication Sig Start Date End Date Taking? Authorizing Provider  acetaminophen (TYLENOL) 500 MG tablet Take 1,000 mg by mouth every 6 (six) hours as needed for mild pain or moderate pain.   Yes [provider]  amLODipine (NORVASC) 5 MG tablet Take 1 tablet (5 mg total) by mouth daily.  09/16/16  Yes Eustaquio Maize, MD  aspirin 81 MG tablet Take 81 mg by mouth daily.     Yes [provider]  benazepril (LOTENSIN) 40 MG tablet Take 1 tablet (40 mg total) by mouth daily. 09/16/16  Yes Eustaquio Maize, MD  Cholecalciferol (VITAMIN D) 2000 UNITS CAPS Take 1 capsule by mouth daily.    Yes [provider]  levothyroxine (SYNTHROID, LEVOTHROID) 150 MCG tablet Take 1 tablet (150 mcg total) by mouth daily. 09/16/16  Yes Eustaquio Maize, MD  metFORMIN (GLUCOPHAGE) 500 MG tablet Take 1 tablet (500 mg total) by mouth daily with breakfast. 09/16/16  Yes Eustaquio Maize, MD  metoprolol tartrate (LOPRESSOR) 100 MG  tablet Take 1 tablet (100 mg total) by mouth daily. 09/16/16  Yes Eustaquio Maize, MD  rosuvastatin (CRESTOR) 40 MG tablet TAKE ONE TABLET BY MOUTH EVERY DAY 09/16/16  Yes Eustaquio Maize, MD    Physical Exam: Vitals:   02/10/17 1400 02/10/17 1430 02/10/17 1500 02/10/17 1530  BP: 123/62 (!) 104/56 126/62 (!) 116/53  Pulse: (!) 106 (!) 101 (!) 103 (!) 44  Resp: (!) 24 (!) 26 19 19   Temp:      TempSrc:      SpO2: 99% 96% 95% 97%    Constitutional: NAD, calm, comfortable Vitals:   02/10/17 1400 02/10/17 1430 02/10/17 1500 02/10/17 1530  BP: 123/62 (!) 104/56 126/62 (!) 116/53  Pulse: (!) 106 (!) 101 (!) 103 (!) 44  Resp: (!) 24 (!) 26 19 19   Temp:      TempSrc:      SpO2: 99% 96% 95% 97%   Eyes: PERRL, lids and conjunctivae normal ENMT: Mucous membranes are moist. Posterior pharynx clear of any exudate or lesions.  Neck: normal, supple, no masses, no thyromegaly Respiratory: Minimal inspiratory wheezing diffusely , normal respiratory effort. No accessory muscle use.  Cardiovascular: Regular rate and rhythm, no murmurs / rubs / gallops. No extremity edema. 2+ pedal pulses. No carotid bruits.  Abdomen: no tenderness, no masses palpated. No hepatosplenomegaly. Bowel sounds positive.  Musculoskeletal: no clubbing / cyanosis. No joint deformity upper and lower extremities. Good ROM, no contractures. Normal muscle tone.  Skin: no rashes, lesions, ulcers. No induration Neurologic: CN 2-12 grossly intact. Strength 5/5 in all 4.  Psychiatric: Normal judgment and insight. Alert and oriented x 3. Normal mood.   Labs on Admission: I have personally reviewed following labs and imaging studies  CBC: Recent Labs  Lab 02/10/17 1229  WBC 14.5*  NEUTROABS 12.9*  HGB 8.4*  HCT 25.7*  MCV 97.3  PLT 72*   Basic Metabolic Panel: Recent Labs  Lab 02/10/17 1229  NA 130*  K 4.0  CL 97*  CO2 20*  GLUCOSE 162*  BUN 20  CREATININE 1.24  CALCIUM 9.2   Urine analysis:    Component  Value Date/Time   COLORURINE YELLOW 02/10/2017 1246   APPEARANCEUR HAZY (A) 02/10/2017 1246   LABSPEC 1.034 (H) 02/10/2017 1246   PHURINE 5.0 02/10/2017 1246   GLUCOSEU NEGATIVE 02/10/2017 1246   HGBUR SMALL (A) 02/10/2017 1246   BILIRUBINUR NEGATIVE 02/10/2017 1246   KETONESUR NEGATIVE 02/10/2017 1246   PROTEINUR 100 (A) 02/10/2017 1246   UROBILINOGEN 1.0 09/22/2010 1638   NITRITE NEGATIVE 02/10/2017 1246   LEUKOCYTESUR NEGATIVE 02/10/2017 1246    Radiological Exams on Admission: Dg Chest 2 View  Result Date: 02/10/2017 CLINICAL DATA:  Shortness of breath EXAM: CHEST  2 VIEW COMPARISON:  05/27/2013 FINDINGS: Minimal  enlargement of cardiac silhouette post CABG. Atherosclerotic calcification aorta. Mediastinal contours and pulmonary vascularity normal. Emphysematous and bronchitic changes consistent with COPD. No acute infiltrate, pleural effusion or pneumothorax. Osseous demineralization with mild degenerative disc disease changes of the thoracic spine. IMPRESSION: Mild enlargement of cardiac silhouette post CABG. COPD changes without acute infiltrate. Electronically Signed   By: Lavonia Dana M.D.   On: 02/10/2017 12:28   Ct Angio Chest/abd/pel For Dissection W And/or Wo Contrast  Result Date: 02/10/2017 CLINICAL DATA:  84 year old male with chest abdominal pain with shortness of breath for 3 days. History of abdominal aortic aneurysm repair. EXAM: CT ANGIOGRAPHY CHEST, ABDOMEN AND PELVIS TECHNIQUE: Multidetector CT imaging through the chest, abdomen and pelvis was performed using the standard protocol during bolus administration of intravenous contrast. Multiplanar reconstructed images and MIPs were obtained and reviewed to evaluate the vascular anatomy. CONTRAST:  169mL ISOVUE-370 IOPAMIDOL (ISOVUE-370) INJECTION 76% COMPARISON:  02/10/2017 and prior chest radiographs. 10/13/2011 abdominal/pelvic CT. FINDINGS: CTA CHEST FINDINGS Cardiovascular: Upper limits normal heart size, coronary artery  calcifications and CABG changes noted. Thoracic aortic atherosclerotic plaque/calcifications noted without aneurysm or dissection. No pericardial effusion or large/central pulmonary emboli. Mediastinum/Nodes: No enlarged mediastinal, hilar, or axillary lymph nodes. Thyroid gland, trachea, and esophagus demonstrate no significant findings. Lungs/Pleura: An 8 mm nodular opacity within the lingula (7:103) is now identified. No other nodule or mass noted. Trace bilateral pleural effusions and minimal bibasilar atelectasis noted No airspace disease, consolidation or pneumothorax noted Musculoskeletal: No acute abnormality or suspicious bony lesion. Review of the MIP images confirms the above findings. CTA ABDOMEN AND PELVIS FINDINGS VASCULAR The abdominal aorta/graft is unchanged with the aorta again measuring 3.7 cm in greatest diameter. Aortic atherosclerotic plaque/calcifications again noted. There is no evidence of aortic dissection. No change in atherosclerotic calcification within the major branch vessels. 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 Right common iliac scratch de bilateral common iliac artery aneurysms are unchanged measuring 1.6 cm on the right and 1.8 cm on the left. Review of the MIP images confirms the above findings. NON-VASCULAR Hepatobiliary: Probable hepatic steatosis again noted. Cholelithiasis identified without CT evidence of acute cholecystitis. No biliary dilatation. Pancreas: Unremarkable Spleen: Unremarkable Adrenals/Urinary Tract: An 8 mm calculus at the left UVJ/bladder noted. An 8 mm bladder calculus within the right dependent aspect noted. There is no evidence of hydronephrosis. Bilateral renal cysts are present. The adrenal glands are unremarkable and unchanged. Stomach/Bowel: No evidence of bowel obstruction, definite bowel wall thickening or bowel inflammatory changes. Colonic diverticulosis noted without evidence of diverticulitis. Lymphatic: No  enlarged lymph nodes. Reproductive: Prostate enlargement again noted Other: No ascites, abscess or pneumoperitoneum. Musculoskeletal: No acute abnormality. Degenerative changes within the lumbar spine again noted. Review of the MIP images confirms the above findings. IMPRESSION: 1. No evidence of aortic dissection or central/large pulmonary emboli. 2. Aortic atherosclerotic calcifications with abdominal aortic aneurysm repair, unchanged appearance of abdominal aorta and unchanged bilateral common iliac artery aneurysms. 3. 8 mm lingular nodule. Consider one of the following in 3 months for both low-risk and high-risk individuals: (a) repeat chest CT, (b) follow-up PET-CT, or (c) tissue sampling. This recommendation follows the consensus statement: Guidelines for Management of Incidental Pulmonary Nodules Detected on CT Images: From the Fleischner Society 2017; Radiology 2017; 284:228-243. 4. 8 mm left UVJ versus left bladder calculus. No evidence of hydronephrosis. 8 mm right bladder calculus. 5. Trace bilateral pleural effusions and mild bibasilar atelectasis 6. Probable hepatic steatosis. 7. Cholelithiasis without CT  evidence of acute cholecystitis 8. Aortic Atherosclerosis (ICD10-I70.0) and Emphysema (ICD10-J43.9). Electronically Signed   By: Margarette Canada M.D.   On: 02/10/2017 13:58    EKG: Independently reviewed. Sinus tachycardia, RBBB, no significant change from prior.  Assessment/Plan Principal Problem:   Chest pain Active Problems:   Abdominal aortic aneurysm ([facility])   Diabetes ([facility])   HTN (hypertension)   Tobacco use disorder   Myocardial infarction   Lung nodule  Chest pain- with SOB, high risk with history of CABG- 1986. Trop I-stat 0.7 >0.93.  Possibly demand ischemia but cannot rule out ACS. EKG unchanged from prior.  CTA chest abdomen pelvis negative for dissection or PE. - Trop X3 - Statin - EKG a.m - Echo -Talked to cardiology Dr. Percival Spanish, agrees with transfer to [facility], also agreed no anticoagulation. -Called Cards master for pt to be seen at [facility] -No anticoagulation, Hgb dropped from baseline ~12, to 8. - Aspirin 325 x 1 - soft bp, possible sepsis, but will start metop 12.5 with holding parameters, for systolic <993. - NPO midnight -At this time patient is still chest pain-free, will sign patient out to the on-call night coverage.  Possible sepsis-patient meets SIRS criteria.  At this time no focus of infection.  Tachycardia, tachypnea, fever- 100.6, leukocytosis 14.5.  Elevated lactic acid 2.7 >2.3.  IV Zosyn started in ED. UA- few bact, not convincing for source, CTA-no pulm, or abdominal source identified. - Continue IV Zosyn started in ED, will start IV vancomycin, de-escalate pending cultures and clinical status. - Follow up blood cultures, and urine cultures drawn in Ed -Maintenance IV fluids n/s -500 ml bolus Ns - Trend lactic acid  Acute anemia -8, baseline ~12, epigastric pain. Denies NSAID use. On 81mg  daily aspirin.  ED FOBT- negative -Hold antiplatelets and anticoagulation - GI consult a.m. - IV protonix 40 BID - NPO midnight  Lung Nodule-26mm, incidental finding.  Tobacco use history. Recs by radiologist. Consider one of the following in 3 months for both low-risk and high-risk individuals: (a) repeat chest CT, (b) follow-up PET-CT, or (c) tissue sampling - F/u as outpt  DM- cbg- 124, Home meds metformin 500 daily. LAst Hba1c- 5.9. 09/16/16 - SSI   DVT prophylaxis: SCDs Code Status: Full- confirmed with patient at bedside Family Communication: None at bedside Disposition Plan: To be determined Consults called:  Cards Admission status: Inpt, tele   Bethena Roys MD [facility] Pager [phone]  If 11PM-7AM, please contact night-coverage [email or web address] Password [facility]  02/10/2017, 4:18 PM

## 2017-02-10 NOTE — ED Notes (Signed)
CRITICAL VALUE ALERT  Critical Value:  Troponin 1.31  Date & Time Notied: 02/10/17 at 1951  Provider Notified: Jenetta Downer   Orders Received/Actions taken: Awaiting orders/No orders at this time

## 2017-02-10 NOTE — ED Triage Notes (Signed)
Pt c/o cp x 3 days with sob. States pressure to chest rating 8. mildy pale. Ems came by yesterday per pt but states he did not want to come.

## 2017-02-10 NOTE — Progress Notes (Addendum)
Subjective:   Patient ID: Dillon Sweeney, male    DOB: 1932-03-28, 84 y.o.   MRN: [id number] CC: Follow-up (EMS, Bilateral Arm Pain)  HPI: Dillon Sweeney is a 84 y.o. male with history of coronary artery disease, abdominal aortic aneurysm, diabetes, hypertension presenting with chest pain, shortness of breath after calling EMS yesterday for chest pain and bilateral arm pain.  Called EMS yesterday to his house due to chest pain, at that time was also having bilateral arm pain Was evaluated but was not brought to the emergency room Has had chest pain for the past 2 days, feeling more short of breath Has had shortness of breath for several months with exertion but has been worsened for the last few days  Has been coughing some, sometimes productive Appetite has been down for the last 2 days Having some stomach pain, some right sided chest pain, some pain in the middle of his back Here today with his daughter who says his pain is been constant for the last couple of days Last stool was 6-7 days ago Has not been able to eat or drink for the last couple of days Says he has been making minimal urine for the last few days, he says this is not any different than usual for him though.  Noted to have a fever today in clinic  Relevant past medical, surgical, family and social history reviewed. Allergies and medications reviewed and updated. Social History   Tobacco Use  Smoking Status Current Some Day Smoker  . Packs/day: 1.00  . Types: Cigarettes  . Last attempt to quit: 09/10/2010  . Years since quitting: 6.4  Smokeless Tobacco Never Used   ROS: Per HPI    Objective:    BP 115/63   Pulse (!) 109   Temp (!) 100.6 F (38.1 C) (Oral)   Resp (!) 22   Ht 5\' 9"  (1.753 m)   Wt 180 lb 3.2 oz (81.7 kg)   SpO2 96%   BMI 26.61 kg/m   Wt Readings from Last 3 Encounters:  02/10/17 180 lb 3.2 oz (81.7 kg)  09/16/16 185 lb (83.9 kg)  06/16/16 190 lb 9.6 oz (86.5 kg)    Gen: ill  appearing, alert, cooperative with exam, NCAT, not able to stand for longer than about 10 seconds before needing to sit down to rest.  O2 sats staying in mid 90s with standing. EYES: EOMI, no conjunctival injection, or no icterus ENT:  TMs pearly gray b/l, OP without erythema LYMPH: no cervical LAD CV: NRRR, normal S1/S2, no murmur, distal pulses 2+ b/l Resp: Scattered rhonchi that clear with cough, no crackles, no wheezes, increased work of breathing with tachypnea, speaks in phrases Abd: +BS, soft, NTND. no guarding or organomegaly Ext: No edema, warm Neuro: Alert and oriented, strength equal b/l UE and LE, coordination grossly normal MSK: normal muscle bulk  Preliminary read by Assunta Found, MD:  CXR: Surgical clips present, well-defined diaphragm borders, no definite opacity seen  Assessment & Plan:  Dillon Sweeney was seen today for fever, chest pain. H/o CAD s/p CAGB. Suspect dehydration and pneumonia, referred patient to the emergency room for lab work, further imaging given complicated PMH to rule out cardiac cause of pain.   Diagnoses and all orders for this visit:  Shortness of breath -     DG Chest 2 View; Future  Fever, unspecified fever cause -     DG Chest 2 View; Future  Chest pain, unspecified type  Follow up plan: Next week Assunta Found, MD [place]

## 2017-02-10 NOTE — Progress Notes (Addendum)
Pharmacy Note:  Initial antibiotic(s) regimen of Vancomycin and zosyn ordered by EDP to treat fever of unknown origin  Estimated Creatinine Clearance: 45.9 mL/min (by C-G formula based on SCr of 1.24 mg/dL).   Allergies  Allergen Reactions  . Morphine And Related     hallucinations    Vitals:   02/10/17 1730 02/10/17 1800  BP: 118/68 130/85  Pulse: (!) 43 (!) 122  Resp: 19 (!) 24  Temp:    SpO2: 99% 99%    Anti-infectives (From admission, onward)   Start     Dose/Rate Route Frequency Ordered Stop   02/10/17 1900  vancomycin (VANCOCIN) 1,500 mg in sodium chloride 0.9 % 500 mL IVPB     1,500 mg 250 mL/hr over 120 Minutes Intravenous  Once 02/10/17 1834     02/10/17 1445  cefTRIAXone (ROCEPHIN) 1 g in dextrose 5 % 50 mL IVPB  Status:  Discontinued     1 g 100 mL/hr over 30 Minutes Intravenous  Once 02/10/17 1441 02/10/17 1444   02/10/17 1445  piperacillin-tazobactam (ZOSYN) IVPB 3.375 g     3.375 g 100 mL/hr over 30 Minutes Intravenous  Once 02/10/17 1444 02/10/17 1553      Plan: Initial dose(s) of Vancomycin 1500mg  IV and zosyn 3.375gm X 1 ordered. F/U admission orders for further dosing if therapy continued.  Isac Sarna, BS [place], BCPS Clinical Pharmacist Pager [phone]  02/10/2017 6:36 PM   ADDENDUM: CONTINUE with zosyn 3.375gm IV q8h EID over 4 hours Continue with Vancomycin 750mg  IV q12h F/U cxs and clinical progress Monitor V/S, labs and levels as indicated  Isac Sarna, BS [place], BCPS Clinical Pharmacist Pager [phone]

## 2017-02-10 NOTE — ED Notes (Signed)
CRITICAL VALUE ALERT  Critical Value:  Istat Troponin 0.95  Date & Time Notied:  02/10/17 1550   Provider Notified: Dr. Denton Brick  Orders Received/Actions taken: MD notified, no further orders given

## 2017-02-11 ENCOUNTER — Inpatient Hospital Stay (HOSPITAL_COMMUNITY): Payer: Medicare Other

## 2017-02-11 DIAGNOSIS — I200 Unstable angina: Secondary | ICD-10-CM

## 2017-02-11 DIAGNOSIS — E7849 Other hyperlipidemia: Secondary | ICD-10-CM

## 2017-02-11 DIAGNOSIS — D62 Acute posthemorrhagic anemia: Secondary | ICD-10-CM

## 2017-02-11 DIAGNOSIS — I25700 Atherosclerosis of coronary artery bypass graft(s), unspecified, with unstable angina pectoris: Secondary | ICD-10-CM

## 2017-02-11 DIAGNOSIS — I10 Essential (primary) hypertension: Secondary | ICD-10-CM

## 2017-02-11 DIAGNOSIS — Z9889 Other specified postprocedural states: Secondary | ICD-10-CM

## 2017-02-11 DIAGNOSIS — I252 Old myocardial infarction: Secondary | ICD-10-CM

## 2017-02-11 LAB — BASIC METABOLIC PANEL
ANION GAP: 12 (ref 5–15)
BUN: 14 mg/dL (ref 6–20)
CALCIUM: 8.6 mg/dL — AB (ref 8.9–10.3)
CO2: 18 mmol/L — ABNORMAL LOW (ref 22–32)
Chloride: 101 mmol/L (ref 101–111)
Creatinine, Ser: 1.14 mg/dL (ref 0.61–1.24)
GFR calc non Af Amer: 58 mL/min — ABNORMAL LOW (ref >60)
Glucose, Bld: 150 mg/dL — ABNORMAL HIGH (ref 65–99)
Potassium: 3.6 mmol/L (ref 3.5–5.1)
SODIUM: 131 mmol/L — AB (ref 135–145)

## 2017-02-11 LAB — CBC
HCT: 22.6 % — ABNORMAL LOW (ref 39.0–52.0)
HCT: 24.1 % — ABNORMAL LOW (ref 39.0–52.0)
HEMOGLOBIN: 7.3 g/dL — AB (ref 13.0–17.0)
HEMOGLOBIN: 7.8 g/dL — AB (ref 13.0–17.0)
MCH: 31.6 pg (ref 26.0–34.0)
MCH: 31 pg (ref 26.0–34.0)
MCHC: 32.3 g/dL (ref 30.0–36.0)
MCHC: 32.4 g/dL (ref 30.0–36.0)
MCV: 97.8 fL (ref 78.0–100.0)
MCV: 95.6 fL (ref 78.0–100.0)
PLATELETS: 76 10*3/uL — AB (ref 150–400)
Platelets: 73 10*3/uL — ABNORMAL LOW (ref 150–400)
RBC: 2.31 MIL/uL — AB (ref 4.22–5.81)
RBC: 2.52 MIL/uL — AB (ref 4.22–5.81)
RDW: 23.2 % — ABNORMAL HIGH (ref 11.5–15.5)
RDW: 23.3 % — ABNORMAL HIGH (ref 11.5–15.5)
WBC: 10.9 10*3/uL — ABNORMAL HIGH (ref 4.0–10.5)
WBC: 12.5 10*3/uL — AB (ref 4.0–10.5)

## 2017-02-11 LAB — GLUCOSE, CAPILLARY: Glucose-Capillary: 163 mg/dL — ABNORMAL HIGH (ref 65–99)

## 2017-02-11 LAB — PREPARE RBC (CROSSMATCH)

## 2017-02-11 LAB — CBG MONITORING, ED
GLUCOSE-CAPILLARY: 147 mg/dL — AB (ref 65–99)
Glucose-Capillary: 161 mg/dL — ABNORMAL HIGH (ref 65–99)

## 2017-02-11 LAB — TROPONIN I
TROPONIN I: 1.11 ng/mL — AB (ref <0.03)
Troponin I: 0.48 ng/mL (ref <0.03)

## 2017-02-11 MED ORDER — SODIUM CHLORIDE 0.9 % IV SOLN
Freq: Once | INTRAVENOUS | Status: AC
  Administered 2017-02-11: 17:00:00 via INTRAVENOUS

## 2017-02-11 MED ORDER — SODIUM CHLORIDE 0.9 % IV SOLN: Freq: Once | INTRAVENOUS | Status: DC

## 2017-02-11 MED ORDER — LEVOTHYROXINE SODIUM 75 MCG PO TABS
150.0000 ug | ORAL_TABLET | Freq: Every day | ORAL | Status: DC
  Administered 2017-02-12: 150 ug via ORAL
  Filled 2017-02-11: qty 2

## 2017-02-11 MED ORDER — SODIUM CHLORIDE 0.9 % IV SOLN
INTRAVENOUS | Status: DC
  Administered 2017-02-12 – 2017-02-13 (×4): via INTRAVENOUS

## 2017-02-11 MED ORDER — SODIUM CHLORIDE 0.9 % IV SOLN
INTRAVENOUS | Status: DC
  Administered 2017-02-11: 23:00:00 via INTRAVENOUS

## 2017-02-11 MED ORDER — HALOPERIDOL LACTATE 5 MG/ML IJ SOLN
2.0000 mg | Freq: Four times a day (QID) | INTRAMUSCULAR | Status: DC | PRN
Start: 2017-02-11 — End: 2017-02-20
  Administered 2017-02-11 – 2017-02-19 (×5): 2 mg via INTRAVENOUS
  Filled 2017-02-11 (×5): qty 1

## 2017-02-11 MED ORDER — ONDANSETRON HCL 4 MG PO TABS: 4.0000 mg | ORAL_TABLET | Freq: Four times a day (QID) | ORAL | Status: DC | PRN

## 2017-02-11 MED ORDER — HEPARIN SODIUM (PORCINE) 5000 UNIT/ML IJ SOLN
5000.0000 [IU] | Freq: Three times a day (TID) | INTRAMUSCULAR | Status: DC
  Administered 2017-02-11 – 2017-02-13 (×5): 5000 [IU] via SUBCUTANEOUS
  Filled 2017-02-11 (×5): qty 1

## 2017-02-11 MED ORDER — ONDANSETRON HCL 4 MG/2ML IJ SOLN
4.0000 mg | Freq: Four times a day (QID) | INTRAMUSCULAR | Status: DC | PRN
Start: 2017-02-11 — End: 2017-02-21

## 2017-02-11 MED ORDER — PIPERACILLIN-TAZOBACTAM 3.375 G IVPB
3.3750 g | Freq: Three times a day (TID) | INTRAVENOUS | Status: DC
  Administered 2017-02-11 – 2017-02-12 (×2): 3.375 g via INTRAVENOUS
  Filled 2017-02-11 (×3): qty 50

## 2017-02-11 NOTE — Progress Notes (Addendum)
Pt became more confused all of sudden, irritable and anxious, Called MD, ordered Haldol,and given, right now MD in bed side, police officer in bed side (pt called 911), trying to talk with the patient. Meanwhile patient  Is refusing treatment and asking everyone to leave him alone

## 2017-02-11 NOTE — Progress Notes (Addendum)
New pt admission from [place]. Pt brought to the floor in stable condition but alert and oriented times 2 on assessment. Vitals taken. Initial Assessment done. All immediate pertinent needs to patient addressed. Patient Guide given to patient. Important safety instructions relating to hospitalization reviewed with patient. . Pt is dyspneic this time but pulse ox shows 98% on 2l via [place], Heart rate 123, informed to MD, plan to administer blood after type and screen, Will continue to monitor pt.

## 2017-02-11 NOTE — Progress Notes (Signed)
Pt is still confused, refusing his medicines and treatment. Trying to arrange for physical sitter, pt's family members aware, MD aware.Charge Nurse aware  Palma Holter, RN

## 2017-02-11 NOTE — Progress Notes (Addendum)
PROGRESS NOTE    Dillon Sweeney  WYO:[id number] DOB: November 21, 1932 DOA: 02/10/2017 PCP: Eustaquio Maize, MD     Brief Narrative:  84 year old man admitted on 1/11 after visiting his PCP for shortness of breath and being referred to the hospital.  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  Also has a history of AAA, diabetes, hypertension, hypothyroidism.  In the ED he also complained of chest pain that he described as pressure-like radiating to both arms. In the ED he was found to meet SIRS criteria and was given broad-spectrum antibiotics.  Because of elevated troponins with a high of 1.31, cardiology was consulted overnight who recommended transfer to [facility] for further evaluation.  However due to lack of bed availability patient remains in the emergency department at [facility]:   Principal Problem:   Chest pain Active Problems:   Abdominal aortic aneurysm (HCC)   Diabetes ([employer])   HTN (hypertension)   Tobacco use disorder   Myocardial infarction   Lung nodule   Chest pain/non-STEMI -Concerning for cardiac origin given his history and presentation. -Troponins maxed at 1.31, now down to 0.48 -EKG with right bundle branch block but no acute ischemic changes. -2D echo has been completed but results are pending at this time.   -Cardiology overnight recommended against initiation of heparin -Currently he is chest pain-free. -Plan is to transfer to [facility] for cardiology evaluation as we do not have cardiology available over the weekend.  SIRS -Patient meets SIRS criteria, however has no source of infection. -Was given vancomycin and Zosyn overnight, will discontinue antibiotics for now and monitor clinically. -Repeat lactic acid has normalized.  Lung nodule -Incidental finding, given his history of tobacco use, would recommend repeat CT chest in 3 months.  Diabetes -Fair control, A1c  pending.  Normocytic Anemia -Denies dark or red blood per rectum. -Check anemia panel. -Hemoglobin has further dropped to 7.3 today, given possibility of active cardiac ischemia we will transfuse 1 unit of PRBCs.   DVT prophylaxis: SQ heparin Code Status: Full code Family Communication: Patient only Disposition Plan: Transfer to [facility] for cardiology evaluation  Consultants:   Cardiology pending  Procedures:   2D echocardiogram pending  Antimicrobials:  Anti-infectives (From admission, onward)   Start     Dose/Rate Route Frequency Ordered Stop   02/11/17 0800  vancomycin (VANCOCIN) IVPB 750 mg/150 ml premix     750 mg 150 mL/hr over 60 Minutes Intravenous Every 12 hours 02/10/17 2035     02/10/17 2200  piperacillin-tazobactam (ZOSYN) IVPB 3.375 g     3.375 g 12.5 mL/hr over 240 Minutes Intravenous Every 8 hours 02/10/17 2031     02/10/17 1900  vancomycin (VANCOCIN) 1,500 mg in sodium chloride 0.9 % 500 mL IVPB     1,500 mg 250 mL/hr over 120 Minutes Intravenous  Once 02/10/17 1834 02/10/17 2120   02/10/17 1445  cefTRIAXone (ROCEPHIN) 1 g in dextrose 5 % 50 mL IVPB  Status:  Discontinued     1 g 100 mL/hr over 30 Minutes Intravenous  Once 02/10/17 1441 02/10/17 1444   02/10/17 1445  piperacillin-tazobactam (ZOSYN) IVPB 3.375 g     3.375 g 100 mL/hr over 30 Minutes Intravenous  Once 02/10/17 1444 02/10/17 1553       Subjective: Patient feels well other than uncomfortable in ED stretcher.  Denies chest pain or shortness of  breath currently.  Objective: Vitals:   02/11/17 1100 02/11/17 1130 02/11/17 1200 02/11/17 1230  BP: (!) 125/99 115/67 110/73 131/69  Pulse: (!) 123 (!) 117    Resp: (!) 37 (!) 27 (!) 27 (!) 26  Temp:      TempSrc:      SpO2: 100% 95%      Intake/Output Summary (Last 24 hours) at 02/11/2017 1433 Last data filed at 02/11/2017 1019 Gross per 24 hour  Intake 3250 ml  Output -  Net 3250 ml   There were no vitals filed for this  visit.  Examination:  General exam: Alert, awake, oriented x 3 Respiratory system: Clear to auscultation. Respiratory effort normal. Cardiovascular system:RRR. No murmurs, rubs, gallops. Gastrointestinal system: Abdomen is nondistended, soft and nontender. No organomegaly or masses felt. Normal bowel sounds heard. Central nervous system: Alert and oriented. No focal neurological deficits. Extremities: No C/C/E, +pedal pulses Skin: No rashes, lesions or ulcers Psychiatry: Judgement and insight appear normal. Mood & affect appropriate.     Data Reviewed: I have personally reviewed following labs and imaging studies  CBC: Recent Labs  Lab 02/10/17 1229 02/10/17 2319  WBC 14.5* 10.9*  NEUTROABS 12.9*  --   HGB 8.4* 7.3*  HCT 25.7* 22.6*  MCV 97.3 97.8  PLT 72* 73*   Basic Metabolic Panel: Recent Labs  Lab 02/10/17 1229  NA 130*  K 4.0  CL 97*  CO2 20*  GLUCOSE 162*  BUN 20  CREATININE 1.24  CALCIUM 9.2   GFR: Estimated Creatinine Clearance: 45.9 mL/min (by C-G formula based on SCr of 1.24 mg/dL). Liver Function Tests: No results for input(s): AST, ALT, ALKPHOS, BILITOT, PROT, ALBUMIN in the last 168 hours. No results for input(s): LIPASE, AMYLASE in the last 168 hours. No results for input(s): AMMONIA in the last 168 hours. Coagulation Profile: No results for input(s): INR, PROTIME in the last 168 hours. Cardiac Enzymes: Recent Labs  Lab 02/10/17 1829 02/10/17 2320 02/11/17 0649  TROPONINI 1.31* 1.11* 0.48*   BNP (last 3 results) No results for input(s): PROBNP in the last 8760 hours. HbA1C: No results for input(s): HGBA1C in the last 72 hours. CBG: Recent Labs  Lab 02/10/17 1727 02/10/17 2253 02/11/17 0740 02/11/17 1241  GLUCAP 124* 114* 147* 161*   Lipid Profile: No results for input(s): CHOL, HDL, LDLCALC, TRIG, CHOLHDL, LDLDIRECT in the last 72 hours. Thyroid Function Tests: No results for input(s): TSH, T4TOTAL, FREET4, T3FREE, THYROIDAB in  the last 72 hours. Anemia Panel: No results for input(s): VITAMINB12, FOLATE, FERRITIN, TIBC, IRON, RETICCTPCT in the last 72 hours. Urine analysis:    Component Value Date/Time   COLORURINE YELLOW 02/10/2017 1246   APPEARANCEUR HAZY (A) 02/10/2017 1246   LABSPEC 1.034 (H) 02/10/2017 1246   PHURINE 5.0 02/10/2017 1246   GLUCOSEU NEGATIVE 02/10/2017 1246   HGBUR SMALL (A) 02/10/2017 1246   BILIRUBINUR NEGATIVE 02/10/2017 1246   KETONESUR NEGATIVE 02/10/2017 1246   PROTEINUR 100 (A) 02/10/2017 1246   UROBILINOGEN 1.0 09/22/2010 1638   NITRITE NEGATIVE 02/10/2017 1246   LEUKOCYTESUR NEGATIVE 02/10/2017 1246   Sepsis Labs: @LABRCNTIP (procalcitonin:4,lacticidven:4)  ) Recent Results (from the past 240 hour(s))  Blood culture (routine x 2)     Status: None (Preliminary result)   Collection Time: 02/10/17  3:25 PM  Result Value Ref Range Status   Specimen Description BLOOD RIGHT FOREARM  Final   Special Requests   Final    BOTTLES DRAWN AEROBIC AND ANAEROBIC Blood Culture adequate volume  Culture NO GROWTH < 24 HOURS  Final   Report Status PENDING  Incomplete  Blood culture (routine x 2)     Status: None (Preliminary result)   Collection Time: 02/10/17  3:37 PM  Result Value Ref Range Status   Specimen Description LEFT ANTECUBITAL  Final   Special Requests   Final    BOTTLES DRAWN AEROBIC AND ANAEROBIC Blood Culture adequate volume   Culture NO GROWTH < 24 HOURS  Final   Report Status PENDING  Incomplete         Radiology Studies: Dg Chest 2 View  Result Date: 02/10/2017 CLINICAL DATA:  Shortness of breath EXAM: CHEST  2 VIEW COMPARISON:  05/27/2013 FINDINGS: Minimal enlargement of cardiac silhouette post CABG. Atherosclerotic calcification aorta. Mediastinal contours and pulmonary vascularity normal. Emphysematous and bronchitic changes consistent with COPD. No acute infiltrate, pleural effusion or pneumothorax. Osseous demineralization with mild degenerative disc disease  changes of the thoracic spine. IMPRESSION: Mild enlargement of cardiac silhouette post CABG. COPD changes without acute infiltrate. Electronically Signed   By: Lavonia Dana M.D.   On: 02/10/2017 12:28   Ct Angio Chest/abd/pel For Dissection W And/or Wo Contrast  Result Date: 02/10/2017 CLINICAL DATA:  84 year old male with chest abdominal pain with shortness of breath for 3 days. History of abdominal aortic aneurysm repair. EXAM: CT ANGIOGRAPHY CHEST, ABDOMEN AND PELVIS TECHNIQUE: Multidetector CT imaging through the chest, abdomen and pelvis was performed using the standard protocol during bolus administration of intravenous contrast. Multiplanar reconstructed images and MIPs were obtained and reviewed to evaluate the vascular anatomy. CONTRAST:  152mL ISOVUE-370 IOPAMIDOL (ISOVUE-370) INJECTION 76% COMPARISON:  02/10/2017 and prior chest radiographs. 10/13/2011 abdominal/pelvic CT. FINDINGS: CTA CHEST FINDINGS Cardiovascular: Upper limits normal heart size, coronary artery calcifications and CABG changes noted. Thoracic aortic atherosclerotic plaque/calcifications noted without aneurysm or dissection. No pericardial effusion or large/central pulmonary emboli. Mediastinum/Nodes: No enlarged mediastinal, hilar, or axillary lymph nodes. Thyroid gland, trachea, and esophagus demonstrate no significant findings. Lungs/Pleura: An 8 mm nodular opacity within the lingula (7:103) is now identified. No other nodule or mass noted. Trace bilateral pleural effusions and minimal bibasilar atelectasis noted No airspace disease, consolidation or pneumothorax noted Musculoskeletal: No acute abnormality or suspicious bony lesion. Review of the MIP images confirms the above findings. CTA ABDOMEN AND PELVIS FINDINGS VASCULAR The abdominal aorta/graft is unchanged with the aorta again measuring 3.7 cm in greatest diameter. Aortic atherosclerotic plaque/calcifications again noted. There is no evidence of aortic dissection. No change  in atherosclerotic calcification within the major branch vessels. 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 Right common iliac scratch de bilateral common iliac artery aneurysms are unchanged measuring 1.6 cm on the right and 1.8 cm on the left. Review of the MIP images confirms the above findings. NON-VASCULAR Hepatobiliary: Probable hepatic steatosis again noted. Cholelithiasis identified without CT evidence of acute cholecystitis. No biliary dilatation. Pancreas: Unremarkable Spleen: Unremarkable Adrenals/Urinary Tract: An 8 mm calculus at the left UVJ/bladder noted. An 8 mm bladder calculus within the right dependent aspect noted. There is no evidence of hydronephrosis. Bilateral renal cysts are present. The adrenal glands are unremarkable and unchanged. Stomach/Bowel: No evidence of bowel obstruction, definite bowel wall thickening or bowel inflammatory changes. Colonic diverticulosis noted without evidence of diverticulitis. Lymphatic: No enlarged lymph nodes. Reproductive: Prostate enlargement again noted Other: No ascites, abscess or pneumoperitoneum. Musculoskeletal: No acute abnormality. Degenerative changes within the lumbar spine again noted. Review of the MIP images confirms the above findings. IMPRESSION:  1. No evidence of aortic dissection or central/large pulmonary emboli. 2. Aortic atherosclerotic calcifications with abdominal aortic aneurysm repair, unchanged appearance of abdominal aorta and unchanged bilateral common iliac artery aneurysms. 3. 8 mm lingular nodule. Consider one of the following in 3 months for both low-risk and high-risk individuals: (a) repeat chest CT, (b) follow-up PET-CT, or (c) tissue sampling. This recommendation follows the consensus statement: Guidelines for Management of Incidental Pulmonary Nodules Detected on CT Images: From the Fleischner Society 2017; Radiology 2017; 284:228-243. 4. 8 mm left UVJ versus left bladder calculus. No evidence  of hydronephrosis. 8 mm right bladder calculus. 5. Trace bilateral pleural effusions and mild bibasilar atelectasis 6. Probable hepatic steatosis. 7. Cholelithiasis without CT evidence of acute cholecystitis 8. Aortic Atherosclerosis (ICD10-I70.0) and Emphysema (ICD10-J43.9). Electronically Signed   By: Margarette Canada M.D.   On: 02/10/2017 13:58        Scheduled Meds: . insulin aspart  0-9 Units Subcutaneous TID WC  . metoprolol tartrate  12.5 mg Oral BID  . pantoprazole (PROTONIX) IV  40 mg Intravenous Q12H  . rosuvastatin  40 mg Oral q1800   Continuous Infusions: . sodium chloride Stopped (02/11/17 0554)  . piperacillin-tazobactam (ZOSYN)  IV 3.375 g (02/11/17 1351)  . vancomycin Stopped (02/11/17 1019)     LOS: 1 day    Time spent: 30 minutes. Greater than 50% of this time was spent in direct contact with the patient coordinating care.     Lelon Frohlich, MD [facility] Pager [phone]  If 7PM-7AM, please contact night-coverage [email or web address] Password [facility] 02/11/2017, 2:33 PM

## 2017-02-11 NOTE — Consult Note (Signed)
Cardiology Consultation:   Patient ID: ASAF ELMQUIST; [id number]; Jun 29, 1932   Admit date: 02/10/2017 Date of Consult: 02/11/2017  Primary Care Provider: Eustaquio Maize, MD Primary Cardiologist: New   Patient Profile:   Dillon Sweeney is a 84 y.o. male with a hx of CAD and CABG who is being seen today for the evaluation of elevated troponins and chest pain at the request of Dr. Lonny Prude.  History of Present Illness:   Mr. Dillon Sweeney is an 84 year old male with a past medical history significant for coronary disease and four-vessel CABG in 1986, abdominal aortic aneurysm status post repair in August 2012, diabetes, hypertension, and hypothyroidism who presented to the ED at [facility] on February 10, 2017 with complaints of chest pain and shortness of breath.  The pain was described as a pressure radiating to both arms.  He also had upper abdominal pain. He reports a history of black stools for about 2 weeks with no hematochezia, nausea, or vomiting.  He has had up to productive cough with brown colored sputum for the past 2-3 weeks.  He has a long history of tobacco abuse.  At [facility], hemoglobin was 8.4.  Initial troponin was 0.7, which then increased to 0.95, 1.31, 1.11, and most recently 0.48 at 649 this morning.  Cardiology was called by the hospitalist at [facility] and they recommended no IV heparin.  They did recommend transfer to [facility] for further evaluation. CT angiogram was negative for aortic dissection and pulmonary embolism.  It demonstrated the abdominal aorta measuring 3.7 cm in greatest diameter.  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  There were bilateral common iliac artery aneurysms measuring 1.6 cm on the right and 1.8 cm on the left.  There is also an 8 mm lingular nodule.  He had a fever of 100.6 and leukocytosis of 14.5 with elevated lactate and was presumptively given IV Zosyn and  vancomycin.  Hemoglobin drop to 7.3 and thus was given 1 unit of packed red blood cell transfusion.  An echocardiogram has been ordered and is pending.  I personally reviewed the ECG performed on 02/10/17 at 1213 which demonstrated sinus tachycardia with PVCs and a right bundle branch block with nonspecific inferior Q waves.  When I arrived to evaluate him, he was in the process of calling 911 and ordering the nurses out of the room. He adamantly denied having any chest pain or shortness of breath.  He refused my request to examine him.  He said, "I'm in [place] and I demand to be transferred to [place]".    Past Medical History:  Diagnosis Date  . AAA (abdominal aortic aneurysm) ([place])   . Atherosclerosis   . Bladder stones   . CAD (coronary artery disease)   . Cholelithiasis   . Chronic constipation   . Diabetes mellitus    Type 2  . Dyslipidemia   . Hyperlipidemia   . Hypertension   . Hypothyroidism   . Myocardial infarction ([place]) 09/22/10   1986  . Pneumonia 2012    Past Surgical History:  Procedure Laterality Date  . ABDOMINAL AORTIC ANEURYSM REPAIR  09/15/10   Juxtarenal AAA repair   . APPENDECTOMY    . CORONARY ARTERY BYPASS GRAFT  1986   left leg vein harvest  . IR GENERIC HISTORICAL  03/04/2016   IR ANGIO VERTEBRAL SEL VERTEBRAL BILAT MOD SED 03/04/2016 Luanne Bras, MD [facility]  .  IR GENERIC HISTORICAL  03/04/2016   IR ANGIO INTRA EXTRACRAN SEL COM CAROTID INNOMINATE BILAT MOD SED 03/04/2016 Luanne Bras, MD [facility]  . TONSILLECTOMY         Inpatient Medications: Scheduled Meds: . heparin injection (subcutaneous)  5,000 Units Subcutaneous Q8H  . insulin aspart  0-9 Units Subcutaneous TID WC  . levothyroxine  150 mcg Oral QAC breakfast  . metoprolol tartrate  12.5 mg Oral BID  . pantoprazole (PROTONIX) IV  40 mg Intravenous Q12H  . rosuvastatin  40 mg Oral q1800   Continuous Infusions: . sodium chloride    . sodium chloride    .  sodium chloride    . sodium chloride     PRN Meds: HYDROcodone-acetaminophen, ondansetron **OR** ondansetron (ZOFRAN) IV  Allergies:    Allergies  Allergen Reactions  . Morphine And Related     hallucinations    Social History:   Social History   Socioeconomic History  . Marital status: Married    Spouse name: Not on file  . Number of children: Not on file  . Years of education: Not on file  . Highest education level: Not on file  Social Needs  . Financial resource strain: Not on file  . Food insecurity - worry: Not on file  . Food insecurity - inability: Not on file  . Transportation needs - medical: Not on file  . Transportation needs - non-medical: Not on file  Occupational History  . Not on file  Tobacco Use  . Smoking status: Current Some Day Smoker    Packs/day: 1.00    Types: Cigarettes    Last attempt to quit: 09/10/2010    Years since quitting: 6.4  . Smokeless tobacco: Never Used  Substance and Sexual Activity  . Alcohol use: No  . Drug use: No  . Sexual activity: Not on file  Other Topics Concern  . Not on file  Social History Narrative  . Not on file    Family History:   No premature h/o CAD.  Family History  Problem Relation Age of Onset  . Stroke Father   . Cancer Sister      ROS:  Please see the history of present illness.  ROS  All other ROS reviewed and negative.     Physical Exam/Data:   Vitals:   02/11/17 1130 02/11/17 1200 02/11/17 1230 02/11/17 1544  BP: 115/67 110/73 131/69 116/66  Pulse: (!) 117   (!) 128  Resp: (!) 27 (!) 27 (!) 26 (!) 26  Temp:    99.1 F (37.3 C)  TempSrc:    Oral  SpO2: 95%   98%  Weight:    180 lb (81.6 kg)  Height:    5\' 9"  (1.753 m)    Intake/Output Summary (Last 24 hours) at 02/11/2017 1604 Last data filed at 02/11/2017 1019 Gross per 24 hour  Intake 3200 ml  Output -  Net 3200 ml   Filed Weights   02/11/17 1544  Weight: 180 lb (81.6 kg)   Body mass index is 26.58 kg/m.  General:   Elderly, confused male in no distress HEENT: normal Lymph: no adenopathy Neck: no JVD Endocrine:  No thryomegaly Cardiac:  He refused auscultation/examination Lungs:  He refused auscultation/examination  Abd: No distention  Ext: no edema Musculoskeletal:  No deformities Psych:  Confused    Relevant CV Studies: Echo pending  Laboratory Data:  Chemistry Recent Labs  Lab 02/10/17 1229  NA 130*  K 4.0  CL  97*  CO2 20*  GLUCOSE 162*  BUN 20  CREATININE 1.24  CALCIUM 9.2  GFRNONAA 52*  GFRAA >60  ANIONGAP 13    No results for input(s): PROT, ALBUMIN, AST, ALT, ALKPHOS, BILITOT in the last 168 hours. Hematology Recent Labs  Lab 02/10/17 1229 02/10/17 2319  WBC 14.5* 10.9*  RBC 2.64* 2.31*  HGB 8.4* 7.3*  HCT 25.7* 22.6*  MCV 97.3 97.8  MCH 31.8 31.6  MCHC 32.7 32.3  RDW 23.4* 23.2*  PLT 72* 73*   Cardiac Enzymes Recent Labs  Lab 02/10/17 1829 02/10/17 2320 02/11/17 0649  TROPONINI 1.31* 1.11* 0.48*    Recent Labs  Lab 02/10/17 1245 02/10/17 1538  TROPIPOC 0.70* 0.95*    BNPNo results for input(s): BNP, PROBNP in the last 168 hours.  DDimer No results for input(s): DDIMER in the last 168 hours.  Radiology/Studies:  Dg Chest 2 View  Result Date: 02/10/2017 CLINICAL DATA:  Shortness of breath EXAM: CHEST  2 VIEW COMPARISON:  05/27/2013 FINDINGS: Minimal enlargement of cardiac silhouette post CABG. Atherosclerotic calcification aorta. Mediastinal contours and pulmonary vascularity normal. Emphysematous and bronchitic changes consistent with COPD. No acute infiltrate, pleural effusion or pneumothorax. Osseous demineralization with mild degenerative disc disease changes of the thoracic spine. IMPRESSION: Mild enlargement of cardiac silhouette post CABG. COPD changes without acute infiltrate. Electronically Signed   By: Lavonia Dana M.D.   On: 02/10/2017 12:28   Ct Angio Chest/abd/pel For Dissection W And/or Wo Contrast  Result Date: 02/10/2017 CLINICAL  DATA:  83 year old male with chest abdominal pain with shortness of breath for 3 days. History of abdominal aortic aneurysm repair. EXAM: CT ANGIOGRAPHY CHEST, ABDOMEN AND PELVIS TECHNIQUE: Multidetector CT imaging through the chest, abdomen and pelvis was performed using the standard protocol during bolus administration of intravenous contrast. Multiplanar reconstructed images and MIPs were obtained and reviewed to evaluate the vascular anatomy. CONTRAST:  123mL ISOVUE-370 IOPAMIDOL (ISOVUE-370) INJECTION 76% COMPARISON:  02/10/2017 and prior chest radiographs. 10/13/2011 abdominal/pelvic CT. FINDINGS: CTA CHEST FINDINGS Cardiovascular: Upper limits normal heart size, coronary artery calcifications and CABG changes noted. Thoracic aortic atherosclerotic plaque/calcifications noted without aneurysm or dissection. No pericardial effusion or large/central pulmonary emboli. Mediastinum/Nodes: No enlarged mediastinal, hilar, or axillary lymph nodes. Thyroid gland, trachea, and esophagus demonstrate no significant findings. Lungs/Pleura: An 8 mm nodular opacity within the lingula (7:103) is now identified. No other nodule or mass noted. Trace bilateral pleural effusions and minimal bibasilar atelectasis noted No airspace disease, consolidation or pneumothorax noted Musculoskeletal: No acute abnormality or suspicious bony lesion. Review of the MIP images confirms the above findings. CTA ABDOMEN AND PELVIS FINDINGS VASCULAR The abdominal aorta/graft is unchanged with the aorta again measuring 3.7 cm in greatest diameter. Aortic atherosclerotic plaque/calcifications again noted. There is no evidence of aortic dissection. No change in atherosclerotic calcification within the major branch vessels. 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 Right common iliac scratch de bilateral common iliac artery aneurysms are unchanged measuring 1.6 cm on the right and 1.8 cm on the left. Review of the MIP images  confirms the above findings. NON-VASCULAR Hepatobiliary: Probable hepatic steatosis again noted. Cholelithiasis identified without CT evidence of acute cholecystitis. No biliary dilatation. Pancreas: Unremarkable Spleen: Unremarkable Adrenals/Urinary Tract: An 8 mm calculus at the left UVJ/bladder noted. An 8 mm bladder calculus within the right dependent aspect noted. There is no evidence of hydronephrosis. Bilateral renal cysts are present. The adrenal glands are unremarkable and unchanged. Stomach/Bowel: No evidence of bowel  obstruction, definite bowel wall thickening or bowel inflammatory changes. Colonic diverticulosis noted without evidence of diverticulitis. Lymphatic: No enlarged lymph nodes. Reproductive: Prostate enlargement again noted Other: No ascites, abscess or pneumoperitoneum. Musculoskeletal: No acute abnormality. Degenerative changes within the lumbar spine again noted. Review of the MIP images confirms the above findings. IMPRESSION: 1. No evidence of aortic dissection or central/large pulmonary emboli. 2. Aortic atherosclerotic calcifications with abdominal aortic aneurysm repair, unchanged appearance of abdominal aorta and unchanged bilateral common iliac artery aneurysms. 3. 8 mm lingular nodule. Consider one of the following in 3 months for both low-risk and high-risk individuals: (a) repeat chest CT, (b) follow-up PET-CT, or (c) tissue sampling. This recommendation follows the consensus statement: Guidelines for Management of Incidental Pulmonary Nodules Detected on CT Images: From the Fleischner Society 2017; Radiology 2017; 284:228-243. 4. 8 mm left UVJ versus left bladder calculus. No evidence of hydronephrosis. 8 mm right bladder calculus. 5. Trace bilateral pleural effusions and mild bibasilar atelectasis 6. Probable hepatic steatosis. 7. Cholelithiasis without CT evidence of acute cholecystitis 8. Aortic Atherosclerosis (ICD10-I70.0) and Emphysema (ICD10-J43.9). Electronically Signed    By: Margarette Canada M.D.   On: 02/10/2017 13:58    Assessment and Plan:   1. Chest pain in the context of CAD with 4-vessel CABG 33 years ago: He currently denies chest pain and refuses physical exam. While troponin is elevated, this is likely indicative of demand ischemia in the context of severe anemia. He has been transfused. He is not a candidate for antiplatelet therapy so would avoid ASA and heparin. He is currently on metoprolol and Crestor. After a GI workup is completed (EGD/colonoscopy), I would recommend an outpatient nuclear stress test. Will await results of echocardiogram.  2. Hypertension: BP is normal.  3. AAA s/p repair  4. Hyperlipidemia: Continue statin therapy.  5. Anemia: Needs GI workup (EGD, colonoscopy). Received PRBC transfusion. Aim to keep Hgb > 8 given CABG and troponin elevation. Currently on IV Protonix.    For questions or updates, please contact [place] Please consult [email or web address] for contact info under Cardiology/STEMI.   Signed, Kate Sable, MD  02/11/2017 4:04 PM

## 2017-02-11 NOTE — Progress Notes (Signed)
Sitter initiated at [employer]

## 2017-02-11 NOTE — Progress Notes (Signed)
Blood bank called RN regarding his blood is being ready. Since patient is confused and the situation is not appropriate this time, RN asked them to hold and and will resume when pt becomes little stable

## 2017-02-11 NOTE — Progress Notes (Signed)
Pharmacy Antibiotic Note  Dillon Sweeney is a 84 y.o. male admitted on 02/10/2017 with sepsis.  Pharmacy has been consulted for zosyn dosing.  On day #2 of antibiotics - vancomycin discontinued today. WBC was 14.5 which decreased to 10.9 today. Patient continues to have altered mental status. Tmax 99.1. Discussed with MD who requested continue for tonight given AMS and will re-assess.   Plan: Zosyn 3.375g IV q8h (4 hour infusion).  Monitor renal function, clinical picture, cx results, and LOT  Height: 5\' 9"  (175.3 cm) Weight: 180 lb (81.6 kg)(scale B) IBW/kg (Calculated) : 70.7  Temp (24hrs), Avg:99 F (37.2 C), Min:98.9 F (37.2 C), Max:99.1 F (37.3 C)  Recent Labs  Lab 02/10/17 1229 02/10/17 1300 02/10/17 1517 02/10/17 1829 02/10/17 2319  WBC 14.5*  --   --   --  10.9*  CREATININE 1.24  --   --   --   --   LATICACIDVEN  --  2.74* 2.3* 1.9  --     Estimated Creatinine Clearance: 45.9 mL/min (by C-G formula based on SCr of 1.24 mg/dL).    Allergies  Allergen Reactions  . Morphine And Related     hallucinations    Antimicrobials this admission: Vancomycin 1/11 >> 1/12 Zosyn 1/11 >>   Dose adjustments this admission: N/A  Microbiology results: 1/11 BCx: NGTD 1/11 UCx: sent   Thank you for allowing pharmacy to be a part of this patient's care.  Doylene Canard, PharmD Clinical Pharmacist  Pager: [phone] Clinical Phone for 02/11/2017 until 3:30pm: [phone] If after 3:30pm, please call main pharmacy at [phone] 02/11/2017 4:31 PM

## 2017-02-11 NOTE — ED Notes (Signed)
Date and time results received: 02/11/17 7:52 AM (use smartphrase ".now" to insert current time)  Test: Troponin Critical Value: 0.48  Name of Provider Notified: Jerilee Hoh  Orders Received? Or Actions Taken?: Orders Received - See Orders for details

## 2017-02-12 ENCOUNTER — Other Ambulatory Visit: Payer: Self-pay

## 2017-02-12 ENCOUNTER — Inpatient Hospital Stay (HOSPITAL_COMMUNITY): Payer: Medicare Other

## 2017-02-12 DIAGNOSIS — R1013 Epigastric pain: Secondary | ICD-10-CM

## 2017-02-12 DIAGNOSIS — R062 Wheezing: Secondary | ICD-10-CM

## 2017-02-12 DIAGNOSIS — E119 Type 2 diabetes mellitus without complications: Secondary | ICD-10-CM

## 2017-02-12 DIAGNOSIS — R410 Disorientation, unspecified: Secondary | ICD-10-CM

## 2017-02-12 DIAGNOSIS — I714 Abdominal aortic aneurysm, without rupture: Secondary | ICD-10-CM

## 2017-02-12 DIAGNOSIS — R911 Solitary pulmonary nodule: Secondary | ICD-10-CM

## 2017-02-12 DIAGNOSIS — R6510 Systemic inflammatory response syndrome (SIRS) of non-infectious origin without acute organ dysfunction: Secondary | ICD-10-CM

## 2017-02-12 LAB — CBC
HEMATOCRIT: 24.6 % — AB (ref 39.0–52.0)
Hemoglobin: 8.1 g/dL — ABNORMAL LOW (ref 13.0–17.0)
MCH: 30.8 pg (ref 26.0–34.0)
MCHC: 32.9 g/dL (ref 30.0–36.0)
MCV: 93.5 fL (ref 78.0–100.0)
Platelets: 65 10*3/uL — ABNORMAL LOW (ref 150–400)
RBC: 2.63 MIL/uL — ABNORMAL LOW (ref 4.22–5.81)
RDW: 22.6 % — AB (ref 11.5–15.5)
WBC: 9 10*3/uL (ref 4.0–10.5)

## 2017-02-12 LAB — DIC (DISSEMINATED INTRAVASCULAR COAGULATION) PANEL: APTT: 42 s — AB (ref 24–36)

## 2017-02-12 LAB — GLUCOSE, CAPILLARY
GLUCOSE-CAPILLARY: 137 mg/dL — AB (ref 65–99)
GLUCOSE-CAPILLARY: 199 mg/dL — AB (ref 65–99)
Glucose-Capillary: 138 mg/dL — ABNORMAL HIGH (ref 65–99)
Glucose-Capillary: 90 mg/dL (ref 65–99)

## 2017-02-12 LAB — LACTATE DEHYDROGENASE: LDH: 225 U/L — ABNORMAL HIGH (ref 98–192)

## 2017-02-12 LAB — URINE CULTURE: CULTURE: NO GROWTH

## 2017-02-12 LAB — HEPATIC FUNCTION PANEL
ALT: 15 U/L — AB (ref 17–63)
AST: 34 U/L (ref 15–41)
Albumin: 2.7 g/dL — ABNORMAL LOW (ref 3.5–5.0)
Alkaline Phosphatase: 53 U/L (ref 38–126)
BILIRUBIN DIRECT: 0.4 mg/dL (ref 0.1–0.5)
BILIRUBIN INDIRECT: 1 mg/dL — AB (ref 0.3–0.9)
Total Bilirubin: 1.4 mg/dL — ABNORMAL HIGH (ref 0.3–1.2)
Total Protein: 6 g/dL — ABNORMAL LOW (ref 6.5–8.1)

## 2017-02-12 LAB — DIC (DISSEMINATED INTRAVASCULAR COAGULATION)PANEL
D-Dimer, Quant: 3.22 ug/mL-FEU — ABNORMAL HIGH (ref 0.00–0.50)
Fibrinogen: 591 mg/dL — ABNORMAL HIGH (ref 210–475)
INR: 1.31
Platelets: 58 10*3/uL — ABNORMAL LOW (ref 150–400)
Prothrombin Time: 16.2 seconds — ABNORMAL HIGH (ref 11.4–15.2)

## 2017-02-12 LAB — TSH: TSH: 0.159 u[IU]/mL — ABNORMAL LOW (ref 0.350–4.500)

## 2017-02-12 MED ORDER — PANTOPRAZOLE SODIUM 40 MG PO TBEC
40.0000 mg | DELAYED_RELEASE_TABLET | Freq: Every day | ORAL | Status: DC
  Administered 2017-02-13 – 2017-02-21 (×8): 40 mg via ORAL
  Filled 2017-02-12 (×8): qty 1

## 2017-02-12 MED ORDER — LEVOTHYROXINE SODIUM 25 MCG PO TABS
137.0000 ug | ORAL_TABLET | Freq: Every day | ORAL | Status: DC
  Administered 2017-02-13 – 2017-02-19 (×7): 137 ug via ORAL
  Filled 2017-02-12 (×7): qty 1

## 2017-02-12 MED ORDER — ACETAMINOPHEN 325 MG PO TABS
650.0000 mg | ORAL_TABLET | Freq: Four times a day (QID) | ORAL | Status: DC | PRN
  Administered 2017-02-12 – 2017-02-19 (×7): 650 mg via ORAL
  Filled 2017-02-12 (×7): qty 2

## 2017-02-12 MED ORDER — METOPROLOL TARTRATE 5 MG/5ML IV SOLN
2.5000 mg | Freq: Four times a day (QID) | INTRAVENOUS | Status: DC
  Administered 2017-02-12 – 2017-02-13 (×5): 2.5 mg via INTRAVENOUS
  Filled 2017-02-12 (×5): qty 5

## 2017-02-12 MED ORDER — LEVALBUTEROL HCL 0.63 MG/3ML IN NEBU
0.6300 mg | INHALATION_SOLUTION | Freq: Four times a day (QID) | RESPIRATORY_TRACT | Status: DC | PRN
  Administered 2017-02-12: 0.63 mg via RESPIRATORY_TRACT
  Filled 2017-02-12: qty 3

## 2017-02-12 MED ORDER — MOMETASONE FURO-FORMOTEROL FUM 200-5 MCG/ACT IN AERO
2.0000 | INHALATION_SPRAY | Freq: Two times a day (BID) | RESPIRATORY_TRACT | Status: DC
  Administered 2017-02-12 – 2017-02-19 (×13): 2 via RESPIRATORY_TRACT
  Filled 2017-02-12 (×2): qty 8.8

## 2017-02-12 MED ORDER — LEVALBUTEROL HCL 0.63 MG/3ML IN NEBU
0.6300 mg | INHALATION_SOLUTION | Freq: Four times a day (QID) | RESPIRATORY_TRACT | Status: DC
  Administered 2017-02-12 – 2017-02-14 (×9): 0.63 mg via RESPIRATORY_TRACT
  Filled 2017-02-12 (×8): qty 3

## 2017-02-12 NOTE — Plan of Care (Signed)
  Activity: Risk for activity intolerance will decrease 02/12/2017 0545 - Completed/Met by Evert Kohl, RN   Nutrition: Adequate nutrition will be maintained 02/12/2017 0558 - Completed/Met by Evert Kohl, RN   Elimination: Will not experience complications related to bowel motility 02/12/2017 0558 - Completed/Met by Evert Kohl, RN   Education: Knowledge of General Education information will improve 02/12/2017 [phone] - Completed/Met by Evert Kohl, RN

## 2017-02-12 NOTE — Progress Notes (Addendum)
Pt is more alert and oriented in compared to yesterday, vitals stable, tylenol once given for a complain of headache, IV fluid is running @75cc /hr, safety sitter in a bed side, will reconsider took him off the sitter and see the result or transition him to AVA sitter after consulting with MD but so far patient is following orders and treatments, will continue to monitor  Palma Holter, RN

## 2017-02-12 NOTE — Plan of Care (Signed)
  Activity: Risk for activity intolerance will decrease 02/12/2017 0545 - Completed/Met by Evert Kohl, RN

## 2017-02-12 NOTE — Progress Notes (Addendum)
Pt had 8 beats V-tach at 9.41am. Pt was using restroom, asymptomatic, vitals stable,12 lead EKG done, MD notified  Palma Holter, RN

## 2017-02-12 NOTE — Progress Notes (Signed)
PROGRESS NOTE    Dillon Sweeney  VZC:[id number] DOB: Mar 26, 1932 DOA: 02/10/2017 PCP: Eustaquio Maize, MD   Brief Narrative: Dillon Sweeney is a 84 y.o. man admitted on 1/11 after visiting his PCP for shortness of breath and being referred to the hospital.  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  Also has a history of AAA, diabetes, hypertension, hypothyroidism.  In the ED he also complained of chest pain that he described as pressure-like radiating to both arms. In the ED he was found to meet SIRS criteria and was given broad-spectrum antibiotics.  Because of elevated troponins with a high of 1.31, cardiology was consulted overnight who recommended transfer to [facility] for further evaluation.  However due to lack of bed availability patient remains in the emergency department at [place]:   Principal Problem:   Chest pain Active Problems:   Abdominal aortic aneurysm (HCC)   Diabetes ([place])   HTN (hypertension)   Tobacco use disorder   Myocardial infarction   Lung nodule   Chest pain Resolved. Concern for NSTEMI however troponin trended down. Cardiology consulted and assessment is this is likely related to anemia and type 2 Nstemi. Recommending outpatient stress test as an outpatient.  Anemia Unknown etiology. Patient without history of GI bleeding. Per chart review, patient has had chronic anemia up until 2012. Last hemoglobin prior to admission was about one year prior. History of colonoscopy within a few years with unknown results per patient. S/p 1 unit of PRBC overnight. FOBT negative. -DIC panel, haptoglobin, LDH, hepatic function panel -CBC pending  Wheezing Likely COPD, but patient does not have official diagnosis. Quit smoking three months ago. -Xopenx q 6 hrs -Start Dulera -Will hold off on prednisone for now since patient had delirium symptoms yesterday. If cannot control with SABA,  will start systemic steroids  SIRS Criteria met on admission. No source. Given empiric antibiotics. Blood culture with no growth x2 days. -Discontinue Zosyn and monitor  Lung nodule Incidental. Outpatient follow-up with repeat chest CT in 3 months  Diabetes mellitus -SSI  Delirium Episode yesterday. Responded to haldol. Sitter at bedside. -CT head pending in setting of possible acute anemia. No head trauma history.   DVT prophylaxis: Heparin subq Code Status: Full code Family Communication: None at bedside Disposition Plan: Discharge when medically stable.   Consultants:   Cardiology  Procedures:   Echocardiogram pending  Antimicrobials:  Vancomycin  Zosyn    Subjective: Wheezing today with dyspnea. No chest pain.  Objective: Vitals:   02/12/17 0415 02/12/17 0434 02/12/17 0541 02/12/17 0710  BP: 103/69 (!) 116/56  108/67  Pulse: (!) 107 98  97  Resp: (!) 22 (!) 24  (!) 22  Temp: 97.8 F (36.6 C) 98.7 F (37.1 C)  98.2 F (36.8 C)  TempSrc: Oral Oral  Oral  SpO2: 100% 98%  99%  Weight:   81.9 kg (180 lb 8.9 oz)   Height:        Intake/Output Summary (Last 24 hours) at 02/12/2017 0842 Last data filed at 02/12/2017 0830 Gross per 24 hour  Intake 1460 ml  Output 825 ml  Net 635 ml   Filed Weights   02/11/17 1544 02/12/17 0541  Weight: 81.6 kg (180 lb) 81.9 kg (180 lb 8.9 oz)    Examination:  General exam: Appears calm and comfortable Respiratory system: Wheezing bilaterally with prolonged  expiratory phase and mild supraclavicular retractions. Cardiovascular system: S1 & S2 heard, RRR. No murmurs, rubs. Gastrointestinal system: Abdomen is nondistended, soft and nontender. No organomegaly or masses felt. Normal bowel sounds heard. Central nervous system: Alert and oriented. No focal neurological deficits. Extremities: No edema. No calf tenderness Skin: No cyanosis. No rashes Psychiatry: Judgement and insight appear normal. Mood & affect  appropriate.     Data Reviewed: I have personally reviewed following labs and imaging studies  CBC: Recent Labs  Lab 02/10/17 1229 02/10/17 2319 02/11/17 1548  WBC 14.5* 10.9* 12.5*  NEUTROABS 12.9*  --   --   HGB 8.4* 7.3* 7.8*  HCT 25.7* 22.6* 24.1*  MCV 97.3 97.8 95.6  PLT 72* 73* 76*   Basic Metabolic Panel: Recent Labs  Lab 02/10/17 1229 02/11/17 1548  NA 130* 131*  K 4.0 3.6  CL 97* 101  CO2 20* 18*  GLUCOSE 162* 150*  BUN 20 14  CREATININE 1.24 1.14  CALCIUM 9.2 8.6*   GFR: Estimated Creatinine Clearance: 50 mL/min (by C-G formula based on SCr of 1.14 mg/dL). Liver Function Tests: No results for input(s): AST, ALT, ALKPHOS, BILITOT, PROT, ALBUMIN in the last 168 hours. No results for input(s): LIPASE, AMYLASE in the last 168 hours. No results for input(s): AMMONIA in the last 168 hours. Coagulation Profile: No results for input(s): INR, PROTIME in the last 168 hours. Cardiac Enzymes: Recent Labs  Lab 02/10/17 1829 02/10/17 2320 02/11/17 0649  TROPONINI 1.31* 1.11* 0.48*   BNP (last 3 results) No results for input(s): PROBNP in the last 8760 hours. HbA1C: No results for input(s): HGBA1C in the last 72 hours. CBG: Recent Labs  Lab 02/10/17 2253 02/11/17 0740 02/11/17 1241 02/11/17 2224 02/12/17 0747  GLUCAP 114* 147* 161* 163* 137*   Lipid Profile: No results for input(s): CHOL, HDL, LDLCALC, TRIG, CHOLHDL, LDLDIRECT in the last 72 hours. Thyroid Function Tests: No results for input(s): TSH, T4TOTAL, FREET4, T3FREE, THYROIDAB in the last 72 hours. Anemia Panel: No results for input(s): VITAMINB12, FOLATE, FERRITIN, TIBC, IRON, RETICCTPCT in the last 72 hours. Sepsis Labs: Recent Labs  Lab 02/10/17 1300 02/10/17 1517 02/10/17 1829  LATICACIDVEN 2.74* 2.3* 1.9    Recent Results (from the past 240 hour(s))  Blood culture (routine x 2)     Status: None (Preliminary result)   Collection Time: 02/10/17  3:25 PM  Result Value Ref Range  Status   Specimen Description BLOOD RIGHT FOREARM  Final   Special Requests   Final    BOTTLES DRAWN AEROBIC AND ANAEROBIC Blood Culture adequate volume   Culture NO GROWTH 2 DAYS  Final   Report Status PENDING  Incomplete  Blood culture (routine x 2)     Status: None (Preliminary result)   Collection Time: 02/10/17  3:37 PM  Result Value Ref Range Status   Specimen Description LEFT ANTECUBITAL  Final   Special Requests   Final    BOTTLES DRAWN AEROBIC AND ANAEROBIC Blood Culture adequate volume   Culture NO GROWTH 2 DAYS  Final   Report Status PENDING  Incomplete         Radiology Studies: Dg Chest 2 View  Result Date: 02/10/2017 CLINICAL DATA:  Shortness of breath EXAM: CHEST  2 VIEW COMPARISON:  05/27/2013 FINDINGS: Minimal enlargement of cardiac silhouette post CABG. Atherosclerotic calcification aorta. Mediastinal contours and pulmonary vascularity normal. Emphysematous and bronchitic changes consistent with COPD. No acute infiltrate, pleural effusion or pneumothorax. Osseous demineralization with mild degenerative disc disease  changes of the thoracic spine. IMPRESSION: Mild enlargement of cardiac silhouette post CABG. COPD changes without acute infiltrate. Electronically Signed   By: Lavonia Dana M.D.   On: 02/10/2017 12:28   Ct Angio Chest/abd/pel For Dissection W And/or Wo Contrast  Result Date: 02/10/2017 CLINICAL DATA:  84 year old male with chest abdominal pain with shortness of breath for 3 days. History of abdominal aortic aneurysm repair. EXAM: CT ANGIOGRAPHY CHEST, ABDOMEN AND PELVIS TECHNIQUE: Multidetector CT imaging through the chest, abdomen and pelvis was performed using the standard protocol during bolus administration of intravenous contrast. Multiplanar reconstructed images and MIPs were obtained and reviewed to evaluate the vascular anatomy. CONTRAST:  161m ISOVUE-370 IOPAMIDOL (ISOVUE-370) INJECTION 76% COMPARISON:  02/10/2017 and prior chest radiographs.  10/13/2011 abdominal/pelvic CT. FINDINGS: CTA CHEST FINDINGS Cardiovascular: Upper limits normal heart size, coronary artery calcifications and CABG changes noted. Thoracic aortic atherosclerotic plaque/calcifications noted without aneurysm or dissection. No pericardial effusion or large/central pulmonary emboli. Mediastinum/Nodes: No enlarged mediastinal, hilar, or axillary lymph nodes. Thyroid gland, trachea, and esophagus demonstrate no significant findings. Lungs/Pleura: An 8 mm nodular opacity within the lingula (7:103) is now identified. No other nodule or mass noted. Trace bilateral pleural effusions and minimal bibasilar atelectasis noted No airspace disease, consolidation or pneumothorax noted Musculoskeletal: No acute abnormality or suspicious bony lesion. Review of the MIP images confirms the above findings. CTA ABDOMEN AND PELVIS FINDINGS VASCULAR The abdominal aorta/graft is unchanged with the aorta again measuring 3.7 cm in greatest diameter. Aortic atherosclerotic plaque/calcifications again noted. There is no evidence of aortic dissection. No change in atherosclerotic calcification within the major branch vessels. 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 Right common iliac scratch de bilateral common iliac artery aneurysms are unchanged measuring 1.6 cm on the right and 1.8 cm on the left. Review of the MIP images confirms the above findings. NON-VASCULAR Hepatobiliary: Probable hepatic steatosis again noted. Cholelithiasis identified without CT evidence of acute cholecystitis. No biliary dilatation. Pancreas: Unremarkable Spleen: Unremarkable Adrenals/Urinary Tract: An 8 mm calculus at the left UVJ/bladder noted. An 8 mm bladder calculus within the right dependent aspect noted. There is no evidence of hydronephrosis. Bilateral renal cysts are present. The adrenal glands are unremarkable and unchanged. Stomach/Bowel: No evidence of bowel obstruction, definite bowel wall  thickening or bowel inflammatory changes. Colonic diverticulosis noted without evidence of diverticulitis. Lymphatic: No enlarged lymph nodes. Reproductive: Prostate enlargement again noted Other: No ascites, abscess or pneumoperitoneum. Musculoskeletal: No acute abnormality. Degenerative changes within the lumbar spine again noted. Review of the MIP images confirms the above findings. IMPRESSION: 1. No evidence of aortic dissection or central/large pulmonary emboli. 2. Aortic atherosclerotic calcifications with abdominal aortic aneurysm repair, unchanged appearance of abdominal aorta and unchanged bilateral common iliac artery aneurysms. 3. 8 mm lingular nodule. Consider one of the following in 3 months for both low-risk and high-risk individuals: (a) repeat chest CT, (b) follow-up PET-CT, or (c) tissue sampling. This recommendation follows the consensus statement: Guidelines for Management of Incidental Pulmonary Nodules Detected on CT Images: From the Fleischner Society 2017; Radiology 2017; 284:228-243. 4. 8 mm left UVJ versus left bladder calculus. No evidence of hydronephrosis. 8 mm right bladder calculus. 5. Trace bilateral pleural effusions and mild bibasilar atelectasis 6. Probable hepatic steatosis. 7. Cholelithiasis without CT evidence of acute cholecystitis 8. Aortic Atherosclerosis (ICD10-I70.0) and Emphysema (ICD10-J43.9). Electronically Signed   By: JMargarette CanadaM.D.   On: 02/10/2017 13:58        Scheduled Meds: . heparin  injection (subcutaneous)  5,000 Units Subcutaneous Q8H  . insulin aspart  0-9 Units Subcutaneous TID WC  . levalbuterol  0.63 mg Nebulization Q6H  . levothyroxine  150 mcg Oral QAC breakfast  . metoprolol tartrate  2.5 mg Intravenous Q6H  . pantoprazole (PROTONIX) IV  40 mg Intravenous Q12H  . rosuvastatin  40 mg Oral q1800   Continuous Infusions: . sodium chloride    . sodium chloride 75 mL/hr at 02/12/17 0748  . piperacillin-tazobactam (ZOSYN)  IV 3.375 g  (02/12/17 0715)     LOS: 2 days     Cordelia Poche, MD [facility] 02/12/2017, 8:42 AM Pager: [phone]  If 7PM-7AM, please contact night-coverage [email or web address] Password [facility] 02/12/2017, 8:42 AM

## 2017-02-12 NOTE — Plan of Care (Signed)
  Activity: Risk for activity intolerance will decrease 02/12/2017 0545 - Completed/Met by Evert Kohl, RN   Education: Knowledge of General Education information will improve 02/12/2017 [id number] - Completed/Met by Evert Kohl, RN

## 2017-02-12 NOTE — Progress Notes (Signed)
Pt is refusing to measure urine, refusing to use urinal, so only occurrences has been documented

## 2017-02-13 ENCOUNTER — Inpatient Hospital Stay (HOSPITAL_COMMUNITY): Payer: Medicare Other

## 2017-02-13 ENCOUNTER — Encounter (HOSPITAL_COMMUNITY): Payer: Self-pay | Admitting: Family Medicine

## 2017-02-13 DIAGNOSIS — D649 Anemia, unspecified: Secondary | ICD-10-CM

## 2017-02-13 DIAGNOSIS — I361 Nonrheumatic tricuspid (valve) insufficiency: Secondary | ICD-10-CM

## 2017-02-13 DIAGNOSIS — R0600 Dyspnea, unspecified: Secondary | ICD-10-CM

## 2017-02-13 DIAGNOSIS — R0602 Shortness of breath: Secondary | ICD-10-CM

## 2017-02-13 DIAGNOSIS — R7989 Other specified abnormal findings of blood chemistry: Secondary | ICD-10-CM

## 2017-02-13 DIAGNOSIS — R079 Chest pain, unspecified: Secondary | ICD-10-CM

## 2017-02-13 DIAGNOSIS — D696 Thrombocytopenia, unspecified: Secondary | ICD-10-CM

## 2017-02-13 DIAGNOSIS — Z951 Presence of aortocoronary bypass graft: Secondary | ICD-10-CM

## 2017-02-13 DIAGNOSIS — F17200 Nicotine dependence, unspecified, uncomplicated: Secondary | ICD-10-CM

## 2017-02-13 DIAGNOSIS — R748 Abnormal levels of other serum enzymes: Secondary | ICD-10-CM

## 2017-02-13 DIAGNOSIS — R778 Other specified abnormalities of plasma proteins: Secondary | ICD-10-CM

## 2017-02-13 LAB — CBC
HEMATOCRIT: 25 % — AB (ref 39.0–52.0)
HEMOGLOBIN: 8.2 g/dL — AB (ref 13.0–17.0)
MCH: 30.5 pg (ref 26.0–34.0)
MCHC: 32.8 g/dL (ref 30.0–36.0)
MCV: 92.9 fL (ref 78.0–100.0)
Platelets: 73 10*3/uL — ABNORMAL LOW (ref 150–400)
RBC: 2.69 MIL/uL — ABNORMAL LOW (ref 4.22–5.81)
RDW: 22.7 % — ABNORMAL HIGH (ref 11.5–15.5)
WBC: 10.9 10*3/uL — AB (ref 4.0–10.5)

## 2017-02-13 LAB — TYPE AND SCREEN
ABO/RH(D): A POS
Antibody Screen: NEGATIVE
Unit division: 0

## 2017-02-13 LAB — BPAM RBC
BLOOD PRODUCT EXPIRATION DATE: 201901282359
ISSUE DATE / TIME: 201901130402
UNIT TYPE AND RH: 6200

## 2017-02-13 LAB — IRON AND TIBC
Iron: 24 ug/dL — ABNORMAL LOW (ref 45–182)
Saturation Ratios: 10 % — ABNORMAL LOW (ref 17.9–39.5)
TIBC: 234 ug/dL — ABNORMAL LOW (ref 250–450)
UIBC: 210 ug/dL

## 2017-02-13 LAB — DIRECT ANTIGLOBULIN TEST (NOT AT ARMC)
DAT, COMPLEMENT: NEGATIVE
DAT, IGG: NEGATIVE

## 2017-02-13 LAB — ECHOCARDIOGRAM COMPLETE
Height: 69 in
WEIGHTICAEL: 2966.51 [oz_av]

## 2017-02-13 LAB — BASIC METABOLIC PANEL
ANION GAP: 12 (ref 5–15)
BUN: 18 mg/dL (ref 6–20)
CHLORIDE: 103 mmol/L (ref 101–111)
CO2: 19 mmol/L — AB (ref 22–32)
Calcium: 8.5 mg/dL — ABNORMAL LOW (ref 8.9–10.3)
Creatinine, Ser: 1.34 mg/dL — ABNORMAL HIGH (ref 0.61–1.24)
GFR calc non Af Amer: 48 mL/min — ABNORMAL LOW (ref >60)
GFR, EST AFRICAN AMERICAN: 55 mL/min — AB (ref >60)
Glucose, Bld: 153 mg/dL — ABNORMAL HIGH (ref 65–99)
Potassium: 3.6 mmol/L (ref 3.5–5.1)
Sodium: 134 mmol/L — ABNORMAL LOW (ref 135–145)

## 2017-02-13 LAB — RETICULOCYTES
RBC.: 2.71 MIL/uL — AB (ref 4.22–5.81)
RETIC CT PCT: 1.6 % (ref 0.4–3.1)
Retic Count, Absolute: 43.4 10*3/uL (ref 19.0–186.0)

## 2017-02-13 LAB — HAPTOGLOBIN: HAPTOGLOBIN: 306 mg/dL — AB (ref 34–200)

## 2017-02-13 LAB — GLUCOSE, CAPILLARY
GLUCOSE-CAPILLARY: 154 mg/dL — AB (ref 65–99)
GLUCOSE-CAPILLARY: 159 mg/dL — AB (ref 65–99)
Glucose-Capillary: 123 mg/dL — ABNORMAL HIGH (ref 65–99)
Glucose-Capillary: 127 mg/dL — ABNORMAL HIGH (ref 65–99)

## 2017-02-13 LAB — FOLATE: Folate: 11.2 ng/mL (ref >5.9)

## 2017-02-13 LAB — VITAMIN B12: VITAMIN B 12: 1000 pg/mL — AB (ref 180–914)

## 2017-02-13 LAB — FERRITIN: Ferritin: 630 ng/mL — ABNORMAL HIGH (ref 24–336)

## 2017-02-13 LAB — SAVE SMEAR

## 2017-02-13 MED ORDER — IPRATROPIUM-ALBUTEROL 0.5-2.5 (3) MG/3ML IN SOLN
3.0000 mL | Freq: Once | RESPIRATORY_TRACT | Status: DC
  Filled 2017-02-13: qty 3

## 2017-02-13 MED ORDER — FUROSEMIDE 10 MG/ML IJ SOLN
20.0000 mg | Freq: Once | INTRAMUSCULAR | Status: AC
Start: 2017-02-13 — End: 2017-02-13
  Administered 2017-02-13: 20 mg via INTRAVENOUS
  Filled 2017-02-13: qty 2

## 2017-02-13 MED ORDER — PREDNISONE 20 MG PO TABS
40.0000 mg | ORAL_TABLET | Freq: Every day | ORAL | Status: DC
  Administered 2017-02-13 – 2017-02-15 (×3): 40 mg via ORAL
  Filled 2017-02-13 (×4): qty 2

## 2017-02-13 MED ORDER — METOPROLOL TARTRATE 100 MG PO TABS
100.0000 mg | ORAL_TABLET | Freq: Every day | ORAL | Status: DC
  Administered 2017-02-13 – 2017-02-15 (×3): 100 mg via ORAL
  Filled 2017-02-13 (×3): qty 1

## 2017-02-13 MED ORDER — FUROSEMIDE 10 MG/ML IJ SOLN
40.0000 mg | Freq: Two times a day (BID) | INTRAMUSCULAR | Status: DC
  Administered 2017-02-13 – 2017-02-14 (×3): 40 mg via INTRAVENOUS
  Filled 2017-02-13 (×3): qty 4

## 2017-02-13 NOTE — Progress Notes (Signed)
PROGRESS NOTE    Dillon Sweeney  FMB:[id number] DOB: 22-May-1932 DOA: 02/10/2017 PCP: Dillon Maize, MD   Brief Narrative: Dillon Sweeney is a 84 y.o. man admitted on 1/11 after visiting his PCP for shortness of breath and being referred to the hospital.  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  Also has a history of AAA, diabetes, hypertension, hypothyroidism.  In the ED he also complained of chest pain that he described as pressure-like radiating to both arms. In the ED he was found to meet SIRS criteria and was given broad-spectrum antibiotics.  Because of elevated troponins with a high of 1.31, cardiology was consulted overnight who recommended transfer to [facility] for further evaluation.  However due to lack of bed availability patient remains in the emergency department at [place]:   Principal Problem:   Chest pain Active Problems:   Abdominal aortic aneurysm (HCC)   Diabetes ([place])   HTN (hypertension)   Tobacco use disorder   Myocardial infarction   Lung nodule   Chest pain Resolved. Concern for NSTEMI however troponin trended down. Cardiology consulted and assessment is this is likely related to anemia and type 2 Nstemi. Recommending stress test as an outpatient.  Anemia Thrombocytopenia Unknown etiology. Patient without history of GI bleeding. Per chart review, patient has had chronic anemia up until 2012. Last hemoglobin prior to admission was about one year prior. History of colonoscopy within a few years with unknown results per patient. S/p 1 unit of PRBC since admission. FOBT negative. DIC panel abnormal with elevated PT/PTT, fibrinogen and d-dimer. LDH elevated with slightly elevated total/indirect bilirubin. Unknown if this is chronic. -haptoglobin pending -Hematology consult -Direct coombs  Wheezing Likely COPD, but patient does not have official diagnosis. Quit smoking three  months ago. -Xopenx q 6 hrs -Continue Dulera -Start prednisone  Dyspnea Combination of bronchitis and pulmonary edema. Received lasix overnight with some improvement of symptoms -Cardiology starting scheduled diuresis -Echocardiogram performed but results still pending  Tachycardia Likely rebound from decrease in metoprolol.  -Restart metoprolol -EKG -Cardiology recommendations  SIRS Criteria met on admission. No source. Given empiric antibiotics. Blood culture with no growth x3 days. Urine culture without growth -antibiotics holding  Lung nodule Incidental. Outpatient follow-up with repeat chest CT in 3 months  Diabetes mellitus -SSI  Delirium Appears to have resolved. Unknown etiology. Patient has underlying cognitive impairment per colleague discussion with wife. -CT head pending in setting of possible acute anemia. No head trauma history.   DVT prophylaxis: Heparin subq Code Status: Full code Family Communication: None at bedside Disposition Plan: Discharge when medically stable.   Consultants:   Cardiology  Procedures:   Echocardiogram pending  Antimicrobials:  Vancomycin  Zosyn    Subjective: Dyspnea overnight that improved with lasix. No chest pain or palpitations.  Objective: Vitals:   02/13/17 0200 02/13/17 0300 02/13/17 0600 02/13/17 0822  BP:   125/76 134/73  Pulse:   (!) 124 (!) 128  Resp:   (!) 22   Temp:   98.5 F (36.9 C)   TempSrc:   Oral   SpO2: 96% 97% 99% 97%  Weight:  84.1 kg (185 lb 6.5 oz)    Height:        Intake/Output Summary (Last 24 hours) at 02/13/2017 1000 Last data filed at 02/13/2017 0700 Gross per 24 hour  Intake 892.25 ml  Output  1325 ml  Net -432.75 ml   Filed Weights   02/11/17 1544 02/12/17 0541 02/13/17 0300  Weight: 81.6 kg (180 lb) 81.9 kg (180 lb 8.9 oz) 84.1 kg (185 lb 6.5 oz)    Examination:  General exam: Appears calm and comfortable Respiratory system: Wheezing bilaterally with prolonged  expiratory phase and mild supraclavicular retractions. Cardiovascular system: S1 & S2 heard, tachycardia, regular rhythm. No murmurs, rubs. Gastrointestinal system: Abdomen is nondistended, soft and nontender. No organomegaly or masses felt. Normal bowel sounds heard. Central nervous system: Alert and oriented to person place and time. No focal neurological deficits. Extremities: No edema. No calf tenderness Skin: No cyanosis. No rashes Psychiatry: Judgement and insight appears impaired. Mood & affect appropriate. Patient thought his wife was in the room across from his.    Data Reviewed: I have personally reviewed following labs and imaging studies  CBC: Recent Labs  Lab 02/10/17 1229 02/10/17 2319 02/11/17 1548 02/12/17 0924 02/12/17 1158  WBC 14.5* 10.9* 12.5* 9.0  --   NEUTROABS 12.9*  --   --   --   --   HGB 8.4* 7.3* 7.8* 8.1*  --   HCT 25.7* 22.6* 24.1* 24.6*  --   MCV 97.3 97.8 95.6 93.5  --   PLT 72* 73* 76* 65* 58*   Basic Metabolic Panel: Recent Labs  Lab 02/10/17 1229 02/11/17 1548  NA 130* 131*  K 4.0 3.6  CL 97* 101  CO2 20* 18*  GLUCOSE 162* 150*  BUN 20 14  CREATININE 1.24 1.14  CALCIUM 9.2 8.6*   GFR: Estimated Creatinine Clearance: 50 mL/min (by C-G formula based on SCr of 1.14 mg/dL). Liver Function Tests: Recent Labs  Lab 02/12/17 1158  AST 34  ALT 15*  ALKPHOS 53  BILITOT 1.4*  PROT 6.0*  ALBUMIN 2.7*   No results for input(s): LIPASE, AMYLASE in the last 168 hours. No results for input(s): AMMONIA in the last 168 hours. Coagulation Profile: Recent Labs  Lab 02/12/17 1158  INR 1.31   Cardiac Enzymes: Recent Labs  Lab 02/10/17 1829 02/10/17 2320 02/11/17 0649  TROPONINI 1.31* 1.11* 0.48*   BNP (last 3 results) No results for input(s): PROBNP in the last 8760 hours. HbA1C: No results for input(s): HGBA1C in the last 72 hours. CBG: Recent Labs  Lab 02/12/17 0747 02/12/17 1101 02/12/17 1609 02/12/17 2120 02/13/17 0740    GLUCAP 137* 138* 199* 90 123*   Lipid Profile: No results for input(s): CHOL, HDL, LDLCALC, TRIG, CHOLHDL, LDLDIRECT in the last 72 hours. Thyroid Function Tests: Recent Labs    02/12/17 0924  TSH 0.159*   Anemia Panel: No results for input(s): VITAMINB12, FOLATE, FERRITIN, TIBC, IRON, RETICCTPCT in the last 72 hours. Sepsis Labs: Recent Labs  Lab 02/10/17 1300 02/10/17 1517 02/10/17 1829  LATICACIDVEN 2.74* 2.3* 1.9    Recent Results (from the past 240 hour(s))  Urine culture     Status: None   Collection Time: 02/10/17 12:46 PM  Result Value Ref Range Status   Specimen Description URINE, CATHETERIZED  Final   Special Requests NONE  Final   Culture   Final    NO GROWTH Performed at [facility], [place] [address], [place], [place] [address]    Report Status 02/12/2017 FINAL  Final  Blood culture (routine x 2)     Status: None (Preliminary result)   Collection Time: 02/10/17  3:25 PM  Result Value Ref Range Status   Specimen Description BLOOD RIGHT FOREARM  Final   Special Requests   Final    BOTTLES DRAWN AEROBIC AND ANAEROBIC Blood Culture adequate volume   Culture NO GROWTH 3 DAYS  Final   Report Status PENDING  Incomplete  Blood culture (routine x 2)     Status: None (Preliminary result)   Collection Time: 02/10/17  3:37 PM  Result Value Ref Range Status   Specimen Description LEFT ANTECUBITAL  Final   Special Requests   Final    BOTTLES DRAWN AEROBIC AND ANAEROBIC Blood Culture adequate volume   Culture NO GROWTH 3 DAYS  Final   Report Status PENDING  Incomplete         Radiology Studies: Ct Head Wo Contrast  Result Date: 02/12/2017 CLINICAL DATA:  Altered mental status. EXAM: CT HEAD WITHOUT CONTRAST TECHNIQUE: Contiguous axial images were obtained from the base of the skull through the vertex without intravenous contrast. COMPARISON:  CT head without contrast 01/30/2016 FINDINGS: Brain: Moderate atrophy and white matter changes are similar to the  prior exam. No acute infarct, hemorrhage, or mass lesion is present. Ventricles are proportionate to the degree of atrophy. Basal ganglia are intact. Insular ribbon is normal bilaterally. Brainstem and cerebellum are stable. Remote lacunar infarcts are present in the cerebellum. Vascular: Vascular calcifications are present in the left vertebral artery and bilateral cavernous internal carotid arteries. There is no hyperdense vessel. Skull: Calvarium is intact. No focal lytic or blastic lesions are present. Sinuses/Orbits: The paranasal sinuses and mastoid air cells are clear. Bilateral lens replacements are present. Globes and orbits are within normal limits. IMPRESSION: 1. Stable moderate atrophy and white matter disease. This likely reflects the sequela of chronic microvascular ischemia. 2. No acute intracranial abnormality or significant interval change. Electronically Signed   By: San Morelle M.D.   On: 02/12/2017 11:55   Dg Chest Port 1 View  Result Date: 02/13/2017 CLINICAL DATA:  Dyspnea EXAM: PORTABLE CHEST 1 VIEW COMPARISON:  02/10/2017 chest radiograph. FINDINGS: Stable configuration of sternotomy wires and CABG clips. Stable cardiomediastinal silhouette with normal heart size. No pneumothorax. New small bilateral pleural effusions. No pulmonary edema. Hazy bibasilar lung opacities. IMPRESSION: 1. New small bilateral pleural effusions. 2. Hazy bibasilar lung opacities, favor atelectasis, cannot exclude aspiration or developing pneumonia. Electronically Signed   By: Ilona Sorrel M.D.   On: 02/13/2017 01:40        Scheduled Meds: . insulin aspart  0-9 Units Subcutaneous TID WC  . ipratropium-albuterol  3 mL Nebulization Once  . levalbuterol  0.63 mg Nebulization Q6H  . levothyroxine  137 mcg Oral QAC breakfast  . metoprolol tartrate  100 mg Oral Daily  . mometasone-formoterol  2 puff Inhalation BID  . pantoprazole  40 mg Oral Daily  . rosuvastatin  40 mg Oral q1800   Continuous  Infusions: . sodium chloride       LOS: 3 days     Cordelia Poche, MD [facility] 02/13/2017, 10:00 AM Pager: [phone]  If 7PM-7AM, please contact night-coverage [email or web address] Password [email or web address] 02/13/2017, 10:00 AM

## 2017-02-13 NOTE — Consult Note (Signed)
[place] CONSULT NOTE  Patient Care Team: Eustaquio Maize, MD as PCP - General (Pediatrics)  CHIEF COMPLAINTS/PURPOSE OF CONSULTATION:  Severe anemia and thrombocytopenia  HISTORY OF PRESENTING ILLNESS:  Dillon Sweeney 84 y.o. male  with a past medical history of CABG in 1986 who presented with a pressure-like chest pain and was admitted to the hospital for acute coronary syndrome.  Because of elevation of troponin.  He was transferred to [facility]  At the time of admission, patient was noted to have severe anemia and thrombocytopenia.  He was given 1 unit of blood transfusion.  He was also noted to have a low-grade temperature with tachycardia and leukocytosis suggestive of possible sepsis.  He was treated with vancomycin and Zosyn.  His platelet counts have fluctuated between 76 and 58.  Today they were 73.  His blood smear and labs did not reveal any evidence of hemolysis.  Direct Coombs test was negative.  DIC panel was also negative.  His fibrinogen was 591. The patient was sleeping soundly and was very hard to arouse.  He did open his eyes but he did not acknowledge me.  He was not able to answer any questions.  There was no family in the room.  MEDICAL HISTORY:  Past Medical History:  Diagnosis Date  . AAA (abdominal aortic aneurysm) ([place])   . Atherosclerosis   . Bladder stones   . CAD (coronary artery disease)   . Cholelithiasis   . Chronic constipation   . Diabetes mellitus    Type 2  . Dyslipidemia   . Hyperlipidemia   . Hypertension   . Hypothyroidism   . Myocardial infarction ([place]) 09/22/10   1986  . Pneumonia 2012    SURGICAL HISTORY: Past Surgical History:  Procedure Laterality Date  . ABDOMINAL AORTIC ANEURYSM REPAIR  09/15/10   Juxtarenal AAA repair   . APPENDECTOMY    . CORONARY ARTERY BYPASS GRAFT  1986   left leg vein harvest  . IR GENERIC HISTORICAL  03/04/2016   IR ANGIO VERTEBRAL SEL VERTEBRAL BILAT MOD SED 03/04/2016 Luanne Bras,  MD [facility]INTER[facility]  . IR GENERIC HISTORICAL  03/04/2016   IR ANGIO INTRA EXTRACRAN SEL COM CAROTID INNOMINATE BILAT MOD SED 03/04/2016 Luanne Bras, MD [facility]  . TONSILLECTOMY      SOCIAL HISTORY: Social History   Socioeconomic History  . Marital status: Married    Spouse name: Not on file  . Number of children: Not on file  . Years of education: Not on file  . Highest education level: Not on file  Social Needs  . Financial resource strain: Not on file  . Food insecurity - worry: Not on file  . Food insecurity - inability: Not on file  . Transportation needs - medical: Not on file  . Transportation needs - non-medical: Not on file  Occupational History  . Not on file  Tobacco Use  . Smoking status: Former Smoker    Packs/day: 1.00    Types: Cigarettes    Last attempt to quit: 10/01/2016    Years since quitting: 0.3  . Smokeless tobacco: Never Used  Substance and Sexual Activity  . Alcohol use: No  . Drug use: No  . Sexual activity: Not on file  Other Topics Concern  . Not on file  Social History Narrative  . Not on file    FAMILY HISTORY: Family History  Problem Relation Age of Onset  . Stroke Father   .  Cancer Sister     ALLERGIES:  is allergic to morphine and related.  MEDICATIONS:  Current Facility-Administered Medications  Medication Dose Route Frequency Provider Last Rate Last Dose  . 0.9 %  sodium chloride infusion   Intravenous Once Isaac Bliss, Rayford Halsted, MD      . acetaminophen (TYLENOL) tablet 650 mg  650 mg Oral Q6H PRN Mariel Aloe, MD   650 mg at 02/12/17 1506  . furosemide (LASIX) injection 40 mg  40 mg Intravenous Q12H Fay Records, MD   40 mg at 02/13/17 1016  . haloperidol lactate (HALDOL) injection 2 mg  2 mg Intravenous Q6H PRN Mariel Aloe, MD   2 mg at 02/12/17 0419  . insulin aspart (novoLOG) injection 0-9 Units  0-9 Units Subcutaneous TID WC Emokpae, Ejiroghene E, MD   2 Units at 02/13/17 1705  . ipratropium-albuterol  (DUONEB) 0.5-2.5 (3) MG/3ML nebulizer solution 3 mL  3 mL Nebulization Once Bodenheimer, Charles A, NP      . levalbuterol (XOPENEX) nebulizer solution 0.63 mg  0.63 mg Nebulization Q6H Mariel Aloe, MD   0.63 mg at 02/13/17 1355  . levothyroxine (SYNTHROID, LEVOTHROID) tablet 137 mcg  137 mcg Oral QAC breakfast Mariel Aloe, MD   137 mcg at 02/13/17 0546  . metoprolol tartrate (LOPRESSOR) tablet 100 mg  100 mg Oral Daily Mariel Aloe, MD   100 mg at 02/13/17 0824  . mometasone-formoterol (DULERA) 200-5 MCG/ACT inhaler 2 puff  2 puff Inhalation BID Mariel Aloe, MD   2 puff at 02/13/17 1020  . ondansetron (ZOFRAN) tablet 4 mg  4 mg Oral Q6H PRN Emokpae, Ejiroghene E, MD       Or  . ondansetron (ZOFRAN) injection 4 mg  4 mg Intravenous Q6H PRN Emokpae, Ejiroghene E, MD      . pantoprazole (PROTONIX) EC tablet 40 mg  40 mg Oral Daily Mariel Aloe, MD   40 mg at 02/13/17 0825  . predniSONE (DELTASONE) tablet 40 mg  40 mg Oral Q breakfast Mariel Aloe, MD   40 mg at 02/13/17 1016  . rosuvastatin (CRESTOR) tablet 40 mg  40 mg Oral q1800 Emokpae, Ejiroghene E, MD   40 mg at 02/13/17 1705    REVIEW OF SYSTEMS:   Not obtainable because the patient is not answering questions  PHYSICAL EXAMINATION: ECOG PERFORMANCE STATUS: 3 - Symptomatic, >50% confined to bed  Vitals:   02/13/17 1021 02/13/17 1355  BP:    Pulse: 92   Resp: 20   Temp:    SpO2: 94% 96%   Filed Weights   02/11/17 1544 02/12/17 0541 02/13/17 0300  Weight: 180 lb (81.6 kg) 180 lb 8.9 oz (81.9 kg) 185 lb 6.5 oz (84.1 kg)    GENERAL:alert, no distress and comfortable SKIN: skin color, texture, turgor are normal, no rashes or significant lesions OROPHARYNX: Dry mouth NECK: supple, thyroid normal size, non-tender, without nodularity LUNGS: Diminished breath sounds at the bases HEART: regular rate & rhythm and no murmurs  ABDOMEN:abdomen soft, non-tender and normal bowel sounds, no active splenomegaly  PSYCH:  Did not evaluate NEURO: Did not evaluate   LABORATORY DATA:  I have reviewed the data as listed Lab Results  Component Value Date   WBC 10.9 (H) 02/13/2017   HGB 8.2 (L) 02/13/2017   HCT 25.0 (L) 02/13/2017   MCV 92.9 02/13/2017   PLT 73 (L) 02/13/2017   Lab Results  Component Value Date   NA  134 (L) 02/13/2017   K 3.6 02/13/2017   CL 103 02/13/2017   CO2 19 (L) 02/13/2017    RADIOGRAPHIC STUDIES: I have personally reviewed the radiological reports and agreed with the findings in the report.  ASSESSMENT AND PLAN:  1.  Severe normocytic anemia: Hemoglobin 8.2 I do not see any results of iron P38 or folic acid levels.  There is clearly no evidence of hemolysis.  I would also like to obtain a reticulocyte count to see if he is producing blood adequately. It most likely could be related to the current stressful hospitalization related bone marrow suppression.  2. Thrombocytopenia: Relatively stable.  There were occasional schistocytes but nothing to be concerned for TTP.  Since there is no evidence of DIC, it could be either bone marrow suppression or autoimmune thrombocytopenia. Either way there is no indication to treat at this time.  We will workup the anemia and figure out if there is anything we can do to improve it.   For the time being continue with supportive care with blood transfusions as needed.   All questions were answered. The patient knows to call the clinic with any problems, questions or concerns.    Harriette Ohara, MD @T @

## 2017-02-13 NOTE — Progress Notes (Signed)
Progress Note  Patient Name: Dillon Sweeney Date of Encounter: 02/13/2017  Primary Cardiologist: No primary care provider on file.   Subjective   No CP  Breathing is OK  Inpatient Medications    Scheduled Meds: . insulin aspart  0-9 Units Subcutaneous TID WC  . ipratropium-albuterol  3 mL Nebulization Once  . levalbuterol  0.63 mg Nebulization Q6H  . levothyroxine  137 mcg Oral QAC breakfast  . metoprolol tartrate  100 mg Oral Daily  . mometasone-formoterol  2 puff Inhalation BID  . pantoprazole  40 mg Oral Daily  . rosuvastatin  40 mg Oral q1800   Continuous Infusions: . sodium chloride     PRN Meds: acetaminophen, haloperidol lactate, ondansetron **OR** ondansetron (ZOFRAN) IV   Vital Signs    Vitals:   02/13/17 0200 02/13/17 0300 02/13/17 0600 02/13/17 0822  BP:   125/76 134/73  Pulse:   (!) 124 (!) 128  Resp:   (!) 22   Temp:   98.5 F (36.9 C)   TempSrc:   Oral   SpO2: 96% 97% 99% 97%  Weight:  185 lb 6.5 oz (84.1 kg)    Height:        Intake/Output Summary (Last 24 hours) at 02/13/2017 0957 Last data filed at 02/13/2017 0700 Gross per 24 hour  Intake 892.25 ml  Output 1325 ml  Net -432.75 ml   Filed Weights   02/11/17 1544 02/12/17 0541 02/13/17 0300  Weight: 180 lb (81.6 kg) 180 lb 8.9 oz (81.9 kg) 185 lb 6.5 oz (84.1 kg)    Telemetry    ?SR with ST vs atrial flutter  EKG pending   - Personally Reviewed  ECG      Physical Exam   GEN: No acute distress.   Neck: JVP is incfreased   Cardiac: RRR, no murmurs, rubs, or gallops.  Respiratory: Bilateral wheezes   GI: Soft, nontender, non-distended  MS: 1+ edema; No deformity. Neuro:  Nonfocal  Psych: Normal affect   Labs    Chemistry Recent Labs  Lab 02/10/17 1229 02/11/17 1548 02/12/17 1158  NA 130* 131*  --   K 4.0 3.6  --   CL 97* 101  --   CO2 20* 18*  --   GLUCOSE 162* 150*  --   BUN 20 14  --   CREATININE 1.24 1.14  --   CALCIUM 9.2 8.6*  --   PROT  --   --  6.0*    ALBUMIN  --   --  2.7*  AST  --   --  34  ALT  --   --  15*  ALKPHOS  --   --  53  BILITOT  --   --  1.4*  GFRNONAA 52* 58*  --   GFRAA >60 >60  --   ANIONGAP 13 12  --      Hematology Recent Labs  Lab 02/10/17 2319 02/11/17 1548 02/12/17 0924 02/12/17 1158  WBC 10.9* 12.5* 9.0  --   RBC 2.31* 2.52* 2.63*  --   HGB 7.3* 7.8* 8.1*  --   HCT 22.6* 24.1* 24.6*  --   MCV 97.8 95.6 93.5  --   MCH 31.6 31.0 30.8  --   MCHC 32.3 32.4 32.9  --   RDW 23.2* 23.3* 22.6*  --   PLT 73* 76* 65* 58*    Cardiac Enzymes Recent Labs  Lab 02/10/17 1829 02/10/17 2320 02/11/17 0649  TROPONINI 1.31* 1.11* 0.48*  Recent Labs  Lab 02/10/17 1245 02/10/17 1538  TROPIPOC 0.70* 0.95*     BNPNo results for input(s): BNP, PROBNP in the last 168 hours.   DDimer  Recent Labs  Lab 02/12/17 1158  DDIMER 3.22*     Radiology    Ct Head Wo Contrast  Result Date: 02/12/2017 CLINICAL DATA:  Altered mental status. EXAM: CT HEAD WITHOUT CONTRAST TECHNIQUE: Contiguous axial images were obtained from the base of the skull through the vertex without intravenous contrast. COMPARISON:  CT head without contrast 01/30/2016 FINDINGS: Brain: Moderate atrophy and white matter changes are similar to the prior exam. No acute infarct, hemorrhage, or mass lesion is present. Ventricles are proportionate to the degree of atrophy. Basal ganglia are intact. Insular ribbon is normal bilaterally. Brainstem and cerebellum are stable. Remote lacunar infarcts are present in the cerebellum. Vascular: Vascular calcifications are present in the left vertebral artery and bilateral cavernous internal carotid arteries. There is no hyperdense vessel. Skull: Calvarium is intact. No focal lytic or blastic lesions are present. Sinuses/Orbits: The paranasal sinuses and mastoid air cells are clear. Bilateral lens replacements are present. Globes and orbits are within normal limits. IMPRESSION: 1. Stable moderate atrophy and white  matter disease. This likely reflects the sequela of chronic microvascular ischemia. 2. No acute intracranial abnormality or significant interval change. Electronically Signed   By: San Morelle M.D.   On: 02/12/2017 11:55   Dg Chest Port 1 View  Result Date: 02/13/2017 CLINICAL DATA:  Dyspnea EXAM: PORTABLE CHEST 1 VIEW COMPARISON:  02/10/2017 chest radiograph. FINDINGS: Stable configuration of sternotomy wires and CABG clips. Stable cardiomediastinal silhouette with normal heart size. No pneumothorax. New small bilateral pleural effusions. No pulmonary edema. Hazy bibasilar lung opacities. IMPRESSION: 1. New small bilateral pleural effusions. 2. Hazy bibasilar lung opacities, favor atelectasis, cannot exclude aspiration or developing pneumonia. Electronically Signed   By: Ilona Sorrel M.D.   On: 02/13/2017 01:40    Cardiac Studies   Echo pending   Patient Profile     84 y.o. male  With history of CAD (s/p CABG in 68), AAA (s/p repair), DM, HTN   Presented to [facility] with CP and SOB  Trop elevated 1.3  Hgb 8.4 decreased to 7  Assessment & Plan   1  Chest pain.  NSTEMI  Some is demand related in setting of CAD and severe anemia  Pt currently CP free  Further eval depends on anemia and can be dlone later as outpt   Echo is pending    2  Anemia  S/p Transfusion Hgb 8.1 Would give lasix to get rid of fluid Strit I/O  Pt 3 L positive , with edema  3  HTN  BP controlled    4  HL  COntinue statin   5  Tachycardia  Get EKG  Looks like pt may be in atrial flutter vs ST  No documentation in pas t     For questions or updates, please contact [facility] Please consult [email or web address] for contact info under Cardiology/STEMI.      Signed, Dorris Carnes, MD  02/13/2017, 9:57 AM

## 2017-02-14 DIAGNOSIS — I5023 Acute on chronic systolic (congestive) heart failure: Secondary | ICD-10-CM

## 2017-02-14 LAB — BASIC METABOLIC PANEL
Anion gap: 15 (ref 5–15)
BUN: 25 mg/dL — ABNORMAL HIGH (ref 6–20)
CO2: 22 mmol/L (ref 22–32)
Calcium: 8.7 mg/dL — ABNORMAL LOW (ref 8.9–10.3)
Chloride: 99 mmol/L — ABNORMAL LOW (ref 101–111)
Creatinine, Ser: 1.46 mg/dL — ABNORMAL HIGH (ref 0.61–1.24)
GFR calc Af Amer: 50 mL/min — ABNORMAL LOW (ref >60)
GFR calc non Af Amer: 43 mL/min — ABNORMAL LOW (ref >60)
Glucose, Bld: 106 mg/dL — ABNORMAL HIGH (ref 65–99)
Potassium: 2.9 mmol/L — ABNORMAL LOW (ref 3.5–5.1)
Sodium: 136 mmol/L (ref 135–145)

## 2017-02-14 LAB — GLUCOSE, CAPILLARY
GLUCOSE-CAPILLARY: 148 mg/dL — AB (ref 65–99)
GLUCOSE-CAPILLARY: 152 mg/dL — AB (ref 65–99)
GLUCOSE-CAPILLARY: 158 mg/dL — AB (ref 65–99)
Glucose-Capillary: 108 mg/dL — ABNORMAL HIGH (ref 65–99)

## 2017-02-14 LAB — MRSA PCR SCREENING: MRSA by PCR: NEGATIVE

## 2017-02-14 LAB — CBC
HEMATOCRIT: 24.9 % — AB (ref 39.0–52.0)
HEMOGLOBIN: 8.2 g/dL — AB (ref 13.0–17.0)
MCH: 31.1 pg (ref 26.0–34.0)
MCHC: 32.9 g/dL (ref 30.0–36.0)
MCV: 94.3 fL (ref 78.0–100.0)
Platelets: 68 10*3/uL — ABNORMAL LOW (ref 150–400)
RBC: 2.64 MIL/uL — ABNORMAL LOW (ref 4.22–5.81)
RDW: 22.6 % — ABNORMAL HIGH (ref 11.5–15.5)
WBC: 8.8 10*3/uL (ref 4.0–10.5)

## 2017-02-14 LAB — MAGNESIUM: Magnesium: 2.1 mg/dL (ref 1.7–2.4)

## 2017-02-14 MED ORDER — IPRATROPIUM-ALBUTEROL 0.5-2.5 (3) MG/3ML IN SOLN: 3.0000 mL | Freq: Four times a day (QID) | RESPIRATORY_TRACT | Status: DC

## 2017-02-14 MED ORDER — POTASSIUM CHLORIDE CRYS ER 20 MEQ PO TBCR
40.0000 meq | EXTENDED_RELEASE_TABLET | ORAL | Status: AC
  Administered 2017-02-14 (×2): 40 meq via ORAL
  Filled 2017-02-14 (×2): qty 2

## 2017-02-14 MED ORDER — GUAIFENESIN-DM 100-10 MG/5ML PO SYRP
5.0000 mL | ORAL_SOLUTION | ORAL | Status: DC | PRN
  Administered 2017-02-14: 5 mL via ORAL
  Filled 2017-02-14: qty 5

## 2017-02-14 MED ORDER — LEVALBUTEROL HCL 0.63 MG/3ML IN NEBU
0.6300 mg | INHALATION_SOLUTION | Freq: Three times a day (TID) | RESPIRATORY_TRACT | Status: DC
  Administered 2017-02-15 – 2017-02-19 (×13): 0.63 mg via RESPIRATORY_TRACT
  Filled 2017-02-14 (×13): qty 3

## 2017-02-14 MED ORDER — AMOXICILLIN-POT CLAVULANATE 875-125 MG PO TABS
1.0000 | ORAL_TABLET | Freq: Two times a day (BID) | ORAL | Status: DC
  Administered 2017-02-14 – 2017-02-18 (×9): 1 via ORAL
  Filled 2017-02-14 (×10): qty 1

## 2017-02-14 NOTE — Progress Notes (Addendum)
PROGRESS NOTE    Dillon Sweeney  IWL:[id number] DOB: 1932-11-14 DOA: 02/10/2017 PCP: Eustaquio Maize, MD   Brief Narrative: Dillon Sweeney is a 84 y.o. man admitted on 1/11 after visiting his PCP for shortness of breath and being referred to the hospital.  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  Also has a history of AAA, diabetes, hypertension, hypothyroidism.  In the ED he also complained of chest pain that he described as pressure-like radiating to both arms. In the ED he was found to meet SIRS criteria and was given broad-spectrum antibiotics.  Because of elevated troponins with a high of 1.31, cardiology was consulted overnight who recommended transfer to [facility] for further evaluation.  However due to lack of bed availability patient remains in the emergency department at [facility]:   Principal Problem:   Chest pain Active Problems:   Abdominal aortic aneurysm ([facility])   Diabetes ([place])   HTN (hypertension)   Tobacco use disorder   Myocardial infarction   Lung nodule   Shortness of breath   Troponin level elevated   Thrombocytopenia ([facility])   Normocytic anemia   Chest pain Resolved. Concern for NSTEMI however troponin trended down. Cardiology consulted and assessment is this is likely related to anemia and type 2 Nstemi. Recommending stress test as an outpatient.  Anemia Thrombocytopenia Unknown etiology. Patient without history of GI bleeding. Per chart review, patient has had chronic anemia up until 2012. Last hemoglobin prior to admission was about one year prior. History of colonoscopy within a few years with unknown results per patient. S/p 1 unit of PRBC since admission. FOBT negative. DIC panel abnormal with elevated PT/PTT, fibrinogen and d-dimer. LDH elevated with slightly elevated total/indirect bilirubin. Unknown if this is chronic. Haptoglobin elevated. Direct coombs negative.  Hematology recommending supportive care -Hematology recommendations -AM CBC  Wheezing Likely COPD, but patient does not have official diagnosis. Quit smoking three months ago. -Xopenx q 6 hrs -Continue Dulera -Continue prednisone  Dyspnea Combination of bronchitis and pulmonary edema. Resolved. -Cardiology starting scheduled diuresis  Acute systolic heart failure Unsure if this is chronic. EF of 35-40% with diffuse hypokinesis on echocardiogram from 1/14. Patient presented with concern for Nstemi. -Cardiology recommendations  Acute kidney injury Likely secondary to diuresis. -Recommend to hold lasix -AM BMP  Tachycardia Likely rebound from decrease in metoprolol. EKG suggests atrial fibrillation. Rate better controlled. -Continue metoprolol -Cardiology recommendations  SIRS Criteria met on admission. No source initially but now with evidence of aspiration pneumonia. Given empiric antibiotics. Blood culture with no growth x3 days. Urine culture without growth. Chest x-ray suggesting pneumonia. -Augmentin -SLP  Aspiration pneumonia Suggested on chest x-ray. -Start Augmentin  Lung nodule Incidental. Outpatient follow-up with repeat chest CT in 3 months  Diabetes mellitus -SSI  Delirium Appears to have resolved. Unknown etiology. Patient has underlying cognitive impairment per colleague discussion with wife. CT head negative for hematoma. Appears to have resolved.  Hypothyroidism Low TSH. -Decreased Synthroid dose to 137 mcg daily   DVT prophylaxis: Heparin subq Code Status: Full code Family Communication: None at bedside Disposition Plan: Discharge when medically stable.   Consultants:   Cardiology  Procedures:   Echocardiogram pending  Antimicrobials:  Vancomycin  Zosyn    Subjective: Dyspnea overnight that improved with lasix. No chest pain or palpitations.  Objective: Vitals:   02/14/17 0200 02/14/17 0500 02/14/17 0759 02/14/17  0841  BP:   (!) 120/53 (!) 133/50   Pulse:  90 87 91  Resp:  18  18  Temp:  98.4 F (36.9 C)    TempSrc:  Oral    SpO2: 96% 92% 99% 95%  Weight:  83.4 kg (183 lb 13.8 oz)    Height:        Intake/Output Summary (Last 24 hours) at 02/14/2017 1123 Last data filed at 02/14/2017 0916 Gross per 24 hour  Intake 240 ml  Output 1750 ml  Net -1510 ml   Filed Weights   02/12/17 0541 02/13/17 0300 02/14/17 0500  Weight: 81.9 kg (180 lb 8.9 oz) 84.1 kg (185 lb 6.5 oz) 83.4 kg (183 lb 13.8 oz)    Examination:  General exam: Appears calm and comfortable Respiratory system: Clear to auscultation bilaterally today with prolonged expiratory phase and mild supraclavicular retractions. Cardiovascular system: S1 & S2 heard, regular rate, regular rhythm. No murmurs, rubs. Gastrointestinal system: Abdomen is nondistended, soft and nontender. No organomegaly or masses felt. Normal bowel sounds heard. Central nervous system: Alert and oriented to person place and time. No focal neurological deficits. Extremities: No edema. No calf tenderness Skin: No cyanosis. No rashes Psychiatry: Mood & affect appropriate.     Data Reviewed: I have personally reviewed following labs and imaging studies  CBC: Recent Labs  Lab 02/10/17 1229 02/10/17 2319 02/11/17 1548 02/12/17 0924 02/12/17 1158 02/13/17 0941 02/14/17 0607  WBC 14.5* 10.9* 12.5* 9.0  --  10.9* 8.8  NEUTROABS 12.9*  --   --   --   --   --   --   HGB 8.4* 7.3* 7.8* 8.1*  --  8.2* 8.2*  HCT 25.7* 22.6* 24.1* 24.6*  --  25.0* 24.9*  MCV 97.3 97.8 95.6 93.5  --  92.9 94.3  PLT 72* 73* 76* 65* 58* 73* 68*   Basic Metabolic Panel: Recent Labs  Lab 02/10/17 1229 02/11/17 1548 02/13/17 0941 02/14/17 0607  NA 130* 131* 134* 136  K 4.0 3.6 3.6 2.9*  CL 97* 101 103 99*  CO2 20* 18* 19* 22  GLUCOSE 162* 150* 153* 106*  BUN '20 14 18 '$ 25*  CREATININE 1.24 1.14 1.34* 1.46*  CALCIUM 9.2 8.6* 8.5* 8.7*  MG  --   --   --  2.1   GFR: Estimated  Creatinine Clearance: 39 mL/min (A) (by C-G formula based on SCr of 1.46 mg/dL (H)). Liver Function Tests: Recent Labs  Lab 02/12/17 1158  AST 34  ALT 15*  ALKPHOS 53  BILITOT 1.4*  PROT 6.0*  ALBUMIN 2.7*   No results for input(s): LIPASE, AMYLASE in the last 168 hours. No results for input(s): AMMONIA in the last 168 hours. Coagulation Profile: Recent Labs  Lab 02/12/17 1158  INR 1.31   Cardiac Enzymes: Recent Labs  Lab 02/10/17 1829 02/10/17 2320 02/11/17 0649  TROPONINI 1.31* 1.11* 0.48*   BNP (last 3 results) No results for input(s): PROBNP in the last 8760 hours. HbA1C: No results for input(s): HGBA1C in the last 72 hours. CBG: Recent Labs  Lab 02/13/17 0740 02/13/17 1112 02/13/17 1606 02/13/17 2103 02/14/17 0758  GLUCAP 123* 154* 159* 127* 108*   Lipid Profile: No results for input(s): CHOL, HDL, LDLCALC, TRIG, CHOLHDL, LDLDIRECT in the last 72 hours. Thyroid Function Tests: Recent Labs    02/12/17 0924  TSH 0.159*   Anemia Panel: Recent Labs    02/13/17 1852  VITAMINB12 1,000*  FOLATE 11.2  FERRITIN 630*  TIBC 234*  IRON 24*  RETICCTPCT 1.6   Sepsis Labs: Recent Labs  Lab 02/10/17 1300 02/10/17 1517 02/10/17 1829  LATICACIDVEN 2.74* 2.3* 1.9    Recent Results (from the past 240 hour(s))  Urine culture     Status: None   Collection Time: 02/10/17 12:46 PM  Result Value Ref Range Status   Specimen Description URINE, CATHETERIZED  Final   Special Requests NONE  Final   Culture   Final    NO GROWTH Performed at [facility], [place] [address], [place], [place] [address]    Report Status 02/12/2017 FINAL  Final  Blood culture (routine x 2)     Status: None (Preliminary result)   Collection Time: 02/10/17  3:25 PM  Result Value Ref Range Status   Specimen Description BLOOD RIGHT FOREARM  Final   Special Requests   Final    BOTTLES DRAWN AEROBIC AND ANAEROBIC Blood Culture adequate volume   Culture NO GROWTH 4 DAYS  Final    Report Status PENDING  Incomplete  Blood culture (routine x 2)     Status: None (Preliminary result)   Collection Time: 02/10/17  3:37 PM  Result Value Ref Range Status   Specimen Description LEFT ANTECUBITAL  Final   Special Requests   Final    BOTTLES DRAWN AEROBIC AND ANAEROBIC Blood Culture adequate volume   Culture NO GROWTH 4 DAYS  Final   Report Status PENDING  Incomplete  MRSA PCR Screening     Status: None   Collection Time: 02/14/17  7:49 AM  Result Value Ref Range Status   MRSA by PCR NEGATIVE NEGATIVE Final    Comment:        The GeneXpert MRSA Assay (FDA approved for NASAL specimens only), is one component of a comprehensive MRSA colonization surveillance program. It is not intended to diagnose MRSA infection nor to guide or monitor treatment for MRSA infections.          Radiology Studies: Ct Head Wo Contrast  Result Date: 02/12/2017 CLINICAL DATA:  Altered mental status. EXAM: CT HEAD WITHOUT CONTRAST TECHNIQUE: Contiguous axial images were obtained from the base of the skull through the vertex without intravenous contrast. COMPARISON:  CT head without contrast 01/30/2016 FINDINGS: Brain: Moderate atrophy and white matter changes are similar to the prior exam. No acute infarct, hemorrhage, or mass lesion is present. Ventricles are proportionate to the degree of atrophy. Basal ganglia are intact. Insular ribbon is normal bilaterally. Brainstem and cerebellum are stable. Remote lacunar infarcts are present in the cerebellum. Vascular: Vascular calcifications are present in the left vertebral artery and bilateral cavernous internal carotid arteries. There is no hyperdense vessel. Skull: Calvarium is intact. No focal lytic or blastic lesions are present. Sinuses/Orbits: The paranasal sinuses and mastoid air cells are clear. Bilateral lens replacements are present. Globes and orbits are within normal limits. IMPRESSION: 1. Stable moderate atrophy and white matter disease.  This likely reflects the sequela of chronic microvascular ischemia. 2. No acute intracranial abnormality or significant interval change. Electronically Signed   By: San Morelle M.D.   On: 02/12/2017 11:55   Dg Chest Port 1 View  Result Date: 02/13/2017 CLINICAL DATA:  Dyspnea EXAM: PORTABLE CHEST 1 VIEW COMPARISON:  02/10/2017 chest radiograph. FINDINGS: Stable configuration of sternotomy wires and CABG clips. Stable cardiomediastinal silhouette with normal heart size. No pneumothorax. New small bilateral pleural effusions. No pulmonary edema. Hazy bibasilar lung opacities. IMPRESSION: 1. New small bilateral pleural effusions. 2. Hazy  bibasilar lung opacities, favor atelectasis, cannot exclude aspiration or developing pneumonia. Electronically Signed   By: Ilona Sorrel M.D.   On: 02/13/2017 01:40        Scheduled Meds: . amoxicillin-clavulanate  1 tablet Oral Q12H  . insulin aspart  0-9 Units Subcutaneous TID WC  . ipratropium-albuterol  3 mL Nebulization Once  . levalbuterol  0.63 mg Nebulization Q6H  . levothyroxine  137 mcg Oral QAC breakfast  . metoprolol tartrate  100 mg Oral Daily  . mometasone-formoterol  2 puff Inhalation BID  . pantoprazole  40 mg Oral Daily  . potassium chloride  40 mEq Oral Q4H  . predniSONE  40 mg Oral Q breakfast  . rosuvastatin  40 mg Oral q1800   Continuous Infusions: . sodium chloride       LOS: 4 days     Cordelia Poche, MD [facility] 02/14/2017, 11:23 AM Pager: [phone]  If 7PM-7AM, please contact night-coverage [email or web address] Password [facility] 02/14/2017, 11:23 AM

## 2017-02-14 NOTE — Progress Notes (Signed)
Progress Note  Patient Name: Dillon Sweeney Date of Encounter: 02/14/2017  Primary Cardiologist: New to [facility]; Dr. Harrington Challenger  Subjective   Patient feeling better this morning. Reports improvement in energy. Still with some SOB, although improved since initial presentation. Denies CP or palpitations. Having BMs without melena or hematochezia.   Inpatient Medications    Scheduled Meds: . amoxicillin-clavulanate  1 tablet Oral Q12H  . furosemide  40 mg Intravenous Q12H  . insulin aspart  0-9 Units Subcutaneous TID WC  . ipratropium-albuterol  3 mL Nebulization Once  . levalbuterol  0.63 mg Nebulization Q6H  . levothyroxine  137 mcg Oral QAC breakfast  . metoprolol tartrate  100 mg Oral Daily  . mometasone-formoterol  2 puff Inhalation BID  . pantoprazole  40 mg Oral Daily  . potassium chloride  40 mEq Oral Q4H  . predniSONE  40 mg Oral Q breakfast  . rosuvastatin  40 mg Oral q1800   Continuous Infusions: . sodium chloride     PRN Meds: acetaminophen, haloperidol lactate, ondansetron **OR** ondansetron (ZOFRAN) IV   Vital Signs    Vitals:   02/13/17 2140 02/14/17 0200 02/14/17 0500 02/14/17 0759  BP:   (!) 120/53 (!) 133/50  Pulse:   90 87  Resp:   18   Temp:   98.4 F (36.9 C)   TempSrc:   Oral   SpO2: 97% 96% 92% 99%  Weight:   183 lb 13.8 oz (83.4 kg)   Height:        Intake/Output Summary (Last 24 hours) at 02/14/2017 0815 Last data filed at 02/14/2017 0500 Gross per 24 hour  Intake -  Output 1250 ml  Net -1250 ml   Filed Weights   02/12/17 0541 02/13/17 0300 02/14/17 0500  Weight: 180 lb 8.9 oz (81.9 kg) 185 lb 6.5 oz (84.1 kg) 183 lb 13.8 oz (83.4 kg)    Telemetry    Atrial fibrillation yesterday. Sinus this AM - Personally Reviewed  ECG    EKG 1/14 with atrial fibrillation - Personally Reviewed  Physical Exam   GEN: Elderly male laying in bed in no acute distress.   Neck: No JVD, no carotid bruits Cardiac: RRR, no murmurs, rubs, or  gallops.  Respiratory: b/l wheezing, mild crackles in bases GI: NABS, soft, nontender, non-distended  MS: No edema; No deformity. Neuro:  Nonfocal, moving all extremities spontaneously Psych: Pleasant, normal affect   Labs    Chemistry Recent Labs  Lab 02/11/17 1548 02/12/17 1158 02/13/17 0941 02/14/17 0607  NA 131*  --  134* 136  K 3.6  --  3.6 2.9*  CL 101  --  103 99*  CO2 18*  --  19* 22  GLUCOSE 150*  --  153* 106*  BUN 14  --  18 25*  CREATININE 1.14  --  1.34* 1.46*  CALCIUM 8.6*  --  8.5* 8.7*  PROT  --  6.0*  --   --   ALBUMIN  --  2.7*  --   --   AST  --  34  --   --   ALT  --  15*  --   --   ALKPHOS  --  53  --   --   BILITOT  --  1.4*  --   --   GFRNONAA 58*  --  48* 43*  GFRAA >60  --  55* 50*  ANIONGAP 12  --  12 15     Hematology Recent  Labs  Lab 02/11/17 1548 02/12/17 0924 02/12/17 1158 02/13/17 0941 02/13/17 1852  WBC 12.5* 9.0  --  10.9*  --   RBC 2.52* 2.63*  --  2.69* 2.71*  HGB 7.8* 8.1*  --  8.2*  --   HCT 24.1* 24.6*  --  25.0*  --   MCV 95.6 93.5  --  92.9  --   MCH 31.0 30.8  --  30.5  --   MCHC 32.4 32.9  --  32.8  --   RDW 23.3* 22.6*  --  22.7*  --   PLT 76* 65* 58* 73*  --     Cardiac Enzymes Recent Labs  Lab 02/10/17 1829 02/10/17 2320 02/11/17 0649  TROPONINI 1.31* 1.11* 0.48*    Recent Labs  Lab 02/10/17 1245 02/10/17 1538  TROPIPOC 0.70* 0.95*     BNPNo results for input(s): BNP, PROBNP in the last 168 hours.   DDimer  Recent Labs  Lab 02/12/17 1158  DDIMER 3.22*     Radiology    Ct Head Wo Contrast  Result Date: 02/12/2017 CLINICAL DATA:  Altered mental status. EXAM: CT HEAD WITHOUT CONTRAST TECHNIQUE: Contiguous axial images were obtained from the base of the skull through the vertex without intravenous contrast. COMPARISON:  CT head without contrast 01/30/2016 FINDINGS: Brain: Moderate atrophy and white matter changes are similar to the prior exam. No acute infarct, hemorrhage, or mass lesion is  present. Ventricles are proportionate to the degree of atrophy. Basal ganglia are intact. Insular ribbon is normal bilaterally. Brainstem and cerebellum are stable. Remote lacunar infarcts are present in the cerebellum. Vascular: Vascular calcifications are present in the left vertebral artery and bilateral cavernous internal carotid arteries. There is no hyperdense vessel. Skull: Calvarium is intact. No focal lytic or blastic lesions are present. Sinuses/Orbits: The paranasal sinuses and mastoid air cells are clear. Bilateral lens replacements are present. Globes and orbits are within normal limits. IMPRESSION: 1. Stable moderate atrophy and white matter disease. This likely reflects the sequela of chronic microvascular ischemia. 2. No acute intracranial abnormality or significant interval change. Electronically Signed   By: San Morelle M.D.   On: 02/12/2017 11:55   Dg Chest Port 1 View  Result Date: 02/13/2017 CLINICAL DATA:  Dyspnea EXAM: PORTABLE CHEST 1 VIEW COMPARISON:  02/10/2017 chest radiograph. FINDINGS: Stable configuration of sternotomy wires and CABG clips. Stable cardiomediastinal silhouette with normal heart size. No pneumothorax. New small bilateral pleural effusions. No pulmonary edema. Hazy bibasilar lung opacities. IMPRESSION: 1. New small bilateral pleural effusions. 2. Hazy bibasilar lung opacities, favor atelectasis, cannot exclude aspiration or developing pneumonia. Electronically Signed   By: Ilona Sorrel M.D.   On: 02/13/2017 01:40    Cardiac Studies   ECHO 02/13/17: Study Conclusions  - Left ventricle: The cavity size was normal. Wall thickness was   normal. Systolic function was moderately reduced. The estimated   ejection fraction was in the range of 35% to 40%. Diffuse   hypokinesis. - Mitral valve: There was mild to moderate regurgitation directed   centrally. - Left atrium: The atrium was mildly dilated. - Right ventricle: The cavity size was mildly dilated.  Systolic   function was moderately reduced. - Right atrium: The atrium was mildly dilated. - Tricuspid valve: There was moderate regurgitation. - Pulmonary arteries: Systolic pressure was mildly increased. PA   peak pressure: 40 mm Hg (S).  Patient Profile      84 y.o. male  With history of CAD (s/p CABG in  61), AAA (s/p repair), DM, HTN   Presented to [facility] with CP and SOB  Trop elevated 1.3  Hgb 8.4 decreased to 7  Assessment & Plan   3. Acute Systolic Heart Failure: Volume status improved  - ECHO with EF 35-40% with diffuse hypokinesis - I&Os with -1.2L in the past 24hrs - Wean off O2 as tolerated    2  CAD  Pt with  NSTEMI in pt with CAD s/p CABG (1986): possible demand ischemia contributed to elevation in setting of anemia.and CHF Currently CP free.  - Troponin peaked 1.31 > 0.48 - Echo with EF 35-40%, diffuse hypokinesis, dilated LA/RA - Given thrombocytopenia and anemia, he is not a candidate for antiplatelet therapy. Will avoid ASA and heparin.  For now, without pain and with heme problems would not pursure any invasive strategy   - Continue metoprolol, crestor - Can consider addition of ACE Inhibitor/ARB if Cr improves and BP allows  3. Atrial fibrillation: - EKG 02/13/17 with atrial fibrillation, rate 92 - This patients CHA2DS2-VASc Score and unadjusted Ischemic Stroke Rate (% per year) is equal to 9.7 % stroke rate/year from a score of 6 Above score calculated as 1 point each if present [CHF, HTN, DM, Vascular=MI/PAD/Aortic Plaque, Age if 65-74, or Male] Above score calculated as 2 points each if present [Age > 75, or Stroke/TIA/TE] Not a candidate for anticoag given heme problems   - Continue metoprolol for now.  4. Anemia: labs consistent with iron deficiency anemia - s/p 1u PRBC - Hgb stable at 8.2  - Transfuse for <8 given cardiac history - Needs evaluation for blood loss   WOuld not start anticoag  5. HTN: bp stable - Continue current regimen  6.  DM:   7. Renal: baseline Cr 1-1.1  Now 1.5  - likely in setting of IV lasix use  Now held - Continue to monitor Cr closely  8. Likely underlying COPD: wheezing on exam and history of smoking - Continue management per primary team.      For questions or updates, please contact [place] Please consult [email or web address] for contact info under Cardiology/STEMI.      Signed, Abigail Butts, PA-C  02/14/2017, 8:15 AM   [phone]  Pt seen and examined  I have reviewed / amended note by Teodoro Kil above  To reflect findings   Source of anemia not resolved  Not a candidate for anticoag or antiplt Rx   Would need at least a GI eval  On exam: Lungs with rhonchi, mild rales  Cardiac exam Irrreg Irreg  No S3  Ext without significant edema    For now keep on same regimen Would follow Cr in am  May initiate PO lasix based on Cr.  Dorris Carnes

## 2017-02-14 NOTE — Care Management Important Message (Signed)
Important Message  Patient Details  Name: Dillon Sweeney MRN: [id number] Date of Birth: 1932/09/02   Medicare Important Message Given:  Yes    Orbie Pyo 02/14/2017, 12:35 PM

## 2017-02-14 NOTE — Evaluation (Signed)
Physical Therapy Evaluation Patient Details Name: Dillon Sweeney MRN: [id number] DOB: 1932/03/30 Today's Date: 02/14/2017   History of Present Illness  Pt is an 84 y.o. male admitted 02/10/17 with c/o chest pain and difficulty breathing; worked up for NSTEMI possibly demand ischemia in setting of anemia. Echo showing EF 35-40% and diffuse hypokinesis. EKG 1/14 showing a-fib. PMH includes CAD (s/p CABG 0737), acute systolic HF, AAA (s/p repair), HTN, DM, AKI, likely underlying COPD.    Clinical Impression  Pt presents with decreased activity tolerance and an overall decrease in functional mobility secondary to above. PTA, pt indep and lives at home with wife. Today, able to ambulate 200' with RW, requiring 1x standing rest break due to DOE 2/4; significant SOB upon returning to room. SpO2 >90% on 3L O2 [place] (does not wear home O2); educ on deep breathing and activity pacing. Pt would benefit from continued acute PT services to maximize functional mobility and independence prior to d/c with HHPT services.     Follow Up Recommendations Home health PT    Equipment Recommendations  None recommended by PT    Recommendations for Other Services       Precautions / Restrictions Precautions Precautions: Fall Restrictions Weight Bearing Restrictions: No      Mobility  Bed Mobility Overal bed mobility: Independent                Transfers Overall transfer level: Needs assistance Equipment used: Rolling walker (2 wheeled) Transfers: Sit to/from Stand Sit to Stand: Supervision            Ambulation/Gait Ambulation/Gait assistance: Supervision Ambulation Distance (Feet): 200 Feet Assistive device: Rolling walker (2 wheeled) Gait Pattern/deviations: Step-through pattern;Decreased stride length;Trunk flexed Gait velocity: Decreased Gait velocity interpretation: <1.8 ft/sec, indicative of risk for recurrent falls General Gait Details: Slow, controlled amb with RW and supervision for  safety. 1x standing rest break secondary to DOE 2/4 with ambulation. SpO2 >90% on 2L O2 [place]  Stairs            Wheelchair Mobility    Modified Rankin (Stroke Patients Only)       Balance Overall balance assessment: Needs assistance   Sitting balance-Leahy Scale: Good       Standing balance-Leahy Scale: Fair Standing balance comment: Can static stand with no UE support                             Pertinent Vitals/Pain Pain Assessment: No/denies pain    Home Living Family/patient expects to be discharged to:: Private residence Living Arrangements: Spouse/significant other Available Help at Discharge: Family;Available 24 hours/day Type of Home: House Home Access: Stairs to enter Entrance Stairs-Rails: Right Entrance Stairs-Number of Steps: 5 Home Layout: One level Home Equipment: Walker - 2 wheels;Grab bars - tub/shower      Prior Function Level of Independence: Independent         Comments: Still drives, walks dog. Wife in good health and drives as well     Hand Dominance        Extremity/Trunk Assessment   Upper Extremity Assessment Upper Extremity Assessment: Overall WFL for tasks assessed    Lower Extremity Assessment Lower Extremity Assessment: Generalized weakness       Communication   Communication: No difficulties  Cognition Arousal/Alertness: Awake/alert Behavior During Therapy: WFL for tasks assessed/performed Overall Cognitive Status: Within Functional Limits for tasks assessed  General Comments      Exercises     Assessment/Plan    PT Assessment Patient needs continued PT services  PT Problem List Decreased strength;Decreased activity tolerance;Decreased balance;Decreased mobility;Cardiopulmonary status limiting activity       PT Treatment Interventions DME instruction;Gait training;Stair training;Functional mobility training;Therapeutic  activities;Therapeutic exercise;Balance training;Patient/family education    PT Goals (Current goals can be found in the Care Plan section)  Acute Rehab PT Goals Patient Stated Goal: Return home PT Goal Formulation: With patient Time For Goal Achievement: 02/28/17 Potential to Achieve Goals: Good    Frequency Min 3X/week   Barriers to discharge        Co-evaluation               AM-PAC PT "6 Clicks" Daily Activity  Outcome Measure Difficulty turning over in bed (including adjusting bedclothes, sheets and blankets)?: None Difficulty moving from lying on back to sitting on the side of the bed? : None Difficulty sitting down on and standing up from a chair with arms (e.g., wheelchair, bedside commode, etc,.)?: A Little Help needed moving to and from a bed to chair (including a wheelchair)?: A Little Help needed walking in hospital room?: A Little Help needed climbing 3-5 steps with a railing? : A Little 6 Click Score: 20    End of Session Equipment Utilized During Treatment: Gait belt;Oxygen Activity Tolerance: Patient tolerated treatment well Patient left: in chair;with call bell/phone within reach;with chair alarm set Nurse Communication: Mobility status PT Visit Diagnosis: Other abnormalities of gait and mobility (R26.89)    Time: 2426-8341 PT Time Calculation (min) (ACUTE ONLY): 25 min   Charges:   PT Evaluation $PT Eval Moderate Complexity: 1 Mod PT Treatments $Gait Training: 8-22 mins   PT G Codes:       Mabeline Caras, PT, DPT Acute Rehab Services  Pager: [place] 02/14/2017, 10:45 AM

## 2017-02-15 DIAGNOSIS — I4891 Unspecified atrial fibrillation: Secondary | ICD-10-CM

## 2017-02-15 DIAGNOSIS — I2510 Atherosclerotic heart disease of native coronary artery without angina pectoris: Secondary | ICD-10-CM

## 2017-02-15 DIAGNOSIS — I5021 Acute systolic (congestive) heart failure: Secondary | ICD-10-CM

## 2017-02-15 LAB — CULTURE, BLOOD (ROUTINE X 2)
CULTURE: NO GROWTH
Culture: NO GROWTH
Special Requests: ADEQUATE
Special Requests: ADEQUATE

## 2017-02-15 LAB — GLUCOSE, CAPILLARY
GLUCOSE-CAPILLARY: 181 mg/dL — AB (ref 65–99)
Glucose-Capillary: 127 mg/dL — ABNORMAL HIGH (ref 65–99)
Glucose-Capillary: 149 mg/dL — ABNORMAL HIGH (ref 65–99)
Glucose-Capillary: 233 mg/dL — ABNORMAL HIGH (ref 65–99)

## 2017-02-15 LAB — CBC
HCT: 25.2 % — ABNORMAL LOW (ref 39.0–52.0)
Hemoglobin: 8.2 g/dL — ABNORMAL LOW (ref 13.0–17.0)
MCH: 30.9 pg (ref 26.0–34.0)
MCHC: 32.5 g/dL (ref 30.0–36.0)
MCV: 95.1 fL (ref 78.0–100.0)
PLATELETS: 65 10*3/uL — AB (ref 150–400)
RBC: 2.65 MIL/uL — ABNORMAL LOW (ref 4.22–5.81)
RDW: 22.5 % — ABNORMAL HIGH (ref 11.5–15.5)
WBC: 12.4 10*3/uL — ABNORMAL HIGH (ref 4.0–10.5)

## 2017-02-15 LAB — BASIC METABOLIC PANEL
Anion gap: 13 (ref 5–15)
BUN: 26 mg/dL — AB (ref 6–20)
CHLORIDE: 101 mmol/L (ref 101–111)
CO2: 23 mmol/L (ref 22–32)
CREATININE: 1.2 mg/dL (ref 0.61–1.24)
Calcium: 8.7 mg/dL — ABNORMAL LOW (ref 8.9–10.3)
GFR calc Af Amer: >60 mL/min (ref >60)
GFR, EST NON AFRICAN AMERICAN: 54 mL/min — AB (ref >60)
GLUCOSE: 141 mg/dL — AB (ref 65–99)
Potassium: 4.1 mmol/L (ref 3.5–5.1)
SODIUM: 137 mmol/L (ref 135–145)

## 2017-02-15 MED ORDER — METHYLPREDNISOLONE SODIUM SUCC 125 MG IJ SOLR
60.0000 mg | Freq: Two times a day (BID) | INTRAMUSCULAR | Status: DC
  Administered 2017-02-15 – 2017-02-16 (×3): 60 mg via INTRAVENOUS
  Filled 2017-02-15 (×3): qty 2

## 2017-02-15 MED ORDER — FUROSEMIDE 40 MG PO TABS
40.0000 mg | ORAL_TABLET | Freq: Two times a day (BID) | ORAL | Status: DC
  Administered 2017-02-16: 40 mg via ORAL
  Filled 2017-02-15: qty 1

## 2017-02-15 MED ORDER — METOPROLOL SUCCINATE ER 100 MG PO TB24
100.0000 mg | ORAL_TABLET | Freq: Every day | ORAL | Status: DC
  Administered 2017-02-16 – 2017-02-18 (×3): 100 mg via ORAL
  Filled 2017-02-15 (×3): qty 1

## 2017-02-15 MED ORDER — BENZONATATE 100 MG PO CAPS
200.0000 mg | ORAL_CAPSULE | Freq: Three times a day (TID) | ORAL | Status: DC | PRN
  Administered 2017-02-15: 200 mg via ORAL
  Filled 2017-02-15: qty 2

## 2017-02-15 MED ORDER — GUAIFENESIN ER 600 MG PO TB12
1200.0000 mg | ORAL_TABLET | Freq: Two times a day (BID) | ORAL | Status: DC
  Administered 2017-02-15 – 2017-02-19 (×9): 1200 mg via ORAL
  Filled 2017-02-15 (×9): qty 2

## 2017-02-15 NOTE — Progress Notes (Signed)
Ambulated with patient in hall on 3 liters of oxygen.  Pt tolerated well.

## 2017-02-15 NOTE — Progress Notes (Signed)
SATURATION QUALIFICATIONS: (This note is used to comply with regulatory documentation for home oxygen)  Patient Saturations on Room Air at Rest = 89%  Patient Saturations on Room Air while Ambulating = 84%  Patient Saturations on 2 Liters of oxygen while Ambulating = 88%  Patient Saturations on 3 Liters of oxygen while Ambulating = 94%  Please briefly explain why patient needs home oxygen: Pt with significant SOB and SpO2 down to 84% when ambulating on RA. SpO2 increased to 94% with 3L O2 .   Mabeline Caras, PT, DPT Acute Rehab Services  Pager: [phone]

## 2017-02-15 NOTE — Evaluation (Signed)
Clinical/Bedside Swallow Evaluation Patient Details  Name: Dillon Sweeney MRN: [id number] Date of Birth: 1932/03/03  Today's Date: 02/15/2017 Time: SLP Start Time (ACUTE ONLY): 2119 SLP Stop Time (ACUTE ONLY): 0945 SLP Time Calculation (min) (ACUTE ONLY): 20 min  Past Medical History:  Past Medical History:  Diagnosis Date  . AAA (abdominal aortic aneurysm) ([place])   . Atherosclerosis   . Bladder stones   . CAD (coronary artery disease)   . Cholelithiasis   . Chronic constipation   . Diabetes mellitus    Type 2  . Dyslipidemia   . Hyperlipidemia   . Hypertension   . Hypothyroidism   . Myocardial infarction ([place]) 09/22/10   1986  . Pneumonia 2012   Past Surgical History:  Past Surgical History:  Procedure Laterality Date  . ABDOMINAL AORTIC ANEURYSM REPAIR  09/15/10   Juxtarenal AAA repair   . APPENDECTOMY    . CORONARY ARTERY BYPASS GRAFT  1986   left leg vein harvest  . IR GENERIC HISTORICAL  03/04/2016   IR ANGIO VERTEBRAL SEL VERTEBRAL BILAT MOD SED 03/04/2016 Luanne Bras, MD [facility]-INTER[facility]  . IR GENERIC HISTORICAL  03/04/2016   IR ANGIO INTRA EXTRACRAN SEL COM CAROTID INNOMINATE BILAT MOD SED 03/04/2016 Luanne Bras, MD [facility]  . TONSILLECTOMY     HPI:  Dillon Sweeney a 84 y.o.malewith medical history significantfor CABG- 1986, AAA, DM, HTN, Hypothyroidism, presented [facility] with complaints of chest pain and difficulty breathing of 3 days duration.Patient reports pressure-like chest pain radiating to both arms.Patient also reports upper abdominal pain that started about the same time.Patient denies associated dizziness.Patient reports black stools for about 2 weeks,last black stool was 5-6 days ago.No bloody stools no vomiting. Patient reports a cough of 2-3 weeks duration,productive of brownish sputum. Hedenies fevers or chills at home, nodysuria or frequency,no loose stools,no headaches or neck stiffness.Patient's says he quit  smoking cigarettes 2 months ago,smoked ~1 pack per week.  Pt transfered to [facility] for further medical management. CT revealed stable moderate atrophy and white matter disease which is likely reflects the sequela of chronic microvascular ischemia with no acute intracranial abnormality or significant interval change. Chest x ray revealed mild enlargement of cardiac silhouette post CABG with COPD changes without acute infiltrate.   Assessment / Plan / Recommendation Clinical Impression  Pt appears at reduced risk of aspiration when following general aspiration precautions. Pt consumed solids with thin liquids via straw without overt s/s of aspiration. Pt with 1 cough when seated further back in bed. Pt realized and repositioned himself successfully with no overt s/s of aspiration following repositioning. Wife present and education provided on general aspiration precautions. No ST services are indicated, ST to sign off.  SLP Visit Diagnosis: Dysphagia, unspecified (R13.10)    Aspiration Risk  No limitations    Diet Recommendation Regular;Thin liquid   Liquid Administration via: Straw;Cup Medication Administration: Whole meds with liquid Supervision: Patient able to self feed Compensations: Minimize environmental distractions;Slow rate;Small sips/bites Postural Changes: Seated upright at 90 degrees    Other  Recommendations Oral Care Recommendations: Oral care BID   Follow up Recommendations None      Frequency and Duration            Prognosis        Swallow Study   General Date of Onset: 02/10/17 HPI: Mead [place]ane Sweeney a 84 y.o.malewith medical history significantfor CABG- 1986, AAA, DM, HTN, Hypothyroidism, presented to [facility] with complaints of chest pain and  difficulty breathing of 3 days duration.Patient reports pressure-like chest pain radiating to both arms.Patient also reports upper abdominal pain that started about the same time.Patient denies associated  dizziness.Patient reports black stools for about 2 weeks,last black stool was 5-6 days ago.No bloody stools no vomiting. Patient reports a cough of 2-3 weeks duration,productive of brownish sputum. Hedenies fevers or chills at home, nodysuria or frequency,no loose stools,no headaches or neck stiffness.Patient's says he quit smoking cigarettes 2 months ago,smoked ~1 pack per week.  Pt transfered to [facility] for further medical management. CT revealed stable moderate atrophy and white matter disease which is likely reflects the sequela of chronic microvascular ischemia with no acute intracranial abnormality or significant interval change. Chest x ray revealed mild enlargement of cardiac silhouette post CABG with COPD changes without acute infiltrate. Type of Study: Bedside Swallow Evaluation Previous Swallow Assessment: none in chart Diet Prior to this Study: Regular;Thin liquids Temperature Spikes Noted: No Respiratory Status: Nasal cannula History of Recent Intubation: No Behavior/Cognition: Alert;Cooperative Oral Cavity Assessment: Within Functional Limits Oral Care Completed by SLP: No Oral Cavity - Dentition: Adequate natural dentition Vision: Functional for self-feeding Self-Feeding Abilities: Able to feed self Patient Positioning: Upright in bed Baseline Vocal Quality: Normal Volitional Cough: Strong Volitional Swallow: Able to elicit    Oral/Motor/Sensory Function Overall Oral Motor/Sensory Function: Within functional limits   Ice Chips Ice chips: Within functional limits Presentation: Self Fed   Thin Liquid Thin Liquid: Within functional limits Presentation: Self Fed;Straw    Nectar Thick Nectar Thick Liquid: Not tested   Honey Thick Honey Thick Liquid: Not tested   Puree Puree: Within functional limits Presentation: Self Fed;Spoon   Solid   GO   Solid: Within functional limits Presentation: [place] 02/15/2017,10:10 AM

## 2017-02-15 NOTE — Progress Notes (Signed)
Physical Therapy Treatment Patient Details Name: Dillon Sweeney MRN: [id number] DOB: Feb 14, 1932 Today's Date: 02/15/2017    History of Present Illness Pt is an 84 y.o. male admitted 02/10/17 with c/o chest pain and difficulty breathing; worked up for NSTEMI possibly demand ischemia in setting of anemia. Echo showing EF 35-40% and diffuse hypokinesis. EKG 1/14 showing a-fib. PMH includes CAD (s/p CABG 5176), acute systolic HF, AAA (s/p repair), HTN, DM, AKI, likely underlying COPD.   PT Comments    Pt with increased SOB while ambulating today, requiring 4x seated rest breaks. Besides this, pt is moving well with RW. SpO2 down to 84% on RA; increased to >90% on 3L O2 [place]. Educ on activity pacing and pursed lip breathing. Recommend use of pt's rollator at home for energy conservation, in addition to [place]. Wife present during session and very supportive. Will continue to follow acutely.   Follow Up Recommendations  Home health PT     Equipment Recommendations  None recommended by PT    Recommendations for Other Services       Precautions / Restrictions Precautions Precautions: Fall Restrictions Weight Bearing Restrictions: No    Mobility  Bed Mobility Overal bed mobility: Independent                Transfers Overall transfer level: Needs assistance Equipment used: Rolling walker (2 wheeled) Transfers: Sit to/from Stand Sit to Stand: Supervision;Modified independent (Device/Increase time)         General transfer comment: Stood 6x throughout session with RW, initially supervision for safety, progressing to mod indep. Good technique standing from varying heights, with and without arm rests to push up from  Ambulation/Gait   Ambulation Distance (Feet): 150 Feet Assistive device: Rolling walker (2 wheeled) Gait Pattern/deviations: Step-through pattern;Decreased stride length;Trunk flexed Gait velocity: Decreased Gait velocity interpretation: <1.8 ft/sec,  indicative of risk for recurrent falls General Gait Details: Slow, controlled amb with RW and supervision for safety. 4x seated rest break secondary to increased SOB. SpO2 down to 84% on RA; >90% on 3L O2 [place]   Stairs            Wheelchair Mobility    Modified Rankin (Stroke Patients Only)       Balance Overall balance assessment: Needs assistance   Sitting balance-Leahy Scale: Good       Standing balance-Leahy Scale: Fair Standing balance comment: Can static stand with no UE support                            Cognition Arousal/Alertness: Awake/alert Behavior During Therapy: WFL for tasks assessed/performed Overall Cognitive Status: Within Functional Limits for tasks assessed                                        Exercises      General Comments General comments (skin integrity, edema, etc.): Wife present during session      Pertinent Vitals/Pain Pain Assessment: No/denies pain    Home Living                      Prior Function            PT Goals (current goals can now be found in the care plan section) Acute Rehab PT Goals Patient Stated Goal: Return home PT Goal Formulation: With patient Time For Goal Achievement:  02/28/17 Potential to Achieve Goals: Good Progress towards PT goals: Progressing toward goals    Frequency    Min 3X/week      PT Plan Current plan remains appropriate    Co-evaluation              AM-PAC PT "6 Clicks" Daily Activity  Outcome Measure  Difficulty turning over in bed (including adjusting bedclothes, sheets and blankets)?: None Difficulty moving from lying on back to sitting on the side of the bed? : None Difficulty sitting down on and standing up from a chair with arms (e.g., wheelchair, bedside commode, etc,.)?: None Help needed moving to and from a bed to chair (including a wheelchair)?: A Little Help needed walking in hospital room?: A Little Help needed climbing 3-5  steps with a railing? : A Little 6 Click Score: 21    End of Session Equipment Utilized During Treatment: Gait belt;Oxygen Activity Tolerance: Patient tolerated treatment well Patient left: in bed;with call bell/phone within reach;with family/visitor present Nurse Communication: Mobility status PT Visit Diagnosis: Other abnormalities of gait and mobility (R26.89)     Time: 8403-7543 PT Time Calculation (min) (ACUTE ONLY): 28 min  Charges:  $Gait Training: 8-22 mins $Therapeutic Activity: 8-22 mins                    G Codes:      Mabeline Caras, PT, DPT Acute Rehab Services  Pager: [place] 02/15/2017, 9:40 AM

## 2017-02-15 NOTE — Progress Notes (Signed)
PROGRESS NOTE    Dillon Sweeney  PZW:[id number] DOB: December 20, 1932 DOA: 02/10/2017 PCP: Eustaquio Maize, MD   Brief Narrative:  Dillon Sweeney is a 84 y.o. man admitted on 1/11 after visiting his PCP for shortness of breath and being referred to the hospital. 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 Also has a history of AAA, diabetes, hypertension, hypothyroidism. In the ED he also complained of chest pain that he described as pressure-like radiating to both arms. In the ED he was found to meet SIRS criteria and was given broad-spectrum antibiotics. Because of elevated troponins with a high of 1.31, Cardiology was consulted overnight who recommended transfer to [facility] for further evaluation. However due to lack of bed availability patient remains in the emergencydepartment at [facility]. Admitted for Acute Respiratory Failure with Hypoxia, Acute Systolic CHF Exacerbation, and Dyspnea. Currently being diuresed and treated for Aspiration PNA  Assessment & Plan:   Principal Problem:   Chest pain Active Problems:   Abdominal aortic aneurysm (HCC)   Diabetes (HCC)   HTN (hypertension)   Tobacco use disorder   Myocardial infarction   Lung nodule   Shortness of breath   Troponin level elevated   Thrombocytopenia (HCC)   Normocytic anemia   Acute on chronic systolic heart failure (HCC)  Acute Respiratory Failure with Hypoxia -Multifactorial -Patient qualifies for 3 Liters of O2 given Walk screen -C/w Diuresis per Cards -Started IV Solumedrol   Chest pain -Resolved.  -Concern for NSTEMI however troponin trended down. -Cardiology consulted and assessment is this is likely related to anemia and Type 2 Nstemi.  -Recommending stress test as an outpatient.  Anemia andThrombocytopenia -Unknown etiology.  -Patient without history of GI bleeding.  -Per chart review, patient has had chronic anemia up until 2012.  -Last hemoglobin  prior to admission was about one year prior. -History of colonoscopy within a few years with unknown results per patient. S/p 1 unit of PRBC since admission. FOBT negative. - DIC panel abnormal with elevated PT/PTT, fibrinogen and d-dimer. LDH elevated with slightly elevated total/indirect bilirubin. Unknown if this is chronic. Haptoglobin elevated. -Direct coombs negative. Hematology recommending supportive care with Transfusions  -Hb stable at 8.2 -Hematology Recc's  appreciated  -Repeat CBC in AM   Wheezing -Likely COPD, but patient does not have official diagnosis. Quit smoking three months ago. -Xopenx q 6 hrs -Continue Dulera -Changed prednisone to IV Solumedrol -Mucinex   Dyspnea -Combination of bronchitis/COPD and pulmonary edema and Aspiration PNA. Resolved. -Cardiology starting scheduled diuresis and it was held because of AKI -Repeat CXR in AM   Acute Systolic Heart Failure -Unsure if this is chronic. EF of 35-40% with diffuse hypokinesis on echocardiogram from 1/14.  -Patient presented with concern for Nstemi. -Cardiology recommendations appreciated -Cardiology changed patient to Metoprolol Succinate 100 mg po Daily  -Restarted po Lasix 40 mg po BID  -Strict I's/O's, Daily Weights, and SLIV -Patient is +682.3 mL and Weight is Up + 5 Lbs   Acute Kidney Injury, improved  -Likely secondary to diuresis. -Recommendeed to hold lasix but will now resume at 40 mg BID now that Cr is improved  -Repeat CMP in AM   Atrial Fibrillation  -Likely rebound from decrease in metoprolol. EKG suggests atrial fibrillation. Rate better controlled. -Continue metoprolol but changed to Metoprolol Succinate by Cardiology  -Cardiology recommendations appreciated. Per Cards patient is not a candidate for Anticoagulation given recent Problems  SIRS from PNA  -Criteria met on admission. No source initially but now with evidence of aspiration pneumonia.  -Given empiric antibiotics. Blood  culture with no growth x3 days.  -Urine culture without growth. Chest x-ray suggesting pneumonia. -C/w Augmentin -SLP Recommending Regular Diet with Thin Liquids   Aspiration Pneumonia -Suggested on chest x-ray. -C/w Augmentin -C/w Mucinex and Benzonatate -C/w Xopenex 0.63 mg Neb TID and Dulera -Started IV Solumedrol 50 mg q12h  Lung nodule -Incidental. Outpatient follow-up with repeat chest CT in 3 months  Diabetes mellitus -C/w Sensitive Novolog SSI AC  Delirium -Appears to have resolved. Unknown etiology. Patient has underlying cognitive impairment per colleague discussion with wife.  -CT head negative for hematoma.  -Delirium Precautions  -Appears to have resolved.  Hypothyroidism -Low TSH. -Decreased Synthroid dose to 137 mcg daily  DVT prophylaxis: SCDs given recent Anemia Code Status: FULL CODE Family Communication: Discussed with wife at bedside Disposition Plan: [place] [facility] when medically stable at D/C  Consultants:   Cardiology  Hematology/Oncology  Procedures:  ECHOCARDIOGRAM ------------------------------------------------------------------- Study Conclusions  - Left ventricle: The cavity size was normal. Wall thickness was   normal. Systolic function was moderately reduced. The estimated   ejection fraction was in the range of 35% to 40%. Diffuse   hypokinesis. - Mitral valve: There was mild to moderate regurgitation directed   centrally. - Left atrium: The atrium was mildly dilated. - Right ventricle: The cavity size was mildly dilated. Systolic   function was moderately reduced. - Right atrium: The atrium was mildly dilated. - Tricuspid valve: There was moderate regurgitation. - Pulmonary arteries: Systolic pressure was mildly increased. PA   peak pressure: 40 mm Hg (S).   Antimicrobials:  Anti-infectives (From admission, onward)   Start     Dose/Rate Route Frequency Ordered Stop   02/14/17 1000  amoxicillin-clavulanate  (AUGMENTIN) 875-125 MG per tablet 1 tablet     1 tablet Oral Every 12 hours 02/14/17 0716     02/11/17 2200  piperacillin-tazobactam (ZOSYN) IVPB 3.375 g  Status:  Discontinued     3.375 g 12.5 mL/hr over 240 Minutes Intravenous Every 8 hours 02/11/17 1633 02/12/17 1022   02/11/17 0800  vancomycin (VANCOCIN) IVPB 750 mg/150 ml premix  Status:  Discontinued     750 mg 150 mL/hr over 60 Minutes Intravenous Every 12 hours 02/10/17 2035 02/11/17 1436   02/10/17 2200  piperacillin-tazobactam (ZOSYN) IVPB 3.375 g  Status:  Discontinued     3.375 g 12.5 mL/hr over 240 Minutes Intravenous Every 8 hours 02/10/17 2031 02/11/17 1436   02/10/17 1900  vancomycin (VANCOCIN) 1,500 mg in sodium chloride 0.9 % 500 mL IVPB     1,500 mg 250 mL/hr over 120 Minutes Intravenous  Once 02/10/17 1834 02/10/17 2120   02/10/17 1445  cefTRIAXone (ROCEPHIN) 1 g in dextrose 5 % 50 mL IVPB  Status:  Discontinued     1 g 100 mL/hr over 30 Minutes Intravenous  Once 02/10/17 1441 02/10/17 1444   02/10/17 1445  piperacillin-tazobactam (ZOSYN) IVPB 3.375 g     3.375 g 100 mL/hr over 30 Minutes Intravenous  Once 02/10/17 1444 02/10/17 1553     Subjective: Seen and examined at bedside and states he was awake all night watching tv. SOB is stable and denied CP. No nausea or vomiting.   Objective: Vitals:   02/14/17 1154 02/14/17 1432 02/14/17 1923 02/15/17 0431  BP: (!) 117/56  (!) 117/56 139/64  Pulse: 77  86 98  Resp: 18  18 18  Temp: 98.5 F (36.9 C)  (!) 97.4 F (36.3 C) 98 F (36.7 C)  TempSrc: Oral  Oral Oral  SpO2: 100% 94% 95% 94%  Weight:    84.2 kg (185 lb 10 oz)  Height:        Intake/Output Summary (Last 24 hours) at 02/15/2017 0755 Last data filed at 02/15/2017 0433 Gross per 24 hour  Intake 290 ml  Output 2000 ml  Net -1710 ml   Filed Weights   02/13/17 0300 02/14/17 0500 02/15/17 0431  Weight: 84.1 kg (185 lb 6.5 oz) 83.4 kg (183 lb 13.8 oz) 84.2 kg (185 lb 10 oz)   Examination: Physical  Exam:  Constitutional: Caucasian male in NAD and appears calm and comfortable Eyes: Lids and conjunctivae normal, sclerae anicteric  ENMT: External Ears, Nose appear normal. Grossly normal hearing. Mucous membranes are moist.  Neck: Appears normal, supple, no cervical masses, normal ROM, no appreciable thyromegaly, no JVD Respiratory: Diminished to auscultation bilaterally with expiratory wheezing and some crackles. Slightly increased respiratory effort and patient is not tachypenic. No accessory muscle use.  Cardiovascular: Irregularly Irregular, no murmurs / rubs / gallops. S1 and S2 auscultated. Mild LE Edema Abdomen: Soft, non-tender, non-distended. No masses palpated. No appreciable hepatosplenomegaly. Bowel sounds positive x4.  GU: Deferred. Musculoskeletal: No clubbing / cyanosis of digits/nails. No joint deformity upper and lower extremities Skin: No rashes, lesions, ulcers on a limited skin eval. No induration; Warm and dry.  Neurologic: CN 2-12 grossly intact with no focal deficits. Romberg sign cerebellar reflexes not assessed.  Psychiatric: Normal judgment and insight. Alert and oriented x 3. Normal mood and appropriate affect.   Data Reviewed: I have personally reviewed following labs and imaging studies  CBC: Recent Labs  Lab 02/10/17 1229 02/10/17 2319 02/11/17 1548 02/12/17 0924 02/12/17 1158 02/13/17 0941 02/14/17 0607  WBC 14.5* 10.9* 12.5* 9.0  --  10.9* 8.8  NEUTROABS 12.9*  --   --   --   --   --   --   HGB 8.4* 7.3* 7.8* 8.1*  --  8.2* 8.2*  HCT 25.7* 22.6* 24.1* 24.6*  --  25.0* 24.9*  MCV 97.3 97.8 95.6 93.5  --  92.9 94.3  PLT 72* 73* 76* 65* 58* 73* 68*   Basic Metabolic Panel: Recent Labs  Lab 02/10/17 1229 02/11/17 1548 02/13/17 0941 02/14/17 0607  NA 130* 131* 134* 136  K 4.0 3.6 3.6 2.9*  CL 97* 101 103 99*  CO2 20* 18* 19* 22  GLUCOSE 162* 150* 153* 106*  BUN '20 14 18 '$ 25*  CREATININE 1.24 1.14 1.34* 1.46*  CALCIUM 9.2 8.6* 8.5* 8.7*  MG   --   --   --  2.1   GFR: Estimated Creatinine Clearance: 39 mL/min (A) (by C-G formula based on SCr of 1.46 mg/dL (H)). Liver Function Tests: Recent Labs  Lab 02/12/17 1158  AST 34  ALT 15*  ALKPHOS 53  BILITOT 1.4*  PROT 6.0*  ALBUMIN 2.7*   No results for input(s): LIPASE, AMYLASE in the last 168 hours. No results for input(s): AMMONIA in the last 168 hours. Coagulation Profile: Recent Labs  Lab 02/12/17 1158  INR 1.31   Cardiac Enzymes: Recent Labs  Lab 02/10/17 1829 02/10/17 2320 02/11/17 0649  TROPONINI 1.31* 1.11* 0.48*   BNP (last 3 results) No results for input(s): PROBNP in the last 8760 hours. HbA1C: No results for input(s): HGBA1C in the last 72 hours. CBG: Recent Labs  Lab  02/14/17 0758 02/14/17 1135 02/14/17 1637 02/14/17 2035 02/15/17 0734  GLUCAP 108* 148* 152* 158* 127*   Lipid Profile: No results for input(s): CHOL, HDL, LDLCALC, TRIG, CHOLHDL, LDLDIRECT in the last 72 hours. Thyroid Function Tests: Recent Labs    02/12/17 0924  TSH 0.159*   Anemia Panel: Recent Labs    02/13/17 1852  VITAMINB12 1,000*  FOLATE 11.2  FERRITIN 630*  TIBC 234*  IRON 24*  RETICCTPCT 1.6   Sepsis Labs: Recent Labs  Lab 02/10/17 1300 02/10/17 1517 02/10/17 1829  LATICACIDVEN 2.74* 2.3* 1.9    Recent Results (from the past 240 hour(s))  Urine culture     Status: None   Collection Time: 02/10/17 12:46 PM  Result Value Ref Range Status   Specimen Description URINE, CATHETERIZED  Final   Special Requests NONE  Final   Culture   Final    NO GROWTH Performed at [facility], [place] [address] [place], [place] [address]    Report Status 02/12/2017 FINAL  Final  Blood culture (routine x 2)     Status: None   Collection Time: 02/10/17  3:25 PM  Result Value Ref Range Status   Specimen Description BLOOD RIGHT FOREARM  Final   Special Requests   Final    BOTTLES DRAWN AEROBIC AND ANAEROBIC Blood Culture adequate volume   Culture NO GROWTH 5  DAYS  Final   Report Status 02/15/2017 FINAL  Final  Blood culture (routine x 2)     Status: None   Collection Time: 02/10/17  3:37 PM  Result Value Ref Range Status   Specimen Description LEFT ANTECUBITAL  Final   Special Requests   Final    BOTTLES DRAWN AEROBIC AND ANAEROBIC Blood Culture adequate volume   Culture NO GROWTH 5 DAYS  Final   Report Status 02/15/2017 FINAL  Final  MRSA PCR Screening     Status: None   Collection Time: 02/14/17  7:49 AM  Result Value Ref Range Status   MRSA by PCR NEGATIVE NEGATIVE Final    Comment:        The GeneXpert MRSA Assay (FDA approved for NASAL specimens only), is one component of a comprehensive MRSA colonization surveillance program. It is not intended to diagnose MRSA infection nor to guide or monitor treatment for MRSA infections.     Radiology Studies: No results found.  Scheduled Meds: . amoxicillin-clavulanate  1 tablet Oral Q12H  . insulin aspart  0-9 Units Subcutaneous TID WC  . levalbuterol  0.63 mg Nebulization TID  . levothyroxine  137 mcg Oral QAC breakfast  . metoprolol tartrate  100 mg Oral Daily  . mometasone-formoterol  2 puff Inhalation BID  . pantoprazole  40 mg Oral Daily  . predniSONE  40 mg Oral Q breakfast  . rosuvastatin  40 mg Oral q1800   Continuous Infusions: . sodium chloride      LOS: 5 days   Kerney Elbe, DO [facility] Pager [phone]  If 7PM-7AM, please contact night-coverage [email or web address] Password [facility] 02/15/2017, 7:55 AM

## 2017-02-15 NOTE — Progress Notes (Signed)
Progress Note  Patient Name: Dillon Sweeney Date of Encounter: 02/15/2017  Primary Cardiologist: Dorris Carnes, MD -New. Wants to follow up in [place].  Subjective   Pt feeling better, except did not sleep well due to "dry throat". A tessalon pearl helped a little. He denies chest pain or shortness of breath.   Inpatient Medications    Scheduled Meds: . amoxicillin-clavulanate  1 tablet Oral Q12H  . guaiFENesin  1,200 mg Oral BID  . insulin aspart  0-9 Units Subcutaneous TID WC  . levalbuterol  0.63 mg Nebulization TID  . levothyroxine  137 mcg Oral QAC breakfast  . methylPREDNISolone (SOLU-MEDROL) injection  60 mg Intravenous Q12H  . metoprolol tartrate  100 mg Oral Daily  . mometasone-formoterol  2 puff Inhalation BID  . pantoprazole  40 mg Oral Daily  . rosuvastatin  40 mg Oral q1800   Continuous Infusions: . sodium chloride     PRN Meds: acetaminophen, benzonatate, haloperidol lactate, ondansetron **OR** ondansetron (ZOFRAN) IV   Vital Signs    Vitals:   02/14/17 1432 02/14/17 1923 02/15/17 0431 02/15/17 0856  BP:  (!) 117/56 139/64 133/65  Pulse:  86 98 (!) 113  Resp:  18 18   Temp:  (!) 97.4 F (36.3 C) 98 F (36.7 C)   TempSrc:  Oral Oral   SpO2: 94% 95% 94% 93%  Weight:   185 lb 10 oz (84.2 kg)   Height:        Intake/Output Summary (Last 24 hours) at 02/15/2017 1134 Last data filed at 02/15/2017 0908 Gross per 24 hour  Intake 170 ml  Output 1500 ml  Net -1330 ml   Filed Weights   02/13/17 0300 02/14/17 0500 02/15/17 0431  Weight: 185 lb 6.5 oz (84.1 kg) 183 lb 13.8 oz (83.4 kg) 185 lb 10 oz (84.2 kg)    Telemetry    Sinus rhythm with PVCs, rate was up to 110's, now in 60's - Personally Reviewed  ECG    No new tracing - Personally Reviewed  Physical Exam   GEN: No acute distress.   Neck: No JVD Cardiac: RRR, no murmurs, rubs, or gallops.  Respiratory: Expiratory wheezes throughout with decreased breath sounds GI: Soft, nontender,  non-distended  MS: No edema; No deformity. Neuro:  Nonfocal  Psych: Normal affect   Labs    Chemistry Recent Labs  Lab 02/12/17 1158 02/13/17 0941 02/14/17 0607 02/15/17 0711  NA  --  134* 136 137  K  --  3.6 2.9* 4.1  CL  --  103 99* 101  CO2  --  19* 22 23  GLUCOSE  --  153* 106* 141*  BUN  --  18 25* 26*  CREATININE  --  1.34* 1.46* 1.20  CALCIUM  --  8.5* 8.7* 8.7*  PROT 6.0*  --   --   --   ALBUMIN 2.7*  --   --   --   AST 34  --   --   --   ALT 15*  --   --   --   ALKPHOS 53  --   --   --   BILITOT 1.4*  --   --   --   GFRNONAA  --  48* 43* 54*  GFRAA  --  55* 50* >60  ANIONGAP  --  12 15 13      Hematology Recent Labs  Lab 02/13/17 0941 02/13/17 1852 02/14/17 0607 02/15/17 0711  WBC 10.9*  --  8.8 12.4*  RBC 2.69* 2.71* 2.64* 2.65*  HGB 8.2*  --  8.2* 8.2*  HCT 25.0*  --  24.9* 25.2*  MCV 92.9  --  94.3 95.1  MCH 30.5  --  31.1 30.9  MCHC 32.8  --  32.9 32.5  RDW 22.7*  --  22.6* 22.5*  PLT 73*  --  68* 65*    Cardiac Enzymes Recent Labs  Lab 02/10/17 1829 02/10/17 2320 02/11/17 0649  TROPONINI 1.31* 1.11* 0.48*    Recent Labs  Lab 02/10/17 1245 02/10/17 1538  TROPIPOC 0.70* 0.95*     BNPNo results for input(s): BNP, PROBNP in the last 168 hours.   DDimer  Recent Labs  Lab 02/12/17 1158  DDIMER 3.22*     Radiology    No results found.  Cardiac Studies   Echocardiogram 02/13/2017 Study Conclusions  - Left ventricle: The cavity size was normal. Wall thickness was   normal. Systolic function was moderately reduced. The estimated   ejection fraction was in the range of 35% to 40%. Diffuse   hypokinesis. - Mitral valve: There was mild to moderate regurgitation directed   centrally. - Left atrium: The atrium was mildly dilated. - Right ventricle: The cavity size was mildly dilated. Systolic   function was moderately reduced. - Right atrium: The atrium was mildly dilated. - Tricuspid valve: There was moderate  regurgitation. - Pulmonary arteries: Systolic pressure was mildly increased. PA   peak pressure: 40 mm Hg (S).  Patient Profile     84 y.o. male with history of CAD (s/p CABG in 80), AAA (s/p repair), DM, HTN Presented to [facility] with CP and SOB Trop elevated 1.3 Hgb 8.4 decreased to 7.  Assessment & Plan    1. Acute systolic heart failure -Volume improved. Symptoms improved. -Echo with EF 35-40% with diffuse hypokinesis -I&O with 2L uop in last 24 h. Net positive 142 ml since admission. Wt is up 5 lbs since admission, up 2 lbs since yesterday. Lasix is on hold. SCr improved from 1.46 yest to 1.20 today. K+ has improved from 2.9 yest to 4.1 today. -Pt is on meroprolol tartrate 100 mg daily with HR down to the 60's this am. Will switch to Toprol XL 100 mg.  - Would give additional alsix    2. CAD/NSTEMI -CAD s/p CABG (1986): possible demand ischemia contributed to elevation in setting of anemia.and CHF Currently CP free.  - Troponin peaked 1.31 > 0.48 - Echo with EF 35-40%, diffuse hypokinesis, dilated LA/RA - Given thrombocytopenia and anemia, he is not a candidate for antiplatelet therapy. Will avoid ASA and heparin.  For now, without pain and with heme problems would not pursure any invasive strategy   - Continue metoprolol, crestor - Can consider addition of ACE Inhibitor/ARB if Cr improves and BP allows  3. Atrial fibrillation  - EKG 02/13/17 with atrial fibrillation, rate 92 - This patients CHA2DS2-VASc Score and unadjusted Ischemic Stroke Rate (% per year) is equal to 9.7 % stroke rate/year from a score of 6 Above score calculated as 1 point each if present [CHF, HTN, DM, Vascular=MI/PAD/Aortic Plaque, Age if 65-74, or Male] Above score calculated as 2 points each if present [Age > 75, or Stroke/TIA/TE] -Not a candidate for anticoag given heme problems   - Continue metoprolol for now. Rate down to 60's with am dose of metoprolol. Tartrate. Will switch to Toprol XL.  4.  Anemia - s/p 1u PRBC - Hgb stable at 8.2  - Transfuse  for <8 given cardiac history -  WOuld not start anticoag  5. HTN: bp stable - Continue current regimen  6. DM: -On SSI per primary team.   7. Renal: baseline Cr 1-1.1   -SCr rose to 1.46 with diuresis. Lasix on hold. Today SCr 1.20, improved. - Continue to monitor Cr closely  8. Likely underlying COPD:  -Continues to have wheezing on exam and history of smoking - Continue management per primary team.     For questions or updates, please contact [place] Please consult [email or web address] for contact info under Cardiology/STEMI.      Signed, Daune Perch, NP  02/15/2017, 11:34 AM    Pt seen and examined  I agree with assessment as noted by Maryla Morrow above    On exam:  Lungs with wheezes, rhonchi  Cardiac Irreg irreg No S3  Ext with Tr edema  Recommendations With SOB and increased volume on exam would resume lasix  Cr has improved some  Will continue to follow

## 2017-02-15 NOTE — Progress Notes (Signed)
Pt just came back from walking w/ PT, increased HR 100-115, RR 40 but pt seems to be calming down.  Pt states he is starting to breath easier.

## 2017-02-16 ENCOUNTER — Inpatient Hospital Stay (HOSPITAL_COMMUNITY): Payer: Medicare Other

## 2017-02-16 DIAGNOSIS — A419 Sepsis, unspecified organism: Secondary | ICD-10-CM

## 2017-02-16 DIAGNOSIS — E43 Unspecified severe protein-calorie malnutrition: Secondary | ICD-10-CM

## 2017-02-16 LAB — CBC WITH DIFFERENTIAL/PLATELET
BAND NEUTROPHILS: 4 %
BASOS ABS: 0 10*3/uL (ref 0.0–0.1)
BASOS PCT: 0 %
BLASTS: 0 %
EOS ABS: 0 10*3/uL (ref 0.0–0.7)
Eosinophils Relative: 0 %
HEMATOCRIT: 26 % — AB (ref 39.0–52.0)
HEMOGLOBIN: 8.4 g/dL — AB (ref 13.0–17.0)
Lymphocytes Relative: 9 %
Lymphs Abs: 1 10*3/uL (ref 0.7–4.0)
MCH: 31 pg (ref 26.0–34.0)
MCHC: 32.3 g/dL (ref 30.0–36.0)
MCV: 95.9 fL (ref 78.0–100.0)
METAMYELOCYTES PCT: 2 %
Monocytes Absolute: 0.6 10*3/uL (ref 0.1–1.0)
Monocytes Relative: 5 %
Myelocytes: 0 %
Neutro Abs: 9.4 10*3/uL — ABNORMAL HIGH (ref 1.7–7.7)
Neutrophils Relative %: 80 %
PROMYELOCYTES ABS: 0 %
Platelets: 62 10*3/uL — ABNORMAL LOW (ref 150–400)
RBC: 2.71 MIL/uL — ABNORMAL LOW (ref 4.22–5.81)
RDW: 22.6 % — AB (ref 11.5–15.5)
WBC: 11 10*3/uL — ABNORMAL HIGH (ref 4.0–10.5)
nRBC: 0 /100 WBC

## 2017-02-16 LAB — COMPREHENSIVE METABOLIC PANEL
ALBUMIN: 2.8 g/dL — AB (ref 3.5–5.0)
ALK PHOS: 74 U/L (ref 38–126)
ALT: 22 U/L (ref 17–63)
ANION GAP: 12 (ref 5–15)
AST: 28 U/L (ref 15–41)
BUN: 27 mg/dL — AB (ref 6–20)
CO2: 26 mmol/L (ref 22–32)
Calcium: 9.2 mg/dL (ref 8.9–10.3)
Chloride: 99 mmol/L — ABNORMAL LOW (ref 101–111)
Creatinine, Ser: 1.09 mg/dL (ref 0.61–1.24)
GFR calc Af Amer: >60 mL/min (ref >60)
GFR calc non Af Amer: >60 mL/min (ref >60)
GLUCOSE: 184 mg/dL — AB (ref 65–99)
POTASSIUM: 3.5 mmol/L (ref 3.5–5.1)
SODIUM: 137 mmol/L (ref 135–145)
Total Bilirubin: 1 mg/dL (ref 0.3–1.2)
Total Protein: 6.7 g/dL (ref 6.5–8.1)

## 2017-02-16 LAB — GLUCOSE, CAPILLARY
GLUCOSE-CAPILLARY: 165 mg/dL — AB (ref 65–99)
GLUCOSE-CAPILLARY: 176 mg/dL — AB (ref 65–99)
Glucose-Capillary: 203 mg/dL — ABNORMAL HIGH (ref 65–99)
Glucose-Capillary: 216 mg/dL — ABNORMAL HIGH (ref 65–99)

## 2017-02-16 LAB — PHOSPHORUS: Phosphorus: 3.2 mg/dL (ref 2.5–4.6)

## 2017-02-16 LAB — MAGNESIUM: Magnesium: 2.2 mg/dL (ref 1.7–2.4)

## 2017-02-16 MED ORDER — METOPROLOL TARTRATE 12.5 MG HALF TABLET
12.5000 mg | ORAL_TABLET | Freq: Once | ORAL | Status: AC
  Administered 2017-02-16: 12.5 mg via ORAL
  Filled 2017-02-16: qty 1

## 2017-02-16 MED ORDER — IPRATROPIUM-ALBUTEROL 0.5-2.5 (3) MG/3ML IN SOLN
3.0000 mL | Freq: Once | RESPIRATORY_TRACT | Status: AC
  Administered 2017-02-16: 3 mL via RESPIRATORY_TRACT

## 2017-02-16 MED ORDER — FUROSEMIDE 10 MG/ML IJ SOLN
40.0000 mg | Freq: Two times a day (BID) | INTRAMUSCULAR | Status: DC
  Administered 2017-02-16: 40 mg via INTRAVENOUS
  Filled 2017-02-16: qty 4

## 2017-02-16 MED ORDER — FUROSEMIDE 10 MG/ML IJ SOLN
80.0000 mg | Freq: Two times a day (BID) | INTRAMUSCULAR | Status: DC
  Administered 2017-02-17 – 2017-02-21 (×9): 80 mg via INTRAVENOUS
  Filled 2017-02-16 (×9): qty 8

## 2017-02-16 MED ORDER — FUROSEMIDE 10 MG/ML IJ SOLN
40.0000 mg | Freq: Once | INTRAMUSCULAR | Status: AC
  Administered 2017-02-16: 40 mg via INTRAVENOUS
  Filled 2017-02-16: qty 4

## 2017-02-16 MED ORDER — FUROSEMIDE 10 MG/ML IJ SOLN: 40.0000 mg | Freq: Two times a day (BID) | INTRAMUSCULAR | Status: DC

## 2017-02-16 MED ORDER — METHYLPREDNISOLONE SODIUM SUCC 125 MG IJ SOLR
60.0000 mg | Freq: Every day | INTRAMUSCULAR | Status: DC
  Administered 2017-02-17: 60 mg via INTRAVENOUS
  Filled 2017-02-16: qty 2

## 2017-02-16 NOTE — Progress Notes (Signed)
Nurse tech stopped me in hall to inform me that 3E28 SATs were in the 70's and that his oxygen wasn't hooked back up after his breathing treatment given by another RT. Upon arrival, RN at bedside and had hooked up the patients water bottle with his 3 Lpm nasal cannula to the wall. Patients SATs 95% at this time, in no distress per patient.

## 2017-02-16 NOTE — Consult Note (Signed)
   [facility] Inpatient Consult   02/16/2017  Dillon Sweeney 28-Mar-1932 [id number]   Patient evaluated for [place] [employer] services.  Patient is not currently a beneficiary of the attributed [place] in the [employer].  Patient's current insurance listed as [employer] which is currently not a [employer].This patient is Not eligible for [facility] Care Management Services.   Reason:  Not a beneficiary currently attributed to one of the [place].  Membership roster was used to verify non- eligible status.  For questions,  Natividad Brood, RN BSN [facility] Liaison  [phone] business mobile phone Toll free office [phone]

## 2017-02-16 NOTE — Progress Notes (Signed)
Initial Nutrition Assessment  DOCUMENTATION CODES:   Severe malnutrition in context of chronic illness  INTERVENTION:   -Ensure Enlive po daily, each supplement provides 350 kcal and 20 grams of protein -30 ml Prostat BID, each supplement provide 100 kcals and 15 grams protein -MVI daily   NUTRITION DIAGNOSIS:   Severe Malnutrition related to chronic illness(CAD) as evidenced by energy intake < 75% for > or equal to 1 month, severe muscle depletion.  GOAL:   Patient will meet greater than or equal to 90% of their needs  MONITOR:   PO intake, Supplement acceptance, Labs, Weight trends, Skin, I & O's  REASON FOR ASSESSMENT:   Consult Assessment of nutrition requirement/status  ASSESSMENT:   Dillon Sweeney is a 84 y.o. man admitted on 1/11 after visiting his PCP for shortness of breath and being referred to the hospital  Pt admitted with chest pain.   Spoke with pt at bedside, who reports he eats very little at baseline. He shares that PTA he usually consumes 2 meals per day (Breakfast: soft scrambled eggs, Dinner: meatloaf, spaghetti, or broccoli, [place], and pie from [employer]). Pt shares that he has not been eating much since being in the hospital; noted meal completion 45-100%. Observed meal tray- pt consumed only 1/3 of fruit cocktail, 75% of pits wedges, and a few bites of tuna salad. He states "I don't need much because I don't move around much").   Pt shares UBW is around 180#. He estimates a 10# wt loss within the past 3 weeks, which he attributes to diuretics and poor appetite. Reviewed wt hx, pt has experienced a 11% wt loss over the past year.   Discussed with pt importance of good nutritional intake to promote healing. Pt has not tried supplements before, but willing to try with RD encouragement.   Labs reviewed: CBGS: 165-181 (inpatient orders for glycemic control are 0-9 units insulin aspart TID with meals).   NUTRITION - FOCUSED PHYSICAL EXAM:    Most Recent Value   Orbital Region  Severe depletion  Upper Arm Region  Moderate depletion  Thoracic and Lumbar Region  Mild depletion  Buccal Region  Moderate depletion  Temple Region  Severe depletion  Clavicle Bone Region  Moderate depletion  Clavicle and Acromion Bone Region  Severe depletion  Scapular Bone Region  Severe depletion  Dorsal Hand  Moderate depletion  Patellar Region  Moderate depletion  Anterior Thigh Region  Moderate depletion  Posterior Calf Region  Moderate depletion  Edema (RD Assessment)  Mild  Hair  Reviewed  Eyes  Reviewed  Mouth  Reviewed  Skin  Reviewed  Nails  Reviewed       Diet Order:  Diet heart healthy/carb modified Room service appropriate? Yes; Fluid consistency: Thin  EDUCATION NEEDS:   Education needs have been addressed  Skin:  Skin Assessment: Reviewed RN Assessment  Last BM:  02/13/17  Height:   Ht Readings from Last 1 Encounters:  02/11/17 5\' 9"  (1.753 m)    Weight:   Wt Readings from Last 1 Encounters:  02/16/17 170 lb 1.6 oz (77.2 kg)    Ideal Body Weight:  72.7 kg  BMI:  Body mass index is 25.12 kg/m.  Estimated Nutritional Needs:   Kcal:  1700-1900  Protein:  95-110 grams  Fluid:  1.7-1.9 L    Alika Eppes A. Jimmye Norman, RD, LDN, CDE Pager: [phone] After hours Pager: [phone]

## 2017-02-16 NOTE — Progress Notes (Signed)
SATURATION QUALIFICATIONS: (This note is used to comply with regulatory documentation for home oxygen)  Patient Saturations on Room Air at Rest = 79%

## 2017-02-16 NOTE — Progress Notes (Signed)
Progress Note  Patient Name: Dillon Sweeney Date of Encounter: 02/16/2017  Primary Cardiologist: Dorris Carnes, MD -[place]. Wants to follow up in [place].  Subjective   Pt coughing more, breathing no better. Wheezing as well.  Inpatient Medications    Scheduled Meds: . amoxicillin-clavulanate  1 tablet Oral Q12H  . furosemide  40 mg Oral BID  . guaiFENesin  1,200 mg Oral BID  . insulin aspart  0-9 Units Subcutaneous TID WC  . levalbuterol  0.63 mg Nebulization TID  . levothyroxine  137 mcg Oral QAC breakfast  . methylPREDNISolone (SOLU-MEDROL) injection  60 mg Intravenous Q12H  . metoprolol succinate  100 mg Oral Daily  . mometasone-formoterol  2 puff Inhalation BID  . pantoprazole  40 mg Oral Daily  . rosuvastatin  40 mg Oral q1800   Continuous Infusions: . sodium chloride     PRN Meds: acetaminophen, benzonatate, haloperidol lactate, ondansetron **OR** ondansetron (ZOFRAN) IV   Vital Signs    Vitals:   02/15/17 2026 02/16/17 0501 02/16/17 0922 02/16/17 0926  BP:  (!) 124/53    Pulse:  78    Resp:  20    Temp:  (!) 97.5 F (36.4 C)    TempSrc:  Oral    SpO2: 95% 95% 97% 97%  Weight:  170 lb 1.6 oz (77.2 kg)    Height:        Intake/Output Summary (Last 24 hours) at 02/16/2017 0951 Last data filed at 02/15/2017 1841 Gross per 24 hour  Intake 420 ml  Output 0 ml  Net 420 ml   Filed Weights   02/14/17 0500 02/15/17 0431 02/16/17 0501  Weight: 183 lb 13.8 oz (83.4 kg) 185 lb 10 oz (84.2 kg) 170 lb 1.6 oz (77.2 kg)    Telemetry    SR - Personally Reviewed  ECG    No new tracing - Personally Reviewed  Physical Exam   GEN: No acute distress.   Neck: JVD 9 cm Cardiac: RRR, no murmurs, rubs, or gallops.  Respiratory: Exp wheeze, rales and rhonchi, mod labored GI: +BS, soft, nontender MS: no edema, pulses intact Neuro:  no focal deficits noted Psych: nl affect  Labs    Chemistry Recent Labs  Lab 02/12/17 1158  02/14/17 0607 02/15/17 0711  02/16/17 0608  NA  --    < > 136 137 137  K  --    < > 2.9* 4.1 3.5  CL  --    < > 99* 101 99*  CO2  --    < > 22 23 26   GLUCOSE  --    < > 106* 141* 184*  BUN  --    < > 25* 26* 27*  CREATININE  --    < > 1.46* 1.20 1.09  CALCIUM  --    < > 8.7* 8.7* 9.2  PROT 6.0*  --   --   --  6.7  ALBUMIN 2.7*  --   --   --  2.8*  AST 34  --   --   --  28  ALT 15*  --   --   --  22  ALKPHOS 53  --   --   --  74  BILITOT 1.4*  --   --   --  1.0  GFRNONAA  --    < > 43* 54* >60  GFRAA  --    < > 50* >60 >60  ANIONGAP  --    < >  15 13 12    < > = values in this interval not displayed.     Hematology Recent Labs  Lab 02/14/17 0607 02/15/17 0711 02/16/17 0608  WBC 8.8 12.4* 11.0*  RBC 2.64* 2.65* 2.71*  HGB 8.2* 8.2* 8.4*  HCT 24.9* 25.2* 26.0*  MCV 94.3 95.1 95.9  MCH 31.1 30.9 31.0  MCHC 32.9 32.5 32.3  RDW 22.6* 22.5* 22.6*  PLT 68* 65* 62*    Cardiac Enzymes Recent Labs  Lab 02/10/17 1829 02/10/17 2320 02/11/17 0649  TROPONINI 1.31* 1.11* 0.48*    Recent Labs  Lab 02/10/17 1245 02/10/17 1538  TROPIPOC 0.70* 0.95*     DDimer  Recent Labs  Lab 02/12/17 1158  DDIMER 3.22*     Radiology    Dg Chest Port 1 View  Result Date: 02/16/2017 CLINICAL DATA:  Shortness of Breath EXAM: PORTABLE CHEST 1 VIEW COMPARISON:  February 13, 2017 FINDINGS: Small pleural effusions with bibasilar atelectasis remain. There is no consolidation. Heart is enlarged with pulmonary vascularity within normal limits. No adenopathy. There is aortic atherosclerosis. Patient is status post coronary artery bypass grafting. No bone lesions evident. IMPRESSION: Small pleural effusions and bibasilar atelectasis, stable. Stable cardiomegaly. No new opacity. No change in cardiac silhouette. There is aortic atherosclerosis. Aortic Atherosclerosis (ICD10-I70.0). Electronically Signed   By: Lowella Grip III M.D.   On: 02/16/2017 09:26    Cardiac Studies   Echocardiogram 02/13/2017 Study  Conclusions  - Left ventricle: The cavity size was normal. Wall thickness was   normal. Systolic function was moderately reduced. The estimated   ejection fraction was in the range of 35% to 40%. Diffuse   hypokinesis. - Mitral valve: There was mild to moderate regurgitation directed   centrally. - Left atrium: The atrium was mildly dilated. - Right ventricle: The cavity size was mildly dilated. Systolic   function was moderately reduced. - Right atrium: The atrium was mildly dilated. - Tricuspid valve: There was moderate regurgitation. - Pulmonary arteries: Systolic pressure was mildly increased. PA   peak pressure: 40 mm Hg (S).  Patient Profile     84 y.o. male with history of CAD (s/p CABG in 57), AAA (s/p repair), DM, HTN Presented to [facility] with CP and SOB Trop elevated 1.3 Hgb 8.4 decreased to 7.  Assessment & Plan    1. Acute systolic heart failure - wt reportedly down 15 lbs from 01/16 but sx are worse - EF 35-40% w/ diffuse HK - I/O net +622 cc since admit, however no output charted for 01/16 - hypokalemia improved - change Lasix 40 mg IV bid  - on BB, no spiro, ACE/ARB for now, may be Entresto candidate  2. CAD/NSTEMI -CAD s/p CABG (1986):  - may be multi-factorial demand ischemia 2nd anemia, CHF - currently no ischemic sx.  - Troponin peaked 1.31 > 0.48 - Echo with EF 35-40%, diffuse hypokinesis, dilated LA/RA - no ASA, other anticoagulation 2nd anemia and thrombocytopenia - no invasive wkup planned - on BB, statin - Continue metoprolol, crestor  3. Atrial fibrillation  - EKG 02/13/17 with atrial fibrillation, rate 92 - This patients CHA2DS2-VASc Score and unadjusted Ischemic Stroke Rate (% per year) is equal to 9.7 % stroke rate/year from a score of 6 Above score calculated as 1 point each if present [CHF, HTN, DM, Vascular=MI/PAD/Aortic Plaque, Age if 65-74, or Male] Above score calculated as 2 points each if present [Age > 75, or Stroke/TIA/TE] - not  currently anticoag candidate - On BB  4. Anemia - s/p 1u PRBC - H&H low but stable - continue to follow, transfuse for < 8  5. HTN:  - good control on current rx  6. DM: - per IM  7. Renal:   -  Peak Cr 1.46, improved. - Cr 1.2 today, BUN trending up slightly. - follow closely on IV Lasix  8. Likely underlying COPD:  - on nebs, steroids, ABX - per IM    For questions or updates, please contact [place] Please consult [email or web address] for contact info under Cardiology/STEMI.      Signed, Rosaria Ferries, PA-C  02/16/2017, 9:51 AM    Pt seen and examined   I agree with findingas as noted by R Barrett.  Pt remains with sx of volume overload Now on 4L [place] with sats in low 90s Lungs with diffuse rhonchi and wheeze  Cardiac exam:  Irreg irreg  No S3   Ext with triv edema  I would contine IV lasix  Increase to 80 mg bid   Follow BMET and strict in/out   Dorris Carnes

## 2017-02-16 NOTE — Progress Notes (Signed)
PROGRESS NOTE    Dillon Sweeney  PJK:[id number] DOB: [birth year] DOA: 02/10/2017 PCP: Eustaquio Maize, MD   Brief Narrative:  Dillon Sweeney is a 84 y.o. man admitted on 1/11 after visiting his PCP for shortness of breath and being referred to the hospital. 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 Also has a history of AAA, diabetes, hypertension, hypothyroidism. In the ED he also complained of chest pain that he described as pressure-like radiating to both arms. In the ED he was found to meet SIRS criteria and was given broad-spectrum antibiotics. Because of elevated troponins with a high of 1.31, Cardiology was consulted overnight who recommended transfer to [facility] for further evaluation. However due to lack of bed availability patient remains in the emergencydepartment at [facility]. Admitted for Acute Respiratory Failure with Hypoxia, Acute Systolic CHF Exacerbation, and Dyspnea. Currently being diuresed and treated for Aspiration PNA.  Assessment & Plan:   Principal Problem:   Chest pain Active Problems:   Abdominal aortic aneurysm (HCC)   Diabetes (HCC)   HTN (hypertension)   Tobacco use disorder   Myocardial infarction   Lung nodule   Shortness of breath   Troponin level elevated   Thrombocytopenia (HCC)   Normocytic anemia   Acute on chronic systolic heart failure (HCC)  Acute Respiratory Failure with Hypoxia -Multifactorial -Patient qualifies for 3 Liters of O2 given Walk screen -C/w Diuresis per Cards; po Lasix changed to IV Lasix 40 mg BIX -Started IV Solumedrol 60 mg q12h and will wean to 60 mg Daily  Chest Pain -Resolved.  -Concern for NSTEMI however troponin trended down. -Cardiology consulted and assessment is this is likely related to anemia and Type 2 Nstemi.  -Recommending stress test as an outpatient.  Anemia andThrombocytopenia -Unknown etiology.  -Patient without history of GI bleeding.    -Per chart review, patient has had chronic anemia up until 2012.  -Last hemoglobin prior to admission was about one year prior. -History of colonoscopy within a few years with unknown results per patient. S/p 1 unit of PRBC since admission. FOBT negative. - DIC panel abnormal with elevated PT/PTT, fibrinogen and d-dimer. LDH elevated with slightly elevated total/indirect bilirubin. Unknown if this is chronic. Haptoglobin elevated. -Direct coombs negative. Hematology recommending supportive care with Transfusions  -Hb stable at 8.4 Platelet Count was 62 -Hematology Recc's  appreciated  -Repeat CBC in AM   Wheezing in the setting of Undiagnosed COPD -Likely COPD, but patient does not have official diagnosis. Quit smoking three months ago. -Xopenx q 6 hrs -Continue Dulera -Changed prednisone to IV Solumedrol and now will Decrease Solumedrol Dose -Mucinex   Dyspnea, improving  -Combination of bronchitis/COPD and pulmonary edema and Aspiration PNA. Resolved. -Cardiology starting scheduled diuresis and it was held because of AKI; Now resumed IV  -Repeat CXR as above -May consider CT Scan of Chest if continues to have Dyspnea even after adequate Diuresis.   Acute Systolic Heart Failure -Unsure if this is chronic. EF of 35-40% with diffuse hypokinesis on echocardiogram from 1/14.  -Patient presented with concern for Nstemi. -Cardiology recommendations appreciated -Cardiology changed patient to Metoprolol Succinate 100 mg po Daily  -Restarted po Lasix 40 mg po BID and Cardiology changed to IV Lasix today  -Strict I's/O's, Daily Weights, and SLIV -Patient is +127.8 mL and Weight is Up -10 Lbs but ? Accuracy -Per Cardiology may be a candidate for [facility] eventually  Acute Kidney Injury, improved  -Likely secondary to diuresis. -Recommendeed to hold lasix but will now resume at 40 mg po BID now that Cr is improved; Cardiology changed to IV 40 mg BID -Repeat CMP in AM   Atrial  Fibrillation  -Likely rebound from decrease in metoprolol. EKG suggests atrial fibrillation. Rate better controlled. -Continue Metoprolol but changed to Metoprolol Succinate by Cardiology  -Cardiology recommendations appreciated. Per Cards patient is not a candidate for Anticoagulation given recent Problems with GIB   SIRS from PNA  -Criteria met on admission. No source initially but now with evidence of aspiration pneumonia.  -Given empiric antibiotics. Blood culture with no growth x3 days.  -Urine culture without growth. Chest x-ray suggesting pneumonia. -C/w Augmentin -SLP Recommending Regular Diet with Thin Liquids   Aspiration Pneumonia -Suggested on Chest x-ray earlier on Admission. -C/w Augmentin -C/w Mucinex and Benzonatate -C/w Xopenex 0.63 mg Neb TID and Dulera -Started IV Solumedrol 60 mg q12h and changed to IV Solumedrol 60 mg Daily in AM -Repeat CXR showed Small pleural effusions and bibasilar atelectasis, stable. Stable cardiomegaly. No new opacity. No change in cardiac silhouette. There is aortic atherosclerosis.  Lung Nodule -Incidental. Outpatient follow-up with repeat chest CT in 3 months  Diabetes Mellitus -C/w Sensitive Novolog SSI AC -CBG's ranging from 165-203  Delirium -Appears to have resolved. Unknown etiology. Patient has underlying cognitive impairment per colleague discussion with wife.  -CT head negative for hematoma.  -Delirium Precautions  -Appears to have resolved.  Hypothyroidism -Low TSH. -Decreased Synthroid dose to 137 mcg daily  Severe Malnutrition in the Context of Chronic illness -Nutritionist consulted and appreciate Recc's -C/w Ensure Enlive po daily and 30 ml Prostat BID -C/w MVI daily   DVT prophylaxis: SCDs given recent Anemia and Hx of GIB Code Status: FULL CODE Family Communication: No Family present at bedside Disposition Plan: Home Health PT when medically stable at D/C  Consultants:    Cardiology  Hematology/Oncology  Procedures:  ECHOCARDIOGRAM ------------------------------------------------------------------- Study Conclusions  - Left ventricle: The cavity size was normal. Wall thickness was   normal. Systolic function was moderately reduced. The estimated   ejection fraction was in the range of 35% to 40%. Diffuse   hypokinesis. - Mitral valve: There was mild to moderate regurgitation directed   centrally. - Left atrium: The atrium was mildly dilated. - Right ventricle: The cavity size was mildly dilated. Systolic   function was moderately reduced. - Right atrium: The atrium was mildly dilated. - Tricuspid valve: There was moderate regurgitation. - Pulmonary arteries: Systolic pressure was mildly increased. PA   peak pressure: 40 mm Hg (S).  Antimicrobials:  Anti-infectives (From admission, onward)   Start     Dose/Rate Route Frequency Ordered Stop   02/14/17 1000  amoxicillin-clavulanate (AUGMENTIN) 875-125 MG per tablet 1 tablet     1 tablet Oral Every 12 hours 02/14/17 0716     02/11/17 2200  piperacillin-tazobactam (ZOSYN) IVPB 3.375 g  Status:  Discontinued     3.375 g 12.5 mL/hr over 240 Minutes Intravenous Every 8 hours 02/11/17 1633 02/12/17 1022   02/11/17 0800  vancomycin (VANCOCIN) IVPB 750 mg/150 ml premix  Status:  Discontinued     750 mg 150 mL/hr over 60 Minutes Intravenous Every 12 hours 02/10/17 2035 02/11/17 1436   02/10/17 2200  piperacillin-tazobactam (ZOSYN) IVPB 3.375 g  Status:  Discontinued     3.375 g 12.5 mL/hr over 240 Minutes Intravenous Every 8 hours 02/10/17 2031 02/11/17 1436   02/10/17 1900  vancomycin (VANCOCIN) 1,500 mg in sodium chloride 0.9 % 500 mL IVPB     1,500 mg 250 mL/hr over 120 Minutes Intravenous  Once 02/10/17 1834 02/10/17 2120   02/10/17 1445  cefTRIAXone (ROCEPHIN) 1 g in dextrose 5 % 50 mL IVPB  Status:  Discontinued     1 g 100 mL/hr over 30 Minutes Intravenous  Once 02/10/17 1441 02/10/17 1444    02/10/17 1445  piperacillin-tazobactam (ZOSYN) IVPB 3.375 g     3.375 g 100 mL/hr over 30 Minutes Intravenous  Once 02/10/17 1444 02/10/17 1553     Subjective: Seen and examined at bedside and states SOB was improved after breathing treatments. No CP But thinks Lasix helps. No nausea or vomiting. No other complaints or concerns at this time.   Objective: Vitals:   02/15/17 1157 02/15/17 1954 02/15/17 2026 02/16/17 0501  BP: 106/60   (!) 124/53  Pulse: 69 69  78  Resp: '20 20  20  '$ Temp: 98.1 F (36.7 C) 98.1 F (36.7 C)  (!) 97.5 F (36.4 C)  TempSrc: Oral Oral  Oral  SpO2: 95% 96% 95% 95%  Weight:    77.2 kg (170 lb 1.6 oz)  Height:        Intake/Output Summary (Last 24 hours) at 02/16/2017 0759 Last data filed at 02/15/2017 1841 Gross per 24 hour  Intake 540 ml  Output 0 ml  Net 540 ml   Filed Weights   02/14/17 0500 02/15/17 0431 02/16/17 0501  Weight: 83.4 kg (183 lb 13.8 oz) 84.2 kg (185 lb 10 oz) 77.2 kg (170 lb 1.6 oz)   Examination: Physical Exam:  Constitutional: WN/WD Caucasian male in NAD; Appears calm  Eyes: Sclerae anicteric; Lids normal ENMT: External Ears and nose appear normal. MMM Neck: Supple with no JVD Respiratory: Diminished Breath sounds and unlabored breathing but wearing supplemental O2. Has some mild crackles Cardiovascular: Irregularly irregular. Has 1+ LE edema Abdomen: Soft, NT, ND. Bowel sounds present GU: Deferred Musculoskeletal: No contractures; No cyanosis Skin: Warm and dry. No appreciable rashes or lesions Neurologic: CN 2-12 grossly intact; No appreciable focal deficits Psychiatric: Normal mood and affect. Intact judgment and insight  Data Reviewed: I have personally reviewed following labs and imaging studies  CBC: Recent Labs  Lab 02/10/17 1229  02/12/17 0924 02/12/17 1158 02/13/17 0941 02/14/17 0607 02/15/17 0711 02/16/17 0608  WBC 14.5*   < > 9.0  --  10.9* 8.8 12.4* 11.0*  NEUTROABS 12.9*  --   --   --   --   --    --  PENDING  HGB 8.4*   < > 8.1*  --  8.2* 8.2* 8.2* 8.4*  HCT 25.7*   < > 24.6*  --  25.0* 24.9* 25.2* 26.0*  MCV 97.3   < > 93.5  --  92.9 94.3 95.1 95.9  PLT 72*   < > 65* 58* 73* 68* 65* 62*   < > = values in this interval not displayed.   Basic Metabolic Panel: Recent Labs  Lab 02/11/17 1548 02/13/17 0941 02/14/17 0607 02/15/17 0711 02/16/17 0608  NA 131* 134* 136 137 137  K 3.6 3.6 2.9* 4.1 3.5  CL 101 103 99* 101 99*  CO2 18* 19* '22 23 26  '$ GLUCOSE 150* 153* 106* 141* 184*  BUN 14 18 25* 26* 27*  CREATININE 1.14 1.34* 1.46* 1.20 1.09  CALCIUM 8.6* 8.5* 8.7* 8.7* 9.2  MG  --   --  2.1  --  2.2  PHOS  --   --   --   --  3.2   GFR: Estimated Creatinine Clearance: 52.3 mL/min (by C-G formula based on SCr of 1.09 mg/dL). Liver Function Tests: Recent Labs  Lab 02/12/17 1158 02/16/17 0608  AST 34 28  ALT 15* 22  ALKPHOS 53 74  BILITOT 1.4* 1.0  PROT 6.0* 6.7  ALBUMIN 2.7* 2.8*   No results for input(s): LIPASE, AMYLASE in the last 168 hours. No results for input(s): AMMONIA in the last 168 hours. Coagulation Profile: Recent Labs  Lab 02/12/17 1158  INR 1.31   Cardiac Enzymes: Recent Labs  Lab 02/10/17 1829 02/10/17 2320 02/11/17 0649  TROPONINI 1.31* 1.11* 0.48*   BNP (last 3 results) No results for input(s): PROBNP in the last 8760 hours. HbA1C: No results for input(s): HGBA1C in the last 72 hours. CBG: Recent Labs  Lab 02/15/17 0734 02/15/17 1130 02/15/17 1614 02/15/17 2125 02/16/17 0755  GLUCAP 127* 149* 233* 181* 165*   Lipid Profile: No results for input(s): CHOL, HDL, LDLCALC, TRIG, CHOLHDL, LDLDIRECT in the last 72 hours. Thyroid Function Tests: No results for input(s): TSH, T4TOTAL, FREET4, T3FREE, THYROIDAB in the last 72 hours. Anemia Panel: Recent Labs    02/13/17 1852  VITAMINB12 1,000*  FOLATE 11.2  FERRITIN 630*  TIBC 234*  IRON 24*  RETICCTPCT 1.6   Sepsis Labs: Recent Labs  Lab 02/10/17 1300 02/10/17 1517  02/10/17 1829  LATICACIDVEN 2.74* 2.3* 1.9    Recent Results (from the past 240 hour(s))  Urine culture     Status: None   Collection Time: 02/10/17 12:46 PM  Result Value Ref Range Status   Specimen Description URINE, CATHETERIZED  Final   Special Requests NONE  Final   Culture   Final    NO GROWTH Performed at [facility], [place] [address], [place], [place] [address]    Report Status 02/12/2017 FINAL  Final  Blood culture (routine x 2)     Status: None   Collection Time: 02/10/17  3:25 PM  Result Value Ref Range Status   Specimen Description BLOOD RIGHT FOREARM  Final   Special Requests   Final    BOTTLES DRAWN AEROBIC AND ANAEROBIC Blood Culture adequate volume   Culture NO GROWTH 5 DAYS  Final   Report Status 02/15/2017 FINAL  Final  Blood culture (routine x 2)     Status: None   Collection Time: 02/10/17  3:37 PM  Result Value Ref Range Status   Specimen Description LEFT ANTECUBITAL  Final   Special Requests   Final    BOTTLES DRAWN AEROBIC AND ANAEROBIC Blood Culture adequate volume   Culture NO GROWTH 5 DAYS  Final   Report Status 02/15/2017 FINAL  Final  MRSA PCR Screening     Status: None   Collection Time: 02/14/17  7:49 AM  Result Value Ref Range Status   MRSA by PCR NEGATIVE NEGATIVE Final    Comment:        The GeneXpert MRSA Assay (FDA approved for NASAL specimens only), is one component of a comprehensive MRSA colonization surveillance program. It is not intended to diagnose MRSA infection nor to guide or monitor treatment for MRSA infections.     Radiology Studies: No results found.  Scheduled Meds: . amoxicillin-clavulanate  1 tablet Oral Q12H  . furosemide  40 mg Oral BID  . guaiFENesin  1,200 mg Oral BID  . insulin aspart  0-9 Units Subcutaneous TID WC  . levalbuterol  0.63 mg Nebulization TID  . levothyroxine  137 mcg Oral QAC breakfast  . methylPREDNISolone (SOLU-MEDROL) injection  60 mg Intravenous Q12H  . metoprolol succinate  100  mg Oral Daily  . mometasone-formoterol  2 puff Inhalation BID  . pantoprazole  40 mg Oral Daily  . rosuvastatin  40 mg Oral q1800   Continuous Infusions: . sodium chloride      LOS: 6 days   Kerney Elbe, DO [facility] Pager [phone]  If 7PM-7AM, please contact night-coverage [email or web address] Password [email or web address] 02/16/2017, 7:59 AM

## 2017-02-17 ENCOUNTER — Inpatient Hospital Stay (HOSPITAL_COMMUNITY): Payer: Medicare Other

## 2017-02-17 LAB — CBC
HCT: 27.5 % — ABNORMAL LOW (ref 39.0–52.0)
HEMOGLOBIN: 9 g/dL — AB (ref 13.0–17.0)
MCH: 31.5 pg (ref 26.0–34.0)
MCHC: 32.7 g/dL (ref 30.0–36.0)
MCV: 96.2 fL (ref 78.0–100.0)
PLATELETS: 59 10*3/uL — AB (ref 150–400)
RBC: 2.86 MIL/uL — AB (ref 4.22–5.81)
RDW: 22.3 % — ABNORMAL HIGH (ref 11.5–15.5)
WBC: 13.3 10*3/uL — ABNORMAL HIGH (ref 4.0–10.5)

## 2017-02-17 LAB — COMPREHENSIVE METABOLIC PANEL
ALK PHOS: 76 U/L (ref 38–126)
ALT: 20 U/L (ref 17–63)
AST: 25 U/L (ref 15–41)
Albumin: 2.6 g/dL — ABNORMAL LOW (ref 3.5–5.0)
Anion gap: 13 (ref 5–15)
BILIRUBIN TOTAL: 1.1 mg/dL (ref 0.3–1.2)
BUN: 23 mg/dL — AB (ref 6–20)
CALCIUM: 8.9 mg/dL (ref 8.9–10.3)
CO2: 32 mmol/L (ref 22–32)
CREATININE: 1.21 mg/dL (ref 0.61–1.24)
Chloride: 94 mmol/L — ABNORMAL LOW (ref 101–111)
GFR calc Af Amer: >60 mL/min (ref >60)
GFR, EST NON AFRICAN AMERICAN: 54 mL/min — AB (ref >60)
Glucose, Bld: 131 mg/dL — ABNORMAL HIGH (ref 65–99)
POTASSIUM: 3.3 mmol/L — AB (ref 3.5–5.1)
Sodium: 139 mmol/L (ref 135–145)
TOTAL PROTEIN: 6.3 g/dL — AB (ref 6.5–8.1)

## 2017-02-17 LAB — CBC WITH DIFFERENTIAL/PLATELET
BASOS PCT: 0 %
Band Neutrophils: 2 %
Basophils Absolute: 0 10*3/uL (ref 0.0–0.1)
Eosinophils Absolute: 0 10*3/uL (ref 0.0–0.7)
Eosinophils Relative: 0 %
HCT: 26.4 % — ABNORMAL LOW (ref 39.0–52.0)
HEMOGLOBIN: 8.7 g/dL — AB (ref 13.0–17.0)
Lymphocytes Relative: 5 %
Lymphs Abs: 0.6 10*3/uL — ABNORMAL LOW (ref 0.7–4.0)
MCH: 31.6 pg (ref 26.0–34.0)
MCHC: 33 g/dL (ref 30.0–36.0)
MCV: 96 fL (ref 78.0–100.0)
METAMYELOCYTES PCT: 1 %
MONO ABS: 0.3 10*3/uL (ref 0.1–1.0)
Monocytes Relative: 3 %
NEUTROS ABS: 10.5 10*3/uL — AB (ref 1.7–7.7)
NEUTROS PCT: 89 %
Platelets: 62 10*3/uL — ABNORMAL LOW (ref 150–400)
RBC: 2.75 MIL/uL — ABNORMAL LOW (ref 4.22–5.81)
RDW: 22.3 % — ABNORMAL HIGH (ref 11.5–15.5)
WBC: 11.4 10*3/uL — AB (ref 4.0–10.5)
nRBC: 1 /100 WBC — ABNORMAL HIGH

## 2017-02-17 LAB — GLUCOSE, CAPILLARY
GLUCOSE-CAPILLARY: 143 mg/dL — AB (ref 65–99)
GLUCOSE-CAPILLARY: 210 mg/dL — AB (ref 65–99)
Glucose-Capillary: 123 mg/dL — ABNORMAL HIGH (ref 65–99)
Glucose-Capillary: 164 mg/dL — ABNORMAL HIGH (ref 65–99)

## 2017-02-17 LAB — BLOOD GAS, ARTERIAL
ACID-BASE EXCESS: 11.7 mmol/L — AB (ref 0.0–2.0)
BICARBONATE: 35.7 mmol/L — AB (ref 20.0–28.0)
DRAWN BY: 521601
Delivery systems: POSITIVE
Expiratory PAP: 5
FIO2: 40
Inspiratory PAP: 15
O2 Saturation: 96.4 %
PATIENT TEMPERATURE: 98.6
PO2 ART: 87.2 mmHg (ref 83.0–108.0)
pCO2 arterial: 46.1 mmHg (ref 32.0–48.0)
pH, Arterial: 7.501 — ABNORMAL HIGH (ref 7.350–7.450)

## 2017-02-17 LAB — MAGNESIUM: MAGNESIUM: 2.1 mg/dL (ref 1.7–2.4)

## 2017-02-17 LAB — PHOSPHORUS: Phosphorus: 3.1 mg/dL (ref 2.5–4.6)

## 2017-02-17 MED ORDER — LEVALBUTEROL HCL 0.63 MG/3ML IN NEBU
0.6300 mg | INHALATION_SOLUTION | Freq: Four times a day (QID) | RESPIRATORY_TRACT | Status: DC | PRN
  Administered 2017-02-19: 0.63 mg via RESPIRATORY_TRACT
  Filled 2017-02-17: qty 3

## 2017-02-17 MED ORDER — POTASSIUM CHLORIDE CRYS ER 20 MEQ PO TBCR
40.0000 meq | EXTENDED_RELEASE_TABLET | Freq: Two times a day (BID) | ORAL | Status: AC
  Administered 2017-02-17 (×2): 40 meq via ORAL
  Filled 2017-02-17 (×2): qty 2

## 2017-02-17 MED ORDER — METHYLPREDNISOLONE SODIUM SUCC 40 MG IJ SOLR
40.0000 mg | Freq: Every day | INTRAMUSCULAR | Status: DC
  Administered 2017-02-18: 40 mg via INTRAVENOUS
  Filled 2017-02-17: qty 1

## 2017-02-17 NOTE — Progress Notes (Signed)
PT Cancellation Note  Patient Details Name: Dillon Sweeney MRN: [id number] DOB: March 30, 1932   Cancelled Treatment:    Reason Eval/Treat Not Completed: Patient not medically ready. Rapid response called for respiratory distress; pt placed on BiPAP and transferred from 3E to [place]. Will follow-up for PT treatment session when appropriate as time allows.  Mabeline Caras, PT, DPT Acute Rehab Services  Pager: [place] 02/17/2017, 11:03 AM

## 2017-02-17 NOTE — Progress Notes (Signed)
Received a call from Central telemetry that patient experienced SVT heart rate 175. Notified on call MD. Orders were given and followed. Will continue to monitor patient for safety.

## 2017-02-17 NOTE — Progress Notes (Signed)
HEMATOLOGY Did not see the patient Reviewed chart Lab Review CBC Latest Ref Rng & Units 02/17/2017 02/16/2017 02/15/2017  WBC 4.0 - 10.5 K/uL 11.4(H) 11.0(H) 12.4(H)  Hemoglobin 13.0 - 17.0 g/dL 8.7(L) 8.4(L) 8.2(L)  Hematocrit 39.0 - 52.0 % 26.4(L) 26.0(L) 25.2(L)  Platelets 150 - 400 K/uL 62(L) 62(L) 65(L)    Thrombocytopenia: Stable Severe Anemia: Slowly improving Continue supportive care.

## 2017-02-17 NOTE — Progress Notes (Signed)
Pt taken off onf NIV for oral care and to give a break.  RR upper 20s, pt states breathing much better.  SpO2 95-97% on 8L HFNC.  Return to NIV for any increase  In WOB or pt distress.  Provided Flutter 2' scattered rhonchi  while off of NIVi.

## 2017-02-17 NOTE — Significant Event (Signed)
Rapid Response Event Note  Overview: Time Called: 6606 Arrival Time: 0045 Event Type: Respiratory  Initial Focused Assessment: Patient with labored breathing, some shortness of breath. He has become mildly confused over night  Lung sounds Rhonchi and crackles  BP 133/51  SR 91  RR 36  O2 sat 97% on 3L [place]  Dr Harrington Challenger and Suanne Marker at bedside Wife at bedside,  Spoke with MDs Dr Alfredia Ferguson to bedside   Interventions: Placed patient on [place] done  Patient very relaxed on Bipap Transferred to 2c11 via bed with O2 and heart monitor. RN at bedside to receive patient   Plan of Care (if not transferred):  Event Summary: Name of Physician Notified: Dr Alfredia Ferguson at [phone]  Name of Consulting Physician Notified: Dr Ross/ Rosaria Ferries at bedside at    Outcome: Transferred (Comment)(2c11)  Event End Time: [place]  Raliegh Ip

## 2017-02-17 NOTE — Progress Notes (Signed)
Cardiology at bedside to see patient. Rapid response called due to patient being in respiratory distress due to fluid overload. Dr. Harrington Challenger and Dr. Chana Bode at bedside to see patient. Patient placed on bipap and is being transferred to step down unit 2c. Report called to [place], Therapist, sports. Wife at bedside. Patient transferred to [facility] with rapid response nurse and RT.

## 2017-02-17 NOTE — Progress Notes (Signed)
Progress Note  Patient Name: Dillon Sweeney Date of Encounter: 02/17/2017  Primary Cardiologist: Dorris Carnes, MD -New. Wants to follow up in [place].  Subjective   More SOB today, wheezing less. No CP or palps  Inpatient Medications    Scheduled Meds: . amoxicillin-clavulanate  1 tablet Oral Q12H  . furosemide  80 mg Intravenous BID  . guaiFENesin  1,200 mg Oral BID  . insulin aspart  0-9 Units Subcutaneous TID WC  . levalbuterol  0.63 mg Nebulization TID  . levothyroxine  137 mcg Oral QAC breakfast  . methylPREDNISolone (SOLU-MEDROL) injection  60 mg Intravenous Daily  . metoprolol succinate  100 mg Oral Daily  . mometasone-formoterol  2 puff Inhalation BID  . pantoprazole  40 mg Oral Daily  . potassium chloride  40 mEq Oral BID WC  . rosuvastatin  40 mg Oral q1800   Continuous Infusions: . sodium chloride     PRN Meds: acetaminophen, benzonatate, haloperidol lactate, levalbuterol, ondansetron **OR** ondansetron (ZOFRAN) IV   Vital Signs    Vitals:   02/16/17 2005 02/17/17 0434 02/17/17 0929 02/17/17 0932  BP:  (!) 125/48    Pulse:  74    Resp:  20    Temp: 98.1 F (36.7 C) 98 F (36.7 C)    TempSrc: Oral Oral    SpO2:  98% 91% 91%  Weight:  167 lb 8.8 oz (76 kg)    Height:        Intake/Output Summary (Last 24 hours) at 02/17/2017 1020 Last data filed at 02/17/2017 0439 Gross per 24 hour  Intake -  Output 1850 ml  Net -1850 ml   Filed Weights   02/15/17 0431 02/16/17 0501 02/17/17 0434  Weight: 185 lb 10 oz (84.2 kg) 170 lb 1.6 oz (77.2 kg) 167 lb 8.8 oz (76 kg)    Telemetry    SR, PVCs and pairs - Personally Reviewed  ECG    Ordered - Personally Reviewed  Physical Exam   GEN: +Respiratory distress.   Neck: JVD 10 cm  Cardiac: RRR, 2/6 murmurs, no rubs, or gallops.  Respiratory: coarse dense rales bilateral bases, slight wheeze GI:  + BS, soft, NT MS: no edema, pulses intact Neuro:  nonfocal Psych: anxious and SOB  Labs     Chemistry Recent Labs  Lab 02/12/17 1158  02/15/17 0711 02/16/17 0608 02/17/17 0606  NA  --    < > 137 137 139  K  --    < > 4.1 3.5 3.3*  CL  --    < > 101 99* 94*  CO2  --    < > 23 26 32  GLUCOSE  --    < > 141* 184* 131*  BUN  --    < > 26* 27* 23*  CREATININE  --    < > 1.20 1.09 1.21  CALCIUM  --    < > 8.7* 9.2 8.9  PROT 6.0*  --   --  6.7 6.3*  ALBUMIN 2.7*  --   --  2.8* 2.6*  AST 34  --   --  28 25  ALT 15*  --   --  22 20  ALKPHOS 53  --   --  74 76  BILITOT 1.4*  --   --  1.0 1.1  GFRNONAA  --    < > 54* >60 54*  GFRAA  --    < > >60 >60 >60  ANIONGAP  --    < >  13 12 13    < > = values in this interval not displayed.     Hematology Recent Labs  Lab 02/15/17 0711 02/16/17 0608 02/17/17 0606  WBC 12.4* 11.0* 11.4*  RBC 2.65* 2.71* 2.75*  HGB 8.2* 8.4* 8.7*  HCT 25.2* 26.0* 26.4*  MCV 95.1 95.9 96.0  MCH 30.9 31.0 31.6  MCHC 32.5 32.3 33.0  RDW 22.5* 22.6* 22.3*  PLT 65* 62* 62*    Cardiac Enzymes Recent Labs  Lab 02/10/17 1829 02/10/17 2320 02/11/17 0649  TROPONINI 1.31* 1.11* 0.48*    Recent Labs  Lab 02/10/17 1245 02/10/17 1538  TROPIPOC 0.70* 0.95*     DDimer  Recent Labs  Lab 02/12/17 1158  DDIMER 3.22*     Radiology    Dg Chest Port 1 View  Result Date: 02/16/2017 CLINICAL DATA:  Shortness of Breath EXAM: PORTABLE CHEST 1 VIEW COMPARISON:  February 13, 2017 FINDINGS: Small pleural effusions with bibasilar atelectasis remain. There is no consolidation. Heart is enlarged with pulmonary vascularity within normal limits. No adenopathy. There is aortic atherosclerosis. Patient is status post coronary artery bypass grafting. No bone lesions evident. IMPRESSION: Small pleural effusions and bibasilar atelectasis, stable. Stable cardiomegaly. No new opacity. No change in cardiac silhouette. There is aortic atherosclerosis. Aortic Atherosclerosis (ICD10-I70.0). Electronically Signed   By: Lowella Grip III M.D.   On: 02/16/2017 09:26     Cardiac Studies   Echocardiogram 02/13/2017 Study Conclusions - Left ventricle: The cavity size was normal. Wall thickness was   normal. Systolic function was moderately reduced. The estimated   ejection fraction was in the range of 35% to 40%. Diffuse   hypokinesis. - Mitral valve: There was mild to moderate regurgitation directed   centrally. - Left atrium: The atrium was mildly dilated. - Right ventricle: The cavity size was mildly dilated. Systolic   function was moderately reduced. - Right atrium: The atrium was mildly dilated. - Tricuspid valve: There was moderate regurgitation. - Pulmonary arteries: Systolic pressure was mildly increased. PA   peak pressure: 40 mm Hg (S).  Patient Profile     84 y.o. male with history of CAD (s/p CABG in 30), AAA (s/p repair), DM, HTN Presented to [facility] with CP and SOB Trop elevated 1.3 Hgb 8.4 decreased to 7.  Assessment & Plan    1. Acute on chronic systolic heart failure -  Wt down to 167 lbs but resp effort is increased and dense rales on exam.  - recheck CXR - RR called, pt tx to stepdown - breathing better on BiPAP - EF 35-40% w/ diffuse HK - no output charted for 01/16, net neg 2L 01/17 - supp K+ - now on Lasix 80 mg IV BID, may need more or metolazone - on BB, no spiro, ACE/ARB for now, may be Entresto candidate  2. CAD/NSTEMI -CAD s/p CABG (1986):  - felt demand ischemia - no sx despite worsening resp status - Peak trop 1.38 - EF 35-40%, no WMA - ASA only 2nd anemia -no additional wkup planned - continue BB/statin  3. Atrial fibrillation   EKG on 1/14 read out as atrial fibrillation  On closer review it may be SR with PACs  (baseline P wave morphology difficult  Small)   Other EKGs look like sinus  Rhythm    4. Anemia  Hgb 8.7    -  5. HTN:   BP is OK - no med changes  6. DM: - per IM  7. Renal:   -  peak Cr 1.46 - continue to follow  8. Likely underlying COPD:  - per IM - on nebs, ABX, taper  steroids   For questions or updates, please contact [place] Please consult [email or web address] for contact info under Cardiology/STEMI.      Signed, Rosaria Ferries, PA-C  02/17/2017, 10:20 AM    Pt seen and examined   I agree with findings as noted by R Barrett above   Pt was doing better 2 days ago  Yesterday increased rhonchi, wheezes  Did not get 80 lasix until this AM Now SOB  RRT called  On bipap now   On exam; Pt appears tired   More comfortable on biPAP  Sats now 99%  Lungs Diffuse rhonchi and wheezes  Cardiac RRR  S1   No S3  Ext with triv edema    I would continue on IV lasix  80 mg   TID for now Back down on solumedrol to qd and then taper   Dorris Carnes

## 2017-02-17 NOTE — Progress Notes (Signed)
PROGRESS NOTE    Dillon Sweeney  FHL:[id number] DOB: 14-Oct-1932 DOA: 02/10/2017 PCP: Eustaquio Maize, MD   Brief Narrative:  Dillon Sweeney is a 84 y.o. man admitted on 1/11 after visiting his PCP for shortness of breath and being referred to the hospital. 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 Also has a history of AAA, diabetes, hypertension, hypothyroidism. In the ED he also complained of chest pain that he described as pressure-like radiating to both arms. In the ED he was found to meet SIRS criteria and was given broad-spectrum antibiotics. Because of elevated troponins with a high of 1.31, Cardiology was consulted overnight who recommended transfer to [facility] for further evaluation. However due to lack of bed availability patient remains in the emergencydepartment at [facility]. Admitted for Acute Respiratory Failure with Hypoxia, Acute Systolic CHF Exacerbation, and Dyspnea. Currently being diuresed and treated for Aspiration PNA and decompensated today as he had increased work of breathing. A rapid response was called and I went to the bedside to evaluate and Cardiology was already there. I spoke with Dr. Harrington Challenger who felt he decompensated because of his Heart Failure and she accepted the patient to her Service. He was placed on BiPAP and transferred to SDU.   Assessment & Plan:   Principal Problem:   Chest pain Active Problems:   Abdominal aortic aneurysm (HCC)   Diabetes (HCC)   HTN (hypertension)   Tobacco use disorder   Myocardial infarction   Lung nodule   Shortness of breath   Troponin level elevated   Thrombocytopenia (HCC)   Normocytic anemia   Acute on chronic systolic heart failure (HCC)   Protein-calorie malnutrition, severe  Acute Respiratory Failure with Hypoxia now requiring NIPPV with BiPAP  -Multifactorial -Patient qualifies for 3 Liters of O2 given Walk screen for Home O2 -C/w Diuresis per Cards; po  Lasix changed to IV Lasix 40 mg BID and then increased to 80 mg BID -Started IV Solumedrol 60 mg q12h and will wean to 60 mg Daily and down to 40 mg IV Daily and then to po  -Patient decompensated and will be transferred to SDU on BiPAP   Chest Pain -Resolved.  -Concern for NSTEMI however troponin trended down. -Cardiology consulted and assessment is this is likely related to anemia and Type 2 Nstemi.  -Recommending stress test as an outpatient.  Anemia andThrombocytopenia -Unknown etiology.  -Patient without history of GI bleeding.  -Per chart review, patient has had chronic anemia up until 2012.  -Last hemoglobin prior to admission was about one year prior. -History of colonoscopy within a few years with unknown results per patient. S/p 1 unit of PRBC since admission. FOBT negative. - DIC panel abnormal with elevated PT/PTT, fibrinogen and d-dimer. LDH elevated with slightly elevated total/indirect bilirubin. Unknown if this is chronic. Haptoglobin elevated. -Direct coombs negative. Hematology recommending supportive care with Transfusions  -Hb stable at 9.0 and Platelet Count was 73 -Hematology Recc's  appreciated and felt as if continuing supportive Care -Repeat CBC in AM   Wheezing in the setting of Undiagnosed COPD, improved -Likely COPD, but patient does not have official diagnosis. Quit smoking three months ago. -Xopenx q 3 times daily -Continue Dulera -Changed prednisone to IV Solumedrol and now will Decrease Solumedrol Dose to 40 mg IV Daily and eventually back to po Prednisone  -Mucinex   Dyspnea with increased Work of Breathing  -Combination of bronchitis/COPD  and pulmonary edema and Aspiration PNA. Resolved. -Cardiology starting scheduled diuresis and it was held because of AKI; Now resumed IV 80 mg po Daily  -Repeat STAT CXR showed Persistent interstitial edema in the lower lungs with small effusions and basilar volume loss. Upper lungs largely clear. Similar  appearance to the previous exam. This may be slightly worsened radiographically. -May consider CT Scan of Chest if continues to have Dyspnea even after adequate Diuresis.   Acute Systolic Heart Failure -Unsure if this is chronic. EF of 35-40% with diffuse hypokinesis on echocardiogram from 1/14.  -Patient presented with concern for Nstemi. -Cardiology recommendations appreciated -Cardiology changed patient to Metoprolol Succinate 100 mg po Daily  -Restarted po Lasix 40 mg po BID and Cardiology changed to IV Lasix today  -Strict I's/O's, Daily Weights, and SLIV -Patient is -2.927 mL and Weight is down -13 Lbs but ? Accuracy -Per Cardiology may be a candidate for Entresto eventually and may add Metolazone   Acute Kidney Injury, improved  -Likely secondary to diuresis. -Recommendeed to hold lasix but will now resume at 40 mg po BID now that Cr is improved; Cardiology changed to IV 40 mg BID to IV 80 mg today  -Repeat CMP in AM   Paroxysmal Atrial Fibrillation  -Likely rebound from decrease in metoprolol. EKG suggested Atrial fibrillation but Cardiology feels it was Sinus Rhtyhm with PAC's. Rate better controlled and was in Sinus toady -Continue Metoprolol but changed to Metoprolol Succinate by Cardiology  -Cardiology recommendations appreciated. Per Cards patient is not a candidate for Anticoagulation given recent Problems with GIB   SIRS from PNA  -Criteria met on admission. No source initially but now with evidence of aspiration pneumonia.  -Given empiric antibiotics. Blood culture with no growth x3 days.  -Urine culture without growth. Chest x-ray suggesting pneumonia. -C/w Augmentin for today and will D/C in AM as patient will complete 7 days of Abx. -SLP Recommending Regular Diet with Thin Liquids   Aspiration Pneumonia -Suggested on Chest x-ray earlier on Admission. -C/w Augmentin -C/w Mucinex and Benzonatate -C/w Xopenex 0.63 mg Neb TID and Dulera -Started IV Solumedrol  60 mg q12h and changed to IV Solumedrol 60 mg Daily and now decreased to 40 mg IV Daily  -Repeat CXR as above   Lung Nodule -Incidental. Outpatient follow-up with repeat chest CT in 3 months  Diabetes Mellitus -C/w Sensitive Novolog SSI AC -CBG's ranging from 123-210  Delirium, improved  -Appears to have resolved. Unknown etiology. Patient has underlying cognitive impairment per colleague discussion with wife.  -CT head negative for hematoma.  -Delirium Precautions  -Appears to have resolved.  Hypothyroidism -Low TSH. -Decreased Synthroid dose to 137 mcg daily  Severe Malnutrition in the Context of Chronic illness -Nutritionist consulted and appreciate Recc's -C/w Ensure Enlive po daily and 30 ml Prostat BID -C/w MVI daily   Leukocytosis -Likely from IV Steroid Demargination -Continue to Monitor for S/Sx of Infection.  -Patient on Augmentin for Aspiration  -Repeat CBC in AM  DVT prophylaxis: SCDs given recent Anemia and Hx of GIB Code Status: FULL CODE Family Communication: Discussed with wife at bedside Disposition Plan: Transferred to SDU to Cardiology [facility] when medically stable at D/C  Consultants:   Cardiology now Primary   [facility]  Hematology/Oncology  Procedures:  ECHOCARDIOGRAM ------------------------------------------------------------------- Study Conclusions  - Left ventricle: The cavity size was normal. Wall thickness was   normal. Systolic function was moderately reduced. The estimated   ejection fraction was in the range of  35% to 40%. Diffuse   hypokinesis. - Mitral valve: There was mild to moderate regurgitation directed   centrally. - Left atrium: The atrium was mildly dilated. - Right ventricle: The cavity size was mildly dilated. Systolic   function was moderately reduced. - Right atrium: The atrium was mildly dilated. - Tricuspid valve: There was moderate regurgitation. - Pulmonary arteries: Systolic pressure was  mildly increased. PA   peak pressure: 40 mm Hg (S).  Antimicrobials:  Anti-infectives (From admission, onward)   Start     Dose/Rate Route Frequency Ordered Stop   02/14/17 1000  amoxicillin-clavulanate (AUGMENTIN) 875-125 MG per tablet 1 tablet     1 tablet Oral Every 12 hours 02/14/17 0716     02/11/17 2200  piperacillin-tazobactam (ZOSYN) IVPB 3.375 g  Status:  Discontinued     3.375 g 12.5 mL/hr over 240 Minutes Intravenous Every 8 hours 02/11/17 1633 02/12/17 1022   02/11/17 0800  vancomycin (VANCOCIN) IVPB 750 mg/150 ml premix  Status:  Discontinued     750 mg 150 mL/hr over 60 Minutes Intravenous Every 12 hours 02/10/17 2035 02/11/17 1436   02/10/17 2200  piperacillin-tazobactam (ZOSYN) IVPB 3.375 g  Status:  Discontinued     3.375 g 12.5 mL/hr over 240 Minutes Intravenous Every 8 hours 02/10/17 2031 02/11/17 1436   02/10/17 1900  vancomycin (VANCOCIN) 1,500 mg in sodium chloride 0.9 % 500 mL IVPB     1,500 mg 250 mL/hr over 120 Minutes Intravenous  Once 02/10/17 1834 02/10/17 2120   02/10/17 1445  cefTRIAXone (ROCEPHIN) 1 g in dextrose 5 % 50 mL IVPB  Status:  Discontinued     1 g 100 mL/hr over 30 Minutes Intravenous  Once 02/10/17 1441 02/10/17 1444   02/10/17 1445  piperacillin-tazobactam (ZOSYN) IVPB 3.375 g     3.375 g 100 mL/hr over 30 Minutes Intravenous  Once 02/10/17 1444 02/10/17 1553     Subjective: Seen and examined at bedside this AM when Rapid was called and patient had increased work of breathing. He stated "it all went to hell in a handbasket." Felt more dyspneic and had slightly labored breathing. No CP.   Objective: Vitals:   02/16/17 1416 02/16/17 1941 02/16/17 2005 02/17/17 0434  BP:  137/88  (!) 125/48  Pulse:  94  74  Resp:    20  Temp:   98.1 F (36.7 C) 98 F (36.7 C)  TempSrc:   Oral Oral  SpO2: 94% 95%  98%  Weight:    76 kg (167 lb 8.8 oz)  Height:        Intake/Output Summary (Last 24 hours) at 02/17/2017 0821 Last data filed at  02/17/2017 0439 Gross per 24 hour  Intake 240 ml  Output 2250 ml  Net -2010 ml   Filed Weights   02/15/17 0431 02/16/17 0501 02/17/17 0434  Weight: 84.2 kg (185 lb 10 oz) 77.2 kg (170 lb 1.6 oz) 76 kg (167 lb 8.8 oz)   Examination: Physical Exam:  Constitutional: WN/WD Caucasian male in NAD; Appears calm. Eyes: Sclerae anicteric; Lids normal ENMT: External Ears and nose appear normal. MMM Neck: Supple with no JVD Respiratory: Diminished with labored breathing and has crackles but no wheezing or rhonchi. Patient is tachypenic  Cardiovascular: RRR; Has 1+ LE edema Abdomen: Soft, NT, ND. Bowel Sounds present GU: Deferred Musculoskeletal: No contractures; No cyanosis Skin: Warm and dry; No appreciable rashes or lesions Neurologic: CN 2-12 grossly intact. No appreciable focal deficits  Psychiatric: Anxious appearing mood.  Intact judgement and insight  Data Reviewed: I have personally reviewed following labs and imaging studies  CBC: Recent Labs  Lab 02/10/17 1229  02/13/17 0941 02/14/17 0607 02/15/17 0711 02/16/17 0608 02/17/17 0606  WBC 14.5*   < > 10.9* 8.8 12.4* 11.0* 11.4*  NEUTROABS 12.9*  --   --   --   --  9.4* PENDING  HGB 8.4*   < > 8.2* 8.2* 8.2* 8.4* 8.7*  HCT 25.7*   < > 25.0* 24.9* 25.2* 26.0* 26.4*  MCV 97.3   < > 92.9 94.3 95.1 95.9 96.0  PLT 72*   < > 73* 68* 65* 62* PENDING   < > = values in this interval not displayed.   Basic Metabolic Panel: Recent Labs  Lab 02/13/17 0941 02/14/17 0607 02/15/17 0711 02/16/17 0608 02/17/17 0606  NA 134* 136 137 137 139  K 3.6 2.9* 4.1 3.5 3.3*  CL 103 99* 101 99* 94*  CO2 19* _0 32  GLUCOSE 153* 106* 141* 184* 131*  BUN 18 25* 26* 27* 23*  CREATININE 1.34* 1.46* 1.20 1.09 1.21  CALCIUM 8.5* 8.7* 8.7* 9.2 8.9  MG  --  2.1  --  2.2 2.1  PHOS  --   --   --  3.2 3.1   GFR: Estimated Creatinine Clearance: 47.1 mL/min (by C-G formula based on SCr of 1.21 mg/dL). Liver Function Tests: Recent Labs  Lab  02/12/17 1158 02/16/17 0608 02/17/17 0606  AST 34 28 25  ALT 15* 22 20  ALKPHOS 53 74 76  BILITOT 1.4* 1.0 1.1  PROT 6.0* 6.7 6.3*  ALBUMIN 2.7* 2.8* 2.6*   No results for input(s): LIPASE, AMYLASE in the last 168 hours. No results for input(s): AMMONIA in the last 168 hours. Coagulation Profile: Recent Labs  Lab 02/12/17 1158  INR 1.31   Cardiac Enzymes: Recent Labs  Lab 02/10/17 1829 02/10/17 2320 02/11/17 0649  TROPONINI 1.31* 1.11* 0.48*   BNP (last 3 results) No results for input(s): PROBNP in the last 8760 hours. HbA1C: No results for input(s): HGBA1C in the last 72 hours. CBG: Recent Labs  Lab 02/16/17 0755 02/16/17 1154 02/16/17 1632 02/16/17 2130 02/17/17 0747  GLUCAP 165* 176* 203* 216* 123*   Lipid Profile: No results for input(s): CHOL, HDL, LDLCALC, TRIG, CHOLHDL, LDLDIRECT in the last 72 hours. Thyroid Function Tests: No results for input(s): TSH, T4TOTAL, FREET4, T3FREE, THYROIDAB in the last 72 hours. Anemia Panel: No results for input(s): VITAMINB12, FOLATE, FERRITIN, TIBC, IRON, RETICCTPCT in the last 72 hours. Sepsis Labs: Recent Labs  Lab 02/10/17 1300 02/10/17 1517 02/10/17 1829  LATICACIDVEN 2.74* 2.3* 1.9    Recent Results (from the past 240 hour(s))  Urine culture     Status: None   Collection Time: 02/10/17 12:46 PM  Result Value Ref Range Status   Specimen Description URINE, CATHETERIZED  Final   Special Requests NONE  Final   Culture   Final    NO GROWTH Performed at [facility], [place] [address] [place], [place] [address]    Report Status 02/12/2017 FINAL  Final  Blood culture (routine x 2)     Status: None   Collection Time: 02/10/17  3:25 PM  Result Value Ref Range Status   Specimen Description BLOOD RIGHT FOREARM  Final   Special Requests   Final    BOTTLES DRAWN AEROBIC AND ANAEROBIC Blood Culture adequate volume   Culture NO GROWTH 5 DAYS  Final  Report Status 02/15/2017 FINAL  Final  Blood culture  (routine x 2)     Status: None   Collection Time: 02/10/17  3:37 PM  Result Value Ref Range Status   Specimen Description LEFT ANTECUBITAL  Final   Special Requests   Final    BOTTLES DRAWN AEROBIC AND ANAEROBIC Blood Culture adequate volume   Culture NO GROWTH 5 DAYS  Final   Report Status 02/15/2017 FINAL  Final  MRSA PCR Screening     Status: None   Collection Time: 02/14/17  7:49 AM  Result Value Ref Range Status   MRSA by PCR NEGATIVE NEGATIVE Final    Comment:        The GeneXpert MRSA Assay (FDA approved for NASAL specimens only), is one component of a comprehensive MRSA colonization surveillance program. It is not intended to diagnose MRSA infection nor to guide or monitor treatment for MRSA infections.     Radiology Studies: Dg Chest Port 1 View  Result Date: 02/16/2017 CLINICAL DATA:  Shortness of Breath EXAM: PORTABLE CHEST 1 VIEW COMPARISON:  February 13, 2017 FINDINGS: Small pleural effusions with bibasilar atelectasis remain. There is no consolidation. Heart is enlarged with pulmonary vascularity within normal limits. No adenopathy. There is aortic atherosclerosis. Patient is status post coronary artery bypass grafting. No bone lesions evident. IMPRESSION: Small pleural effusions and bibasilar atelectasis, stable. Stable cardiomegaly. No new opacity. No change in cardiac silhouette. There is aortic atherosclerosis. Aortic Atherosclerosis (ICD10-I70.0). Electronically Signed   By: Lowella Grip III M.D.   On: 02/16/2017 09:26    Scheduled Meds: . amoxicillin-clavulanate  1 tablet Oral Q12H  . furosemide  80 mg Intravenous BID  . guaiFENesin  1,200 mg Oral BID  . insulin aspart  0-9 Units Subcutaneous TID WC  . levalbuterol  0.63 mg Nebulization TID  . levothyroxine  137 mcg Oral QAC breakfast  . methylPREDNISolone (SOLU-MEDROL) injection  60 mg Intravenous Daily  . metoprolol succinate  100 mg Oral Daily  . mometasone-formoterol  2 puff Inhalation BID  .  pantoprazole  40 mg Oral Daily  . potassium chloride  40 mEq Oral BID  . rosuvastatin  40 mg Oral q1800   Continuous Infusions: . sodium chloride      LOS: 7 days   Kerney Elbe, DO [facility] Pager [phone]  If 7PM-7AM, please contact night-coverage [email or web address] Password [email or web address] 02/17/2017, 8:21 AM

## 2017-02-17 NOTE — Plan of Care (Signed)
Patient was given an one time dose of IV lasix resulting in great output. A condom cath was placed on the patient to properly record output.

## 2017-02-18 ENCOUNTER — Inpatient Hospital Stay (HOSPITAL_COMMUNITY): Payer: Medicare Other

## 2017-02-18 LAB — CBC WITH DIFFERENTIAL/PLATELET
BASOS ABS: 0 10*3/uL (ref 0.0–0.1)
BASOS PCT: 0 %
EOS ABS: 0 10*3/uL (ref 0.0–0.7)
Eosinophils Relative: 0 %
HCT: 27.7 % — ABNORMAL LOW (ref 39.0–52.0)
HEMOGLOBIN: 9 g/dL — AB (ref 13.0–17.0)
LYMPHS ABS: 0.5 10*3/uL — AB (ref 0.7–4.0)
LYMPHS PCT: 5 %
MCH: 31.4 pg (ref 26.0–34.0)
MCHC: 32.5 g/dL (ref 30.0–36.0)
MCV: 96.5 fL (ref 78.0–100.0)
Monocytes Absolute: 0.8 10*3/uL (ref 0.1–1.0)
Monocytes Relative: 8 %
NEUTROS ABS: 8.8 10*3/uL — AB (ref 1.7–7.7)
Neutrophils Relative %: 87 %
PLATELETS: 57 10*3/uL — AB (ref 150–400)
RBC: 2.87 MIL/uL — ABNORMAL LOW (ref 4.22–5.81)
RDW: 22.1 % — ABNORMAL HIGH (ref 11.5–15.5)
WBC: 10.1 10*3/uL (ref 4.0–10.5)

## 2017-02-18 LAB — COMPREHENSIVE METABOLIC PANEL
ALK PHOS: 80 U/L (ref 38–126)
ALT: 20 U/L (ref 17–63)
ANION GAP: 14 (ref 5–15)
AST: 27 U/L (ref 15–41)
Albumin: 2.7 g/dL — ABNORMAL LOW (ref 3.5–5.0)
BUN: 28 mg/dL — ABNORMAL HIGH (ref 6–20)
CALCIUM: 8.9 mg/dL (ref 8.9–10.3)
CO2: 33 mmol/L — ABNORMAL HIGH (ref 22–32)
CREATININE: 1.27 mg/dL — AB (ref 0.61–1.24)
Chloride: 90 mmol/L — ABNORMAL LOW (ref 101–111)
GFR, EST AFRICAN AMERICAN: 59 mL/min — AB (ref >60)
GFR, EST NON AFRICAN AMERICAN: 51 mL/min — AB (ref >60)
Glucose, Bld: 156 mg/dL — ABNORMAL HIGH (ref 65–99)
Potassium: 3.2 mmol/L — ABNORMAL LOW (ref 3.5–5.1)
Sodium: 137 mmol/L (ref 135–145)
Total Bilirubin: 1.3 mg/dL — ABNORMAL HIGH (ref 0.3–1.2)
Total Protein: 6.9 g/dL (ref 6.5–8.1)

## 2017-02-18 LAB — GLUCOSE, CAPILLARY
GLUCOSE-CAPILLARY: 124 mg/dL — AB (ref 65–99)
GLUCOSE-CAPILLARY: 265 mg/dL — AB (ref 65–99)
Glucose-Capillary: 161 mg/dL — ABNORMAL HIGH (ref 65–99)
Glucose-Capillary: 216 mg/dL — ABNORMAL HIGH (ref 65–99)

## 2017-02-18 LAB — MAGNESIUM: MAGNESIUM: 2 mg/dL (ref 1.7–2.4)

## 2017-02-18 LAB — PHOSPHORUS: PHOSPHORUS: 3.1 mg/dL (ref 2.5–4.6)

## 2017-02-18 MED ORDER — POTASSIUM CHLORIDE CRYS ER 20 MEQ PO TBCR
40.0000 meq | EXTENDED_RELEASE_TABLET | Freq: Two times a day (BID) | ORAL | Status: DC
  Administered 2017-02-18: 40 meq via ORAL
  Filled 2017-02-18: qty 2

## 2017-02-18 MED ORDER — CARVEDILOL 6.25 MG PO TABS
6.2500 mg | ORAL_TABLET | Freq: Two times a day (BID) | ORAL | Status: DC
  Administered 2017-02-18 – 2017-02-21 (×4): 6.25 mg via ORAL
  Filled 2017-02-18 (×4): qty 1

## 2017-02-18 NOTE — Plan of Care (Signed)
Pt is calm and relaxed. Verbally expressed anxiety has decreased. Will continue to monitor.

## 2017-02-18 NOTE — Progress Notes (Signed)
PROGRESS NOTE    Dillon Sweeney  OMV:[id number] DOB: 1932-05-23 DOA: 02/10/2017 PCP: Eustaquio Maize, MD   Brief Narrative:  Dillon Sweeney is a 84 y.o. man admitted on 1/11 after visiting his PCP for shortness of breath and being referred to the hospital. 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 Also has a history of AAA, diabetes, hypertension, hypothyroidism. In the ED he also complained of chest pain that he described as pressure-like radiating to both arms. In the ED he was found to meet SIRS criteria and was given broad-spectrum antibiotics. Because of elevated troponins with a high of 1.31, Cardiology was consulted overnight who recommended transfer to [facility] for further evaluation. However due to lack of bed availability patient remains in the emergencydepartment at [facility]. Admitted for Acute Respiratory Failure with Hypoxia, Acute Systolic CHF Exacerbation, and Dyspnea. Currently being diuresed and treated for Aspiration PNA and decompensated today as he had increased work of breathing. A rapid response was called and I went to the bedside to evaluate and Cardiology was already there. I spoke with Dr. Harrington Challenger who felt he decompensated because of his Heart Failure and she accepted the patient to her Service. He was placed on BiPAP and transferred to SDU. Patient was breathing a little harder today so was placed back on BiPAP. Cardiology consulting PCCM for further evaluation of Respiratory status.   Assessment & Plan:   Principal Problem:   Chest pain Active Problems:   Abdominal aortic aneurysm (HCC)   Diabetes (HCC)   HTN (hypertension)   Tobacco use disorder   Myocardial infarction   Lung nodule   Shortness of breath   Troponin level elevated   Thrombocytopenia (HCC)   Normocytic anemia   Acute on chronic systolic heart failure (HCC)   Protein-calorie malnutrition, severe  Acute Respiratory Failure with Hypoxia  now requiring NIPPV with BiPAP  -Multifactorial -Patient qualifies for 3 Liters of O2 given Walk screen for Home O2 -C/w Diuresis per Cards; po Lasix changed to IV Lasix 40 mg BID and then increased to 80 mg BID -Started IV Solumedrol 60 mg q12h and will wean to 60 mg Daily and down to 40 mg IV Daily and then to po  -Patient decompensated and will be transferred to SDU on BiPAP; Was off of BiPAP this AM but had to be put back on   Chest Pain -Resolved.  -Concern for NSTEMI however troponin trended down. -Cardiology consulted and assessment is this is likely related to anemia and Type 2 NSTEMI.  -Recommending stress test as an outpatient.  Anemia andThrombocytopenia -Unknown etiology.  -Patient without history of GI bleeding.  -Per chart review, patient has had chronic anemia up until 2012.  -Last hemoglobin prior to admission was about one year prior. -History of colonoscopy within a few years with unknown results per patient. S/p 1 unit of PRBC since admission. FOBT negative. - DIC panel abnormal with elevated PT/PTT, fibrinogen and d-dimer. LDH elevated with slightly elevated total/indirect bilirubin. Unknown if this is chronic. Haptoglobin elevated. -Direct coombs negative. Hematology recommending supportive care with Transfusions  -Hb stable at 9.0 and Platelet Count was 39 -Hematology Recc's appreciated and felt as if continuing supportive Care -Repeat CBC in AM   Wheezing in the setting of Undiagnosed COPD, improved -Likely COPD, but patient does not have official diagnosis. Quit smoking three months ago. -Xopenx q 3 times daily -Continue Dulera -Changed prednisone  to IV Solumedrol and now will Decrease Solumedrol Dose to 40 mg IV Daily and eventually back to po Prednisone  -Mucinex  -Cardiology consulting PCCM for evaluation   Dyspnea with increased Work of Breathing  -Combination of bronchitis/COPD and pulmonary edema and Aspiration PNA. Resolved. -Cardiology starting  scheduled diuresis and it was held because of AKI; Now resumed IV 80 mg po Daily  -Repeat CXR this AM showed Improved ventilation with regression of bilateral pulmonary edema and pleural effusions since 02/17/2017. Underlying emphysema. -May consider CT Scan of Chest if continues to have Dyspnea even after adequate Diuresis.  -Cardiology consulting PCCM for evaluation   Acute Systolic Heart Failure -Unsure if this is chronic. EF of 35-40% with diffuse hypokinesis on echocardiogram from 1/14.  -Patient presented with concern for Nstemi. -Cardiology recommendations appreciated -Cardiology changed patient to Metoprolol Succinate 100 mg po Daily  -Cardiology continuing to diurese with IV 80 mg BID  -Strict I's/O's, Daily Weights, and SLIV -Patient is -4.887 mL and Weight is down -13 Lbs but ? Accuracy -Per Cardiology may be a candidate for Entresto eventually and may add Metolazone   Acute Kidney Injury, improved  -Likely secondary to diuresis. -Recommendeed to hold lasix initially  but will now resume at 40 mg po BID now that Cr is improved; Cardiology changed to IV 40 mg BID to IV 80 mg today  -Cr slowly going up now  -Repeat CMP in AM   Paroxysmal Atrial Fibrillation but likely was Sinus Rhythm -Likely rebound from decrease in metoprolol. EKG suggested Atrial fibrillation but Cardiology feels it was Sinus Rhtyhm with PAC's. Rate better controlled and was in Sinus toady -Continue Metoprolol but changed to Metoprolol Succinate by Cardiology  -Cardiology recommendations appreciated. Per Cards patient is not a candidate for Anticoagulation given recent Problems with GIB   SIRS from PNA  -Criteria met on admission. No source initially but now with evidence of aspiration pneumonia.  -Given empiric antibiotics. Blood culture with no growth x3 days.  -Urine culture without growth. Chest x-ray suggesting pneumonia. -D/C'd Augmentin as completed 7 days of Abx. -SLP Recommending Regular Diet  with Thin Liquids   Aspiration Pneumonia -Suggested on Chest x-ray earlier on Admission. -D/C'd Augmentin -C/w Mucinex and Benzonatate -C/w Xopenex 0.63 mg Neb TID and Dulera -Started IV Solumedrol 60 mg q12h and changed to IV Solumedrol 60 mg Daily and now decreased to 40 mg IV Daily  -Repeat CXR this AM showed Improved ventilation with regression of bilateral pulmonary edema and pleural effusions since 02/17/2017.  Underlying emphysema  Lung Nodule -Incidental. Outpatient follow-up with repeat chest CT in 3 months  Diabetes Mellitus -C/w Sensitive Novolog SSI AC -CBG's ranging from 121-265  Delirium, improved  -Appears to have resolved. Unknown etiology. Patient has underlying cognitive impairment per colleague discussion with wife.  -CT head negative for hematoma.  -Delirium Precautions  -Appears to have resolved.  Hypothyroidism -Low TSH. -Decreased Synthroid dose to 137 mcg daily  Severe Malnutrition in the Context of Chronic illness -Nutritionist consulted and appreciate Recc's -C/w Ensure Enlive po daily and 30 ml Prostat BID -C/w MVI daily   Leukocytosis, improved  -Likely from IV Steroid Demargination -Continue to Monitor for S/Sx of Infection.  -Patient on Augmentin for Aspiration and will D/C Abx today  -Repeat CBC in AM  Hypokalemia -Replete -Repeat CMP in AM  DVT prophylaxis: SCDs given recent Anemia and Hx of GIB Code Status: FULL CODE Family Communication: Discussed with wife at bedside Disposition Plan: Transferred to SDU to  Cardiology Service Home Health PT when medically stable at D/C  Consultants:   Cardiology now Primary   [facility]  Hematology/Oncology  Procedures:  ECHOCARDIOGRAM ------------------------------------------------------------------- Study Conclusions  - Left ventricle: The cavity size was normal. Wall thickness was   normal. Systolic function was moderately reduced. The estimated   ejection fraction was in the range of  35% to 40%. Diffuse   hypokinesis. - Mitral valve: There was mild to moderate regurgitation directed   centrally. - Left atrium: The atrium was mildly dilated. - Right ventricle: The cavity size was mildly dilated. Systolic   function was moderately reduced. - Right atrium: The atrium was mildly dilated. - Tricuspid valve: There was moderate regurgitation. - Pulmonary arteries: Systolic pressure was mildly increased. PA   peak pressure: 40 mm Hg (S).  Antimicrobials:  Anti-infectives (From admission, onward)   Start     Dose/Rate Route Frequency Ordered Stop   02/14/17 1000  amoxicillin-clavulanate (AUGMENTIN) 875-125 MG per tablet 1 tablet     1 tablet Oral Every 12 hours 02/14/17 0716     02/11/17 2200  piperacillin-tazobactam (ZOSYN) IVPB 3.375 g  Status:  Discontinued     3.375 g 12.5 mL/hr over 240 Minutes Intravenous Every 8 hours 02/11/17 1633 02/12/17 1022   02/11/17 0800  vancomycin (VANCOCIN) IVPB 750 mg/150 ml premix  Status:  Discontinued     750 mg 150 mL/hr over 60 Minutes Intravenous Every 12 hours 02/10/17 2035 02/11/17 1436   02/10/17 2200  piperacillin-tazobactam (ZOSYN) IVPB 3.375 g  Status:  Discontinued     3.375 g 12.5 mL/hr over 240 Minutes Intravenous Every 8 hours 02/10/17 2031 02/11/17 1436   02/10/17 1900  vancomycin (VANCOCIN) 1,500 mg in sodium chloride 0.9 % 500 mL IVPB     1,500 mg 250 mL/hr over 120 Minutes Intravenous  Once 02/10/17 1834 02/10/17 2120   02/10/17 1445  cefTRIAXone (ROCEPHIN) 1 g in dextrose 5 % 50 mL IVPB  Status:  Discontinued     1 g 100 mL/hr over 30 Minutes Intravenous  Once 02/10/17 1441 02/10/17 1444   02/10/17 1445  piperacillin-tazobactam (ZOSYN) IVPB 3.375 g     3.375 g 100 mL/hr over 30 Minutes Intravenous  Once 02/10/17 1444 02/10/17 1553     Subjective: Seen and examined at bedside this AM and felt better but had some labored breathing. No CP But felt labored. No nausea or vomiting.   Objective: Vitals:   02/17/17  2314 02/18/17 0145 02/18/17 0300 02/18/17 0333  BP: (!) 112/57  (!) 115/58 (!) 115/58  Pulse: 67 87 69 73  Resp: (!) 23 (!) 30 (!) 25 (!) 23  Temp: 98.9 F (37.2 C)   98.1 F (36.7 C)  TempSrc: Oral   Oral  SpO2: 99%   98%  Weight:      Height:        Intake/Output Summary (Last 24 hours) at 02/18/2017 0736 Last data filed at 02/18/2017 0545 Gross per 24 hour  Intake 240 ml  Output 2600 ml  Net -2360 ml   Filed Weights   02/15/17 0431 02/16/17 0501 02/17/17 0434  Weight: 84.2 kg (185 lb 10 oz) 77.2 kg (170 lb 1.6 oz) 76 kg (167 lb 8.8 oz)   Examination: Physical Exam:  Constitutional: WN/WD Caucasian male in NAD; Appears dyspneic  Eyes: Sclerae anicteric; Lids Normal ENMT: External Ears and nose appear normal. MMM Neck: Supple with no JVD Respiratory: Diminished with some coarse breath sounds and crackles. Had some labored  breathing.  Cardiovascular: RRR slightly tachycardic. 1+ LE Edema Abdomen: Soft, NT, ND. Bowel sounds present GU: Deferred Musculoskeletal: No contractures; No cyanosis Skin: Warm and Dry; No appreciable rashes or lesions Neurologic: CN 2-12 grossly intact. No appreciable focal deficits Psychiatric: Slightly anxious appearing. Intact judgement and insight  Data Reviewed: I have personally reviewed following labs and imaging studies  CBC: Recent Labs  Lab 02/15/17 0711 02/16/17 0608 02/17/17 0606 02/17/17 1159 02/18/17 0338  WBC 12.4* 11.0* 11.4* 13.3* 10.1  NEUTROABS  --  9.4* 10.5*  --  8.8*  HGB 8.2* 8.4* 8.7* 9.0* 9.0*  HCT 25.2* 26.0* 26.4* 27.5* 27.7*  MCV 95.1 95.9 96.0 96.2 96.5  PLT 65* 62* 62* 59* 57*   Basic Metabolic Panel: Recent Labs  Lab 02/14/17 0607 02/15/17 0711 02/16/17 0608 02/17/17 0606 02/18/17 0338  NA 136 137 137 139 137  K 2.9* 4.1 3.5 3.3* 3.2*  CL 99* 101 99* 94* 90*  CO2 '22 23 26 '$ 32 33*  GLUCOSE 106* 141* 184* 131* 156*  BUN 25* 26* 27* 23* 28*  CREATININE 1.46* 1.20 1.09 1.21 1.27*  CALCIUM 8.7* 8.7*  9.2 8.9 8.9  MG 2.1  --  2.2 2.1 2.0  PHOS  --   --  3.2 3.1 3.1   GFR: Estimated Creatinine Clearance: 44.8 mL/min (A) (by C-G formula based on SCr of 1.27 mg/dL (H)). Liver Function Tests: Recent Labs  Lab 02/12/17 1158 02/16/17 0608 02/17/17 0606 02/18/17 0338  AST 34 '28 25 27  '$ ALT 15* '22 20 20  '$ ALKPHOS 53 74 76 80  BILITOT 1.4* 1.0 1.1 1.3*  PROT 6.0* 6.7 6.3* 6.9  ALBUMIN 2.7* 2.8* 2.6* 2.7*   No results for input(s): LIPASE, AMYLASE in the last 168 hours. No results for input(s): AMMONIA in the last 168 hours. Coagulation Profile: Recent Labs  Lab 02/12/17 1158  INR 1.31   Cardiac Enzymes: No results for input(s): CKTOTAL, CKMB, CKMBINDEX, TROPONINI in the last 168 hours. BNP (last 3 results) No results for input(s): PROBNP in the last 8760 hours. HbA1C: No results for input(s): HGBA1C in the last 72 hours. CBG: Recent Labs  Lab 02/16/17 2130 02/17/17 0747 02/17/17 1222 02/17/17 1638 02/17/17 2114  GLUCAP 216* 123* 143* 210* 164*   Lipid Profile: No results for input(s): CHOL, HDL, LDLCALC, TRIG, CHOLHDL, LDLDIRECT in the last 72 hours. Thyroid Function Tests: No results for input(s): TSH, T4TOTAL, FREET4, T3FREE, THYROIDAB in the last 72 hours. Anemia Panel: No results for input(s): VITAMINB12, FOLATE, FERRITIN, TIBC, IRON, RETICCTPCT in the last 72 hours. Sepsis Labs: No results for input(s): PROCALCITON, LATICACIDVEN in the last 168 hours.  Recent Results (from the past 240 hour(s))  Urine culture     Status: None   Collection Time: 02/10/17 12:46 PM  Result Value Ref Range Status   Specimen Description URINE, CATHETERIZED  Final   Special Requests NONE  Final   Culture   Final    NO GROWTH Performed at [facility], [address] [place], [place] [address]    Report Status 02/12/2017 FINAL  Final  Blood culture (routine x 2)     Status: None   Collection Time: 02/10/17  3:25 PM  Result Value Ref Range Status   Specimen Description  BLOOD RIGHT FOREARM  Final   Special Requests   Final    BOTTLES DRAWN AEROBIC AND ANAEROBIC Blood Culture adequate volume   Culture NO GROWTH 5 DAYS  Final   Report Status 02/15/2017  FINAL  Final  Blood culture (routine x 2)     Status: None   Collection Time: 02/10/17  3:37 PM  Result Value Ref Range Status   Specimen Description LEFT ANTECUBITAL  Final   Special Requests   Final    BOTTLES DRAWN AEROBIC AND ANAEROBIC Blood Culture adequate volume   Culture NO GROWTH 5 DAYS  Final   Report Status 02/15/2017 FINAL  Final  MRSA PCR Screening     Status: None   Collection Time: 02/14/17  7:49 AM  Result Value Ref Range Status   MRSA by PCR NEGATIVE NEGATIVE Final    Comment:        The GeneXpert MRSA Assay (FDA approved for NASAL specimens only), is one component of a comprehensive MRSA colonization surveillance program. It is not intended to diagnose MRSA infection nor to guide or monitor treatment for MRSA infections.     Radiology Studies: Dg Chest Port 1 View  Result Date: 02/17/2017 CLINICAL DATA:  Congestive heart failure with worsened shortness of breath. EXAM: PORTABLE CHEST 1 VIEW COMPARISON:  02/16/2017.  02/13/2017. FINDINGS: Persistent interstitial edema in the lower lungs with small effusions and basilar volume loss. Upper lungs largely clear. Similar appearance to the previous exam. This may be slightly worsened radiographically. IMPRESSION: Similar appearance with interstitial edema and volume loss in the lower lungs. Possibly slightly worsened. Electronically Signed   By: Nelson Chimes M.D.   On: 02/17/2017 10:51   Scheduled Meds: . amoxicillin-clavulanate  1 tablet Oral Q12H  . furosemide  80 mg Intravenous BID  . guaiFENesin  1,200 mg Oral BID  . insulin aspart  0-9 Units Subcutaneous TID WC  . levalbuterol  0.63 mg Nebulization TID  . levothyroxine  137 mcg Oral QAC breakfast  . methylPREDNISolone (SOLU-MEDROL) injection  40 mg Intravenous Daily  .  metoprolol succinate  100 mg Oral Daily  . mometasone-formoterol  2 puff Inhalation BID  . pantoprazole  40 mg Oral Daily  . rosuvastatin  40 mg Oral q1800   Continuous Infusions: . sodium chloride      LOS: 8 days   Kerney Elbe, DO [facility] Pager [phone]  If 7PM-7AM, please contact night-coverage [email or web address] Password [facility] 02/18/2017, 7:36 AM

## 2017-02-18 NOTE — Progress Notes (Deleted)
Progress Note  Patient Name: Dillon Sweeney Date of Encounter: 02/18/2017  Primary Cardiologist: Dorris Carnes, MD -New. Wants to follow up in [place].  Subjective   Diuresed about 2.4L negative overnight. Creatinine up slightly.  Inpatient Medications    Scheduled Meds: . amoxicillin-clavulanate  1 tablet Oral Q12H  . furosemide  80 mg Intravenous BID  . guaiFENesin  1,200 mg Oral BID  . insulin aspart  0-9 Units Subcutaneous TID WC  . levalbuterol  0.63 mg Nebulization TID  . levothyroxine  137 mcg Oral QAC breakfast  . methylPREDNISolone (SOLU-MEDROL) injection  40 mg Intravenous Daily  . metoprolol succinate  100 mg Oral Daily  . mometasone-formoterol  2 puff Inhalation BID  . pantoprazole  40 mg Oral Daily  . rosuvastatin  40 mg Oral q1800   Continuous Infusions: . sodium chloride     PRN Meds: acetaminophen, benzonatate, haloperidol lactate, levalbuterol, ondansetron **OR** ondansetron (ZOFRAN) IV   Vital Signs    Vitals:   02/18/17 1100 02/18/17 1127 02/18/17 1155 02/18/17 1200  BP:   (!) 149/70   Pulse:  91 92   Resp:  (!) 27 (!) 26   Temp:   98 F (36.7 C)   TempSrc:   Oral   SpO2: 96% 96% 97% 97%  Weight:      Height:        Intake/Output Summary (Last 24 hours) at 02/18/2017 1416 Last data filed at 02/18/2017 1157 Gross per 24 hour  Intake 340 ml  Output 2350 ml  Net -2010 ml   Filed Weights   02/15/17 0431 02/16/17 0501 02/17/17 0434  Weight: 185 lb 10 oz (84.2 kg) 170 lb 1.6 oz (77.2 kg) 167 lb 8.8 oz (76 kg)    Telemetry    Sinus rhythm - Personally Reviewed  ECG    N/A  Physical Exam   General appearance: alert and no distress Neck: JVD - 7 cm above sternal notch, supple, symmetrical, trachea midline and thyroid not enlarged, symmetric, no tenderness/mass/nodules Lungs: diminished breath sounds bilaterally Heart: regular rate and rhythm Abdomen: soft, non-tender; bowel sounds normal; no masses,  no organomegaly Extremities: edema  none Pulses: 2+ and symmetric Skin: Skin color, texture, turgor normal. No rashes or lesions Neurologic: Mental status: Awake, responds to commands, was confused earlier and given haldol Psych: calm   Labs    Chemistry Recent Labs  Lab 02/16/17 0608 02/17/17 0606 02/18/17 0338  NA 137 139 137  K 3.5 3.3* 3.2*  CL 99* 94* 90*  CO2 26 32 33*  GLUCOSE 184* 131* 156*  BUN 27* 23* 28*  CREATININE 1.09 1.21 1.27*  CALCIUM 9.2 8.9 8.9  PROT 6.7 6.3* 6.9  ALBUMIN 2.8* 2.6* 2.7*  AST 28 25 27   ALT 22 20 20   ALKPHOS 74 76 80  BILITOT 1.0 1.1 1.3*  GFRNONAA >60 54* 51*  GFRAA >60 >60 59*  ANIONGAP 12 13 14      Hematology Recent Labs  Lab 02/17/17 0606 02/17/17 1159 02/18/17 0338  WBC 11.4* 13.3* 10.1  RBC 2.75* 2.86* 2.87*  HGB 8.7* 9.0* 9.0*  HCT 26.4* 27.5* 27.7*  MCV 96.0 96.2 96.5  MCH 31.6 31.5 31.4  MCHC 33.0 32.7 32.5  RDW 22.3* 22.3* 22.1*  PLT 62* 59* 57*    Cardiac Enzymes No results for input(s): TROPONINI in the last 168 hours.  No results for input(s): TROPIPOC in the last 168 hours.   DDimer  Recent Labs  Lab 02/12/17 1158  DDIMER 3.22*     Radiology    Dg Chest Port 1 View  Result Date: 02/18/2017 CLINICAL DATA:  84 year old male admitted with chest pain and shortness of breath. Acute on chronic systolic heart failure, NSTEMI. EXAM: PORTABLE CHEST 1 VIEW COMPARISON:  02/17/2017 and earlier, including chest and abdomen CTA 02/10/2017 FINDINGS: Portable AP semi upright view at 0642 hours. Mediastinal contours are stable and normal aside from mild tortuosity of the thoracic aorta. Prior CABG Visualized tracheal air column is within normal limits. Regressed bilateral pulmonary interstitial opacity and bilateral pleural effusions with improved lung base ventilation. Superimposed attenuation of perihilar and right upper lobe bronchovascular markings corresponding to emphysema seen on recent chest CTA. No new pulmonary opacity.  No pneumothorax.  IMPRESSION: 1. Improved ventilation with regression of bilateral pulmonary edema and pleural effusions since 02/17/2017. 2. Underlying emphysema. Electronically Signed   By: Genevie Ann M.D.   On: 02/18/2017 07:47   Dg Chest Port 1 View  Result Date: 02/17/2017 CLINICAL DATA:  Congestive heart failure with worsened shortness of breath. EXAM: PORTABLE CHEST 1 VIEW COMPARISON:  02/16/2017.  02/13/2017. FINDINGS: Persistent interstitial edema in the lower lungs with small effusions and basilar volume loss. Upper lungs largely clear. Similar appearance to the previous exam. This may be slightly worsened radiographically. IMPRESSION: Similar appearance with interstitial edema and volume loss in the lower lungs. Possibly slightly worsened. Electronically Signed   By: Nelson Chimes M.D.   On: 02/17/2017 10:51    Cardiac Studies   Echocardiogram 02/13/2017 Study Conclusions - Left ventricle: The cavity size was normal. Wall thickness was   normal. Systolic function was moderately reduced. The estimated   ejection fraction was in the range of 35% to 40%. Diffuse   hypokinesis. - Mitral valve: There was mild to moderate regurgitation directed   centrally. - Left atrium: The atrium was mildly dilated. - Right ventricle: The cavity size was mildly dilated. Systolic   function was moderately reduced. - Right atrium: The atrium was mildly dilated. - Tricuspid valve: There was moderate regurgitation. - Pulmonary arteries: Systolic pressure was mildly increased. PA   peak pressure: 40 mm Hg (S).  Patient Profile     84 y.o. male with history of CAD (s/p CABG in 57), AAA (s/p repair), DM, HTN Presented to [facility] with CP and SOB Trop elevated 1.3 Hgb 8.4 decreased to 7.  Assessment & Plan    1. Acute on chronic systolic heart failure -  Wt down to 167 lbs but resp effort is increased and dense rales on exam.  - recheck CXR - RR called, pt tx to stepdown - breathing better on BiPAP - EF 35-40% w/  diffuse HK - no output charted for 01/16, net neg 2L 01/17 - supp K+ - continue Lasix 80 mg IV BID, now off BIPAP, sats good - on BB, no spiro, ACE/ARB for now, may be Entresto candidate  2. CAD/NSTEMI -CAD s/p CABG (1986):  - felt demand ischemia - no sx despite worsening resp status - Peak trop 1.38 - EF 35-40%, no WMA - ASA only 2nd anemia -no additional wkup planned - continue BB/statin  3. Atrial fibrillation   EKG on 1/14 read out as atrial fibrillation  On closer review it may be SR with PACs  (baseline P wave morphology difficult  Small)  - appears to be in sinus  4. Anemia  Hgb 8.7    -  5. HTN:   BP is OK - no med changes  6. DM: - per IM  7. Renal:   - peak Cr 1.46 - continue to follow  8. Likely underlying COPD:  - per IM - on nebs, ABX, taper steroids   For questions or updates, please contact [place] Please consult [email or web address] for contact info under Cardiology/STEMI.     Pixie Casino, MD, [facility] [place] Director of the [facility]  Card[facility] Diplomate of the American Board of Clinical Lipidology Attending Cardiologist  Direct Dial: [phone]  Fax: [phone]  Website:  [email or web address]  02/18/2017, 2:16 PM

## 2017-02-18 NOTE — Progress Notes (Signed)
Progress Note  Patient Name: Dillon Sweeney Date of Encounter: 02/18/2017  Primary Cardiologist: Dorris Carnes, MD -[place]. Wants to follow up in [place].  Subjective   Dyspnea on bipap no chest pain Spoke with wife and daughter   Inpatient Medications    Scheduled Meds: . amoxicillin-clavulanate  1 tablet Oral Q12H  . furosemide  80 mg Intravenous BID  . guaiFENesin  1,200 mg Oral BID  . insulin aspart  0-9 Units Subcutaneous TID WC  . levalbuterol  0.63 mg Nebulization TID  . levothyroxine  137 mcg Oral QAC breakfast  . methylPREDNISolone (SOLU-MEDROL) injection  40 mg Intravenous Daily  . metoprolol succinate  100 mg Oral Daily  . mometasone-formoterol  2 puff Inhalation BID  . pantoprazole  40 mg Oral Daily  . rosuvastatin  40 mg Oral q1800   Continuous Infusions: . sodium chloride     PRN Meds: acetaminophen, benzonatate, haloperidol lactate, levalbuterol, ondansetron **OR** ondansetron (ZOFRAN) IV   Vital Signs    Vitals:   02/18/17 1100 02/18/17 1127 02/18/17 1155 02/18/17 1200  BP:   (!) 149/70   Pulse:  91 92   Resp:  (!) 27 (!) 26   Temp:   98 F (36.7 C)   TempSrc:   Oral   SpO2: 96% 96% 97% 97%  Weight:      Height:        Intake/Output Summary (Last 24 hours) at 02/18/2017 1417 Last data filed at 02/18/2017 1157 Gross per 24 hour  Intake 340 ml  Output 2350 ml  Net -2010 ml   Filed Weights   02/15/17 0431 02/16/17 0501 02/17/17 0434  Weight: 185 lb 10 oz (84.2 kg) 170 lb 1.6 oz (77.2 kg) 167 lb 8.8 oz (76 kg)    Telemetry    SR PVCls no NSVT 02/18/2017 - Personally Reviewed  ECG   SR PR 88 ICRBBB old IMI    Physical Exam   Bipap Pale chronically ill white male  HEENT: normal Neck supple with no adenopathy JVP normal no bruits no thyromegaly Lungs wheezy right basilar crackles  Heart:  S1/S2 MR murmur, no rub, gallop or click PMI normal Abdomen: post AAA repair  Distal pulses intact with no bruits No edema Neuro non-focal Skin warm  and dry No muscular weakness   Labs    Chemistry Recent Labs  Lab 02/16/17 0608 02/17/17 0606 02/18/17 0338  NA 137 139 137  K 3.5 3.3* 3.2*  CL 99* 94* 90*  CO2 26 32 33*  GLUCOSE 184* 131* 156*  BUN 27* 23* 28*  CREATININE 1.09 1.21 1.27*  CALCIUM 9.2 8.9 8.9  PROT 6.7 6.3* 6.9  ALBUMIN 2.8* 2.6* 2.7*  AST 28 25 27   ALT 22 20 20   ALKPHOS 74 76 80  BILITOT 1.0 1.1 1.3*  GFRNONAA >60 54* 51*  GFRAA >60 >60 59*  ANIONGAP 12 13 14      Hematology Recent Labs  Lab 02/17/17 0606 02/17/17 1159 02/18/17 0338  WBC 11.4* 13.3* 10.1  RBC 2.75* 2.86* 2.87*  HGB 8.7* 9.0* 9.0*  HCT 26.4* 27.5* 27.7*  MCV 96.0 96.2 96.5  MCH 31.6 31.5 31.4  MCHC 33.0 32.7 32.5  RDW 22.3* 22.3* 22.1*  PLT 62* 59* 57*    Cardiac Enzymes No results for input(s): TROPONINI in the last 168 hours.  No results for input(s): TROPIPOC in the last 168 hours.   DDimer  Recent Labs  Lab 02/12/17 1158  DDIMER 3.22*  Radiology    Dg Chest Port 1 View  Result Date: 02/18/2017 CLINICAL DATA:  84 year old male admitted with chest pain and shortness of breath. Acute on chronic systolic heart failure, NSTEMI. EXAM: PORTABLE CHEST 1 VIEW COMPARISON:  02/17/2017 and earlier, including chest and abdomen CTA 02/10/2017 FINDINGS: Portable AP semi upright view at 0642 hours. Mediastinal contours are stable and normal aside from mild tortuosity of the thoracic aorta. Prior CABG Visualized tracheal air column is within normal limits. Regressed bilateral pulmonary interstitial opacity and bilateral pleural effusions with improved lung base ventilation. Superimposed attenuation of perihilar and right upper lobe bronchovascular markings corresponding to emphysema seen on recent chest CTA. No new pulmonary opacity.  No pneumothorax. IMPRESSION: 1. Improved ventilation with regression of bilateral pulmonary edema and pleural effusions since 02/17/2017. 2. Underlying emphysema. Electronically Signed   By: Genevie Ann  M.D.   On: 02/18/2017 07:47   Dg Chest Port 1 View  Result Date: 02/17/2017 CLINICAL DATA:  Congestive heart failure with worsened shortness of breath. EXAM: PORTABLE CHEST 1 VIEW COMPARISON:  02/16/2017.  02/13/2017. FINDINGS: Persistent interstitial edema in the lower lungs with small effusions and basilar volume loss. Upper lungs largely clear. Similar appearance to the previous exam. This may be slightly worsened radiographically. IMPRESSION: Similar appearance with interstitial edema and volume loss in the lower lungs. Possibly slightly worsened. Electronically Signed   By: Nelson Chimes M.D.   On: 02/17/2017 10:51    Cardiac Studies   Echocardiogram 02/13/2017 Study Conclusions - Left ventricle: The cavity size was normal. Wall thickness was   normal. Systolic function was moderately reduced. The estimated   ejection fraction was in the range of 35% to 40%. Diffuse   hypokinesis. - Mitral valve: There was mild to moderate regurgitation directed   centrally. - Left atrium: The atrium was mildly dilated. - Right ventricle: The cavity size was mildly dilated. Systolic   function was moderately reduced. - Right atrium: The atrium was mildly dilated. - Tricuspid valve: There was moderate regurgitation. - Pulmonary arteries: Systolic pressure was mildly increased. PA   peak pressure: 40 mm Hg (S).  Patient Profile     84 y.o. male with history of CAD (s/p CABG in 34), AAA (s/p repair), DM, HTN Presented to [facility] with CP and SOB Trop elevated 1.3 Hgb 8.4 decreased to 7.  Assessment & Plan    1. Acute on chronic systolic heart failure -  CXR this am improved but clinical status not continue iv bid lasix and bipap - will ask pulmonary/CCM to see as Emphysema playing role check ABG  2. CAD/NSTEMI -CAD s/p CABG (1986):  - mild bump in troponin no acute ECG changes no plans for cath change lopressor to coreg given CHF   3. Atrial fibrillation   NSR with PVCls now   4. Anemia   Seen by Lindi Adie has had transfusion. Smear with no hemolysis DIC negative  Also with low PLT;s ? BM suppression or autoimmune They indicated transfuse as needed No evidence of GI bleeding   5. HTN:   BP is OK - no med changes  6. DM: - per IM  7. Renal:   - peak Cr 1.46 - continue to follow  8. Likely underlying COPD:  - per IM - on nebs, ABX, taper steroids   For questions or updates, please contact [place] Please consult [email or web address] for contact info under Cardiology/STEMI.      Jenkins Rouge

## 2017-02-18 NOTE — Progress Notes (Signed)
Pulled out bipap, desats to 80's, placed back on.explained to him the needs for bipap.  Haldol 2mg  iv given. Continue to monitor.

## 2017-02-19 ENCOUNTER — Inpatient Hospital Stay (HOSPITAL_COMMUNITY): Payer: Medicare Other

## 2017-02-19 ENCOUNTER — Encounter (HOSPITAL_COMMUNITY): Payer: Self-pay | Admitting: Pulmonary Disease

## 2017-02-19 DIAGNOSIS — J9601 Acute respiratory failure with hypoxia: Secondary | ICD-10-CM | POA: Diagnosis not present

## 2017-02-19 DIAGNOSIS — J439 Emphysema, unspecified: Secondary | ICD-10-CM

## 2017-02-19 HISTORY — DX: Emphysema, unspecified: J43.9

## 2017-02-19 LAB — CBC WITH DIFFERENTIAL/PLATELET
BLASTS: 0 %
Band Neutrophils: 0 %
Basophils Absolute: 0 10*3/uL (ref 0.0–0.1)
Basophils Relative: 0 %
Eosinophils Absolute: 0 10*3/uL (ref 0.0–0.7)
Eosinophils Relative: 0 %
HEMATOCRIT: 28.8 % — AB (ref 39.0–52.0)
HEMOGLOBIN: 9.4 g/dL — AB (ref 13.0–17.0)
LYMPHS PCT: 5 %
Lymphs Abs: 0.5 10*3/uL — ABNORMAL LOW (ref 0.7–4.0)
MCH: 31.1 pg (ref 26.0–34.0)
MCHC: 32.6 g/dL (ref 30.0–36.0)
MCV: 95.4 fL (ref 78.0–100.0)
Metamyelocytes Relative: 0 %
Monocytes Absolute: 0.1 10*3/uL (ref 0.1–1.0)
Monocytes Relative: 1 %
Myelocytes: 0 %
NEUTROS PCT: 94 %
NRBC: 0 /100{WBCs}
Neutro Abs: 9.7 10*3/uL — ABNORMAL HIGH (ref 1.7–7.7)
OTHER: 0 %
Platelets: 54 10*3/uL — ABNORMAL LOW (ref 150–400)
Promyelocytes Absolute: 0 %
RBC: 3.02 MIL/uL — ABNORMAL LOW (ref 4.22–5.81)
RDW: 22 % — ABNORMAL HIGH (ref 11.5–15.5)
WBC: 10.3 10*3/uL (ref 4.0–10.5)

## 2017-02-19 LAB — MAGNESIUM: Magnesium: 1.9 mg/dL (ref 1.7–2.4)

## 2017-02-19 LAB — COMPREHENSIVE METABOLIC PANEL
ALBUMIN: 2.7 g/dL — AB (ref 3.5–5.0)
ALK PHOS: 83 U/L (ref 38–126)
ALT: 20 U/L (ref 17–63)
AST: 30 U/L (ref 15–41)
Anion gap: 14 (ref 5–15)
BILIRUBIN TOTAL: 1.3 mg/dL — AB (ref 0.3–1.2)
BUN: 33 mg/dL — ABNORMAL HIGH (ref 6–20)
CALCIUM: 9 mg/dL (ref 8.9–10.3)
CO2: 33 mmol/L — ABNORMAL HIGH (ref 22–32)
CREATININE: 1.24 mg/dL (ref 0.61–1.24)
Chloride: 88 mmol/L — ABNORMAL LOW (ref 101–111)
GFR calc Af Amer: >60 mL/min (ref >60)
GFR calc non Af Amer: 52 mL/min — ABNORMAL LOW (ref >60)
GLUCOSE: 146 mg/dL — AB (ref 65–99)
Potassium: 3.1 mmol/L — ABNORMAL LOW (ref 3.5–5.1)
Sodium: 135 mmol/L (ref 135–145)
TOTAL PROTEIN: 7.1 g/dL (ref 6.5–8.1)

## 2017-02-19 LAB — BLOOD GAS, ARTERIAL
Acid-Base Excess: 13.7 mmol/L — ABNORMAL HIGH (ref 0.0–2.0)
BICARBONATE: 38 mmol/L — AB (ref 20.0–28.0)
O2 CONTENT: 9 L/min
O2 Saturation: 88.6 %
PATIENT TEMPERATURE: 98.4
PH ART: 7.496 — AB (ref 7.350–7.450)
pCO2 arterial: 49.6 mmHg — ABNORMAL HIGH (ref 32.0–48.0)
pO2, Arterial: 58.2 mmHg — ABNORMAL LOW (ref 83.0–108.0)

## 2017-02-19 LAB — GLUCOSE, CAPILLARY
GLUCOSE-CAPILLARY: 140 mg/dL — AB (ref 65–99)
Glucose-Capillary: 177 mg/dL — ABNORMAL HIGH (ref 65–99)
Glucose-Capillary: 207 mg/dL — ABNORMAL HIGH (ref 65–99)
Glucose-Capillary: 252 mg/dL — ABNORMAL HIGH (ref 65–99)

## 2017-02-19 LAB — PHOSPHORUS: Phosphorus: 2.9 mg/dL (ref 2.5–4.6)

## 2017-02-19 MED ORDER — ARFORMOTEROL TARTRATE 15 MCG/2ML IN NEBU
15.0000 ug | INHALATION_SOLUTION | Freq: Two times a day (BID) | RESPIRATORY_TRACT | Status: DC
  Administered 2017-02-19 – 2017-02-20 (×3): 15 ug via RESPIRATORY_TRACT
  Filled 2017-02-19 (×3): qty 2

## 2017-02-19 MED ORDER — PREDNISONE 20 MG PO TABS
40.0000 mg | ORAL_TABLET | Freq: Every day | ORAL | Status: DC
  Administered 2017-02-19 – 2017-02-21 (×2): 40 mg via ORAL
  Filled 2017-02-19 (×2): qty 2

## 2017-02-19 MED ORDER — POTASSIUM CHLORIDE CRYS ER 20 MEQ PO TBCR
40.0000 meq | EXTENDED_RELEASE_TABLET | Freq: Two times a day (BID) | ORAL | Status: DC
  Administered 2017-02-19: 40 meq via ORAL
  Filled 2017-02-19: qty 2

## 2017-02-19 MED ORDER — BUDESONIDE 0.25 MG/2ML IN SUSP
0.2500 mg | Freq: Two times a day (BID) | RESPIRATORY_TRACT | Status: DC
  Administered 2017-02-19 – 2017-02-20 (×3): 0.25 mg via RESPIRATORY_TRACT
  Filled 2017-02-19 (×3): qty 2

## 2017-02-19 MED ORDER — FUROSEMIDE 10 MG/ML IJ SOLN: 40.0000 mg | Freq: Once | INTRAMUSCULAR | Status: DC

## 2017-02-19 MED ORDER — DEXTROSE 5 % IV SOLN
1.0000 g | Freq: Two times a day (BID) | INTRAVENOUS | Status: DC
  Administered 2017-02-19 – 2017-02-20 (×2): 1 g via INTRAVENOUS
  Filled 2017-02-19 (×4): qty 1

## 2017-02-19 NOTE — Progress Notes (Signed)
Pt increased work of breathing, RR 35-40. sats 90s, 10L HFNC, ST. RT placed pt back on BiPAP. Pt tolerating well. Will continue to monitor pt.

## 2017-02-19 NOTE — Progress Notes (Signed)
Progress Note  Patient Name: Dillon Sweeney Date of Encounter: 02/19/2017  Primary Cardiologist: Dorris Carnes, MD -[place]. Wants to follow up in [place].  Subjective   Doing better off bipap and talking able to take PO. Despite good diuresis and clearing CHF on CXR still with severe emphysema and sats drop with little activity  Inpatient Medications    Scheduled Meds: . carvedilol  6.25 mg Oral BID WC  . furosemide  40 mg Intravenous Once  . furosemide  80 mg Intravenous BID  . guaiFENesin  1,200 mg Oral BID  . insulin aspart  0-9 Units Subcutaneous TID WC  . levalbuterol  0.63 mg Nebulization TID  . levothyroxine  137 mcg Oral QAC breakfast  . mometasone-formoterol  2 puff Inhalation BID  . pantoprazole  40 mg Oral Daily  . potassium chloride  40 mEq Oral BID  . predniSONE  40 mg Oral Q breakfast  . rosuvastatin  40 mg Oral q1800   Continuous Infusions: . sodium chloride     PRN Meds: acetaminophen, benzonatate, haloperidol lactate, levalbuterol, ondansetron **OR** ondansetron (ZOFRAN) IV   Vital Signs    Vitals:   02/19/17 0750 02/19/17 0800 02/19/17 0820 02/19/17 1124  BP:  123/61  126/64  Pulse:  71 93   Resp:  (!) 22    Temp:  98.3 F (36.8 C)  98.4 F (36.9 C)  TempSrc:  Oral  Oral  SpO2: 97% 96%  91%  Weight:      Height:        Intake/Output Summary (Last 24 hours) at 02/19/2017 1149 Last data filed at 02/19/2017 0800 Gross per 24 hour  Intake 120 ml  Output 2550 ml  Net -2430 ml   Filed Weights   02/15/17 0431 02/16/17 0501 02/17/17 0434  Weight: 185 lb 10 oz (84.2 kg) 170 lb 1.6 oz (77.2 kg) 167 lb 8.8 oz (76 kg)    Telemetry    SR PVCls no NSVT 02/19/2017 - Personally Reviewed  ECG   SR PR 37 ICRBBB old IMI    Physical Exam   Pale chronically ill white male  HEENT: normal Neck supple with no adenopathy JVP normal no bruits no thyromegaly Lungs wheezy right basilar crackles  Heart:  S1/S2 MR murmur, no rub, gallop or click PMI  normal Abdomen: post AAA repair  Distal pulses intact with no bruits No edema Neuro non-focal Skin warm and dry No muscular weakness   Labs    Chemistry Recent Labs  Lab 02/17/17 0606 02/18/17 0338 02/19/17 0345  NA 139 137 135  K 3.3* 3.2* 3.1*  CL 94* 90* 88*  CO2 32 33* 33*  GLUCOSE 131* 156* 146*  BUN 23* 28* 33*  CREATININE 1.21 1.27* 1.24  CALCIUM 8.9 8.9 9.0  PROT 6.3* 6.9 7.1  ALBUMIN 2.6* 2.7* 2.7*  AST 25 27 30   ALT 20 20 20   ALKPHOS 76 80 83  BILITOT 1.1 1.3* 1.3*  GFRNONAA 54* 51* 52*  GFRAA >60 59* >60  ANIONGAP 13 14 14      Hematology Recent Labs  Lab 02/17/17 1159 02/18/17 0338 02/19/17 0345  WBC 13.3* 10.1 10.3  RBC 2.86* 2.87* 3.02*  HGB 9.0* 9.0* 9.4*  HCT 27.5* 27.7* 28.8*  MCV 96.2 96.5 95.4  MCH 31.5 31.4 31.1  MCHC 32.7 32.5 32.6  RDW 22.3* 22.1* 22.0*  PLT 59* 57* 54*    Cardiac Enzymes No results for input(s): TROPONINI in the last 168 hours.  No results  for input(s): TROPIPOC in the last 168 hours.   DDimer  Recent Labs  Lab 02/12/17 1158  DDIMER 3.22*     Radiology    Dg Chest Port 1 View  Result Date: 02/19/2017 CLINICAL DATA:  84 year old male with shortness of breath. EXAM: PORTABLE CHEST 1 VIEW COMPARISON:  Chest x-ray 02/18/2017. FINDINGS: Patchy areas of septal thickening and peribronchial cuffing noted throughout the lungs bilaterally, most evident in the mid to lower lungs, similar to the prior examination. No confluent consolidative airspace disease. No pleural effusions. No evidence of pulmonary edema. Heart size is normal. Upper mediastinal contours are within normal limits. Aortic atherosclerosis. Status post median sternotomy. IMPRESSION: 1. Minimal residual interstitial pulmonary edema again noted throughout the mid to lower lungs bilaterally. 2. Aortic atherosclerosis. Electronically Signed   By: Vinnie Langton M.D.   On: 02/19/2017 09:24   Dg Chest Port 1 View  Result Date: 02/18/2017 CLINICAL DATA:   84 year old male admitted with chest pain and shortness of breath. Acute on chronic systolic heart failure, NSTEMI. EXAM: PORTABLE CHEST 1 VIEW COMPARISON:  02/17/2017 and earlier, including chest and abdomen CTA 02/10/2017 FINDINGS: Portable AP semi upright view at 0642 hours. Mediastinal contours are stable and normal aside from mild tortuosity of the thoracic aorta. Prior CABG Visualized tracheal air column is within normal limits. Regressed bilateral pulmonary interstitial opacity and bilateral pleural effusions with improved lung base ventilation. Superimposed attenuation of perihilar and right upper lobe bronchovascular markings corresponding to emphysema seen on recent chest CTA. No new pulmonary opacity.  No pneumothorax. IMPRESSION: 1. Improved ventilation with regression of bilateral pulmonary edema and pleural effusions since 02/17/2017. 2. Underlying emphysema. Electronically Signed   By: Genevie Ann M.D.   On: 02/18/2017 07:47    Cardiac Studies   Echocardiogram 02/13/2017 Study Conclusions - Left ventricle: The cavity size was normal. Wall thickness was   normal. Systolic function was moderately reduced. The estimated   ejection fraction was in the range of 35% to 40%. Diffuse   hypokinesis. - Mitral valve: There was mild to moderate regurgitation directed   centrally. - Left atrium: The atrium was mildly dilated. - Right ventricle: The cavity size was mildly dilated. Systolic   function was moderately reduced. - Right atrium: The atrium was mildly dilated. - Tricuspid valve: There was moderate regurgitation. - Pulmonary arteries: Systolic pressure was mildly increased. PA   peak pressure: 40 mm Hg (S).  Patient Profile     84 y.o. male with history of CAD (s/p CABG in 3), AAA (s/p repair), DM, HTN Presented to [facility] with CP and SOB Trop elevated 1.3 Hgb 8.4 decreased to 7.  Assessment & Plan    1. Acute on chronic systolic heart failure -  CXR shows clearing he is now dry  continue lasix   2. CAD/NSTEMI -CAD s/p CABG (1986):  - mild bump in troponin no acute ECG changes no plans for cath change lopressor to coreg given CHF   3. Atrial fibrillation   NSR with PVCls now improved   4. Anemia  Seen by Lindi Adie has had transfusion. Smear with no hemolysis DIC negative  Also with low PLT;s ? BM suppression or autoimmune They indicated transfuse as needed No evidence of GI bleeding   5. HTN:   BP is OK - no med changes  6. DM: - per IM  7. Renal:   - peak Cr 1.46 - continue to follow  8. Likely underlying COPD:  - per IM - on  nebs, ABX, taper steroids will ask CCM/Pulmonary to see regarding severe emphysema   May need to address long term goals and palliative consult if lungs don't improve Discussed with daughter Wilburn Mylar and she indicated wanting to be full Code    For questions or updates, please contact [facility] Please consult [email or web address] for contact info under Cardiology/STEMI.      Jenkins Rouge

## 2017-02-19 NOTE — Consult Note (Addendum)
[facility] Pulmonary Diseases & Critical Care Medicine Initial Pulmonary Consultation  Patient Name: Dillon Sweeney MRN: [id number] DOB: 1933-01-23    ADMISSION DATE:  02/10/2017 CONSULTATION DATE:  02/19/2017  REFERRING MD:  Dr. Jenkins Rouge  REASON FOR CONSULTATION:  hypoxia   HISTORY OF PRESENT ILLNESS  This 84 y.o. Caucasian male reformed smoker is seen in consultation at the request of Dr. Jenkins Rouge for recommendations on further evaluation and management of hypoxia and COPD. He was initially admitted on 02/10/2017 after experienceing chest pain for 3 days prior to presentation. During his  ER evaluation, he was found to have elevated tropoinins. He was medically managed for NSTEMI.  The patient was previously established with [facility], but he reports that his doctor retired. He has not been back. He used to be on a maintenance regimen for COPD but has not been using aerosols or inhalers since his doctor retired. His daughter is at the bedside and reports that she has access to albuterol nebs, which she provided. They both agree that the nebs seemed to help. The patient does not recall if he has ever been assessed for need for long-term oxygen therapy. He endorses exertional dyspnea. Currently, he denies dyspnea at rest. He denies anginal chest pain but reports that he sometimes gets pains between his shoulders, typically in the back in one hemithorax or another.  REVIEW OF SYSTEMS: as highlighted above and in the HPI. Otherwise, the remainder of the balance of a review of 13 systems is negative.   PAST MEDICAL/SURGICAL/SOCIAL/FAMILY HISTORIES   Past Medical History:  Diagnosis Date  . AAA (abdominal aortic aneurysm) ([place])   . Atherosclerosis   . Bladder stones   . CAD (coronary artery disease)   . Cholelithiasis   . Chronic constipation   . Diabetes mellitus    Type 2  . Dyslipidemia   . Hyperlipidemia   . Hypertension   . Hypothyroidism   . Myocardial  infarction ([place]) 09/22/10   1986  . Pneumonia 2012    Past Surgical History:  Procedure Laterality Date  . ABDOMINAL AORTIC ANEURYSM REPAIR  09/15/10   Juxtarenal AAA repair   . APPENDECTOMY    . CORONARY ARTERY BYPASS GRAFT  1986   left leg vein harvest  . IR GENERIC HISTORICAL  03/04/2016   IR ANGIO VERTEBRAL SEL VERTEBRAL BILAT MOD SED 03/04/2016 Luanne Bras, MD [facility]  . IR GENERIC HISTORICAL  03/04/2016   IR ANGIO INTRA EXTRACRAN SEL COM CAROTID INNOMINATE BILAT MOD SED 03/04/2016 Luanne Bras, MD [facility]  . TONSILLECTOMY      Social History   Tobacco Use  . Smoking status: Former Smoker    Packs/day: 1.00    Types: Cigarettes    Last attempt to quit: 10/01/2016    Years since quitting: 0.3  . Smokeless tobacco: Never Used  Substance Use Topics  . Alcohol use: No    Family History  Problem Relation Age of Onset  . Stroke Father   . Cancer Sister      Allergies  Allergen Reactions  . Morphine And Related     hallucinations     Prior to Admission medications   Medication Sig Start Date End Date Taking? Authorizing Provider  acetaminophen (TYLENOL) 500 MG tablet Take 1,000 mg by mouth every 6 (six) hours as needed for mild pain or moderate pain.   Yes [provider]  amLODipine (NORVASC) 5 MG tablet Take 1 tablet (5 mg total) by mouth daily.  09/16/16  Yes Eustaquio Maize, MD  aspirin 81 MG tablet Take 81 mg by mouth daily.     Yes [provider]  benazepril (LOTENSIN) 40 MG tablet Take 1 tablet (40 mg total) by mouth daily. 09/16/16  Yes Eustaquio Maize, MD  Cholecalciferol (VITAMIN D) 2000 UNITS CAPS Take 1 capsule by mouth daily.    Yes [provider]  levothyroxine (SYNTHROID, LEVOTHROID) 150 MCG tablet Take 1 tablet (150 mcg total) by mouth daily. 09/16/16  Yes Eustaquio Maize, MD  metFORMIN (GLUCOPHAGE) 500 MG tablet Take 1 tablet (500 mg total) by mouth daily with breakfast. 09/16/16  Yes Eustaquio Maize, MD   metoprolol tartrate (LOPRESSOR) 100 MG tablet Take 1 tablet (100 mg total) by mouth daily. 09/16/16  Yes Eustaquio Maize, MD  rosuvastatin (CRESTOR) 40 MG tablet TAKE ONE TABLET BY MOUTH EVERY DAY 09/16/16  Yes Eustaquio Maize, MD    Current Facility-Administered Medications  Medication Dose Route Frequency Provider Last Rate Last Dose  . 0.9 %  sodium chloride infusion   Intravenous Once Isaac Bliss, Rayford Halsted, MD      . acetaminophen (TYLENOL) tablet 650 mg  650 mg Oral Q6H PRN Mariel Aloe, MD   650 mg at 02/19/17 1131  . benzonatate (TESSALON) capsule 200 mg  200 mg Oral TID PRN Gardiner Barefoot, NP   200 mg at 02/15/17 0300  . carvedilol (COREG) tablet 6.25 mg  6.25 mg Oral BID WC Josue Hector, MD   6.25 mg at 02/19/17 0820  . furosemide (LASIX) injection 40 mg  40 mg Intravenous Once Sheikh, Omair Latif, DO      . furosemide (LASIX) injection 80 mg  80 mg Intravenous BID Fay Records, MD   80 mg at 02/19/17 8242  . guaiFENesin (MUCINEX) 12 hr tablet 1,200 mg  1,200 mg Oral BID Raiford Noble Lucas, DO   1,200 mg at 02/19/17 3536  . haloperidol lactate (HALDOL) injection 2 mg  2 mg Intravenous Q6H PRN Mariel Aloe, MD   2 mg at 02/19/17 0325  . insulin aspart (novoLOG) injection 0-9 Units  0-9 Units Subcutaneous TID WC Emokpae, Ejiroghene E, MD   2 Units at 02/19/17 1229  . levalbuterol (XOPENEX) nebulizer solution 0.63 mg  0.63 mg Nebulization TID Mariel Aloe, MD   0.63 mg at 02/19/17 0750  . levalbuterol (XOPENEX) nebulizer solution 0.63 mg  0.63 mg Nebulization Q6H PRN Raiford Noble Latif, DO   0.63 mg at 02/19/17 0145  . levothyroxine (SYNTHROID, LEVOTHROID) tablet 137 mcg  137 mcg Oral QAC breakfast Mariel Aloe, MD   137 mcg at 02/19/17 [id number]  . mometasone-formoterol (DULERA) 200-5 MCG/ACT inhaler 2 puff  2 puff Inhalation BID Mariel Aloe, MD   2 puff at 02/19/17 0754  . ondansetron (ZOFRAN) tablet 4 mg  4 mg Oral Q6H PRN Emokpae, Ejiroghene E, MD       Or   . ondansetron (ZOFRAN) injection 4 mg  4 mg Intravenous Q6H PRN Emokpae, Ejiroghene E, MD      . pantoprazole (PROTONIX) EC tablet 40 mg  40 mg Oral Daily Mariel Aloe, MD   40 mg at 02/19/17 0835  . potassium chloride SA (K-DUR,KLOR-CON) CR tablet 40 mEq  40 mEq Oral BID Raiford Noble [place], DO   40 mEq at 02/19/17 1540  . predniSONE (DELTASONE) tablet 40 mg  40 mg Oral Q breakfast Reinbeck, Georgina Quint [place], [place]  40 mg at 02/19/17 0834  . rosuvastatin (CRESTOR) tablet 40 mg  40 mg Oral q1800 Emokpae, Ejiroghene E, MD   40 mg at 02/18/17 1642     VITAL SIGNS: BP 126/64 (BP Location: Right Arm)   Pulse 93   Temp 98.4 F (36.9 C) (Oral)   Resp (!) 22   Ht 5\' 9"  (1.753 m)   Wt 76 kg (167 lb 8.8 oz)   SpO2 91%   BMI 24.74 kg/m   HEMODYNAMICS:    VENTILATOR SETTINGS:    INTAKE / OUTPUT: I/O last 3 completed shifts: In: 39 [P.O.:460] Out: 3550 [Urine:3550]  PHYSICAL EXAMINATION: General:  Pleasant. Chronically ill appearing. Alert, awake and oriented to time, person and place. Cooperative. Normal affect. Head: normocephalic, atraumatic EYE: PERRLA, EOM intact, no scleral icterus, no pallor Nose: nares are patent. No polyps. No exudate. No sinus tenderness. Throat/Oral Cavity: Normal dentition. No oral thrush. No exudate. Mucous membranes are moist. No tonsillar enlargement. Neck: supple, no thyromegaly, no JVD, no lymphadenopathy. Trachea midline. Chest/Lung: symmetric in development and expansion. Diminished air entry. Scant crackles. Scattered rhonchi. Heart: Regular S1 and S2 without murmur, rub or gallop. Abdomen: soft, nontender, nondistended. Normoactive bowel sounds. n rebound. No guarding. Extremities: no pedal edema, no cyanosis, no clubbing. 2+ DP pulses Lymphatic: no cervical/axiallary/inguinal lymph nodes appreciated Skin:  No rash or lesion. NEURO: cranial nerves II-XII are grossly symmetric and physiologic. Babinski absent. No sensory deficit. No motor deficit. DTR  2+ @ RUE, 2+ @ LUE 2+ @ RLL,  2+ @ LLL. No cerebellar signs. Gait was not assessed.   LABS:  BMET Recent Labs  Lab 02/17/17 0606 02/18/17 0338 02/19/17 0345  NA 139 137 135  K 3.3* 3.2* 3.1*  CL 94* 90* 88*  CO2 32 33* 33*  BUN 23* 28* 33*  CREATININE 1.21 1.27* 1.24  GLUCOSE 131* 156* 146*    Electrolytes Recent Labs  Lab 02/17/17 0606 02/18/17 0338 02/19/17 0345  CALCIUM 8.9 8.9 9.0  MG 2.1 2.0 1.9  PHOS 3.1 3.1 2.9    CBC Recent Labs  Lab 02/17/17 1159 02/18/17 0338 02/19/17 0345  WBC 13.3* 10.1 10.3  HGB 9.0* 9.0* 9.4*  HCT 27.5* 27.7* 28.8*  PLT 59* 57* 54*    Coag's No results for input(s): APTT, INR in the last 168 hours.  Sepsis Markers No results for input(s): LATICACIDVEN, PROCALCITON, O2SATVEN in the last 168 hours.  ABG Recent Labs  Lab 02/17/17 1120 02/19/17 0938  PHART 7.501* 7.496*  PCO2ART 46.1 49.6*  PO2ART 87.2 58.2*    Liver Enzymes Recent Labs  Lab 02/17/17 0606 02/18/17 0338 02/19/17 0345  AST 25 27 30   ALT 20 20 20   ALKPHOS 76 80 83  BILITOT 1.1 1.3* 1.3*  ALBUMIN 2.6* 2.7* 2.7*    Cardiac Enzymes No results for input(s): TROPONINI, PROBNP in the last 168 hours.  Glucose Recent Labs  Lab 02/18/17 0805 02/18/17 1155 02/18/17 1629 02/18/17 2134 02/19/17 0804 02/19/17 1154  GLUCAP 124* 161* 265* 216* 140* 177*    Imaging Dg Chest Port 1 View  Result Date: 02/19/2017 CLINICAL DATA:  84 year old male with shortness of breath. EXAM: PORTABLE CHEST 1 VIEW COMPARISON:  Chest x-ray 02/18/2017. FINDINGS: Patchy areas of septal thickening and peribronchial cuffing noted throughout the lungs bilaterally, most evident in the mid to lower lungs, similar to the prior examination. No confluent consolidative airspace disease. No pleural effusions. No evidence of pulmonary edema. Heart size is normal. Upper mediastinal contours are  within normal limits. Aortic atherosclerosis. Status post median sternotomy. IMPRESSION: 1.  Minimal residual interstitial pulmonary edema again noted throughout the mid to lower lungs bilaterally. 2. Aortic atherosclerosis. Electronically Signed   By: Vinnie Langton M.D.   On: 02/19/2017 09:24     STUDIES:  CTA chest/abd/pelvis (02/10/2017): B emphysematous changes. Solitary pulmonary nodule (8 mm, lingula). No lymphadenopathy. L UVJ calculus. R bladder calculus. B renal cysts.  CULTURES: Results for orders placed or performed during the hospital encounter of 02/10/17  Urine culture     Status: None   Collection Time: 02/10/17 12:46 PM  Result Value Ref Range Status   Specimen Description URINE, CATHETERIZED  Final   Special Requests NONE  Final   Culture   Final    NO GROWTH Performed at [facility], [address], [place], [place] [address]    Report Status 02/12/2017 FINAL  Final  Blood culture (routine x 2)     Status: None   Collection Time: 02/10/17  3:25 PM  Result Value Ref Range Status   Specimen Description BLOOD RIGHT FOREARM  Final   Special Requests   Final    BOTTLES DRAWN AEROBIC AND ANAEROBIC Blood Culture adequate volume   Culture NO GROWTH 5 DAYS  Final   Report Status 02/15/2017 FINAL  Final  Blood culture (routine x 2)     Status: None   Collection Time: 02/10/17  3:37 PM  Result Value Ref Range Status   Specimen Description LEFT ANTECUBITAL  Final   Special Requests   Final    BOTTLES DRAWN AEROBIC AND ANAEROBIC Blood Culture adequate volume   Culture NO GROWTH 5 DAYS  Final   Report Status 02/15/2017 FINAL  Final  MRSA PCR Screening     Status: None   Collection Time: 02/14/17  7:49 AM  Result Value Ref Range Status   MRSA by PCR NEGATIVE NEGATIVE Final    Comment:        The GeneXpert MRSA Assay (FDA approved for NASAL specimens only), is one component of a comprehensive MRSA colonization surveillance program. It is not intended to diagnose MRSA infection nor to guide or monitor treatment for MRSA infections.      ANTIBIOTICS: -  SIGNIFICANT EVENTS: -  LINES/TUBES: -   ASSESSMENT / PLAN: Principal Problem:   Chest pain Active Problems:   Emphysema lung (HCC)   Acute respiratory failure with hypoxia (HCC)   Tobacco use disorder   Pulmonary nodule   Abdominal aortic aneurysm (HCC)   Diabetes (HCC)   HTN (hypertension)   Myocardial infarction   SOB (shortness of breath)   Troponin level elevated   Thrombocytopenia (HCC)   Normocytic anemia   Acute on chronic systolic heart failure (HCC)   Protein-calorie malnutrition, severe  Titrate oxygen to keep SpO2 89-93%. This patient needs to start back on a regimen for COPD that is easy for him to follow. While Dulera is a convenient formulation, it is less than ideal for requiring quite a bit of motor coordination (even with the use of a spacer/chamber). Aerosols would be easier. Start Brovana/Pulmicort Stop Dulera Stop Xopenex Agree with a short prednisone taper. This patient needs to get re-established with [facility]. He does have an 8 mm pulmonary nodule, which may be radiographically followed. Repeat imaging may be done in 3 months, if desired.   FAMILY  - Updates: daughter at bedside.  - Inter-disciplinary family meet or Palliative Care meeting due by:  day 7  Renee Pain, MD Board Certified by the ABIM, [place] Pager: [phone]  02/19/2017, 1:13 PM

## 2017-02-19 NOTE — Progress Notes (Signed)
Pharmacy Antibiotic Note  Dillon Sweeney is a 84 y.o. male admitted on 02/10/2017 with pneumonia.  Pharmacy has been consulted for cefepime dosing.  Plan: Cefepime 1g IV q12h Follow c/s, clinical progression, renal function  Height: 5\' 9"  (175.3 cm) Weight: 167 lb 8.8 oz (76 kg) IBW/kg (Calculated) : 70.7  Temp (24hrs), Avg:98.6 F (37 C), Min:97.8 F (36.6 C), Max:99.7 F (37.6 C)  Recent Labs  Lab 02/15/17 0711 02/16/17 0608 02/17/17 0606 02/17/17 1159 02/18/17 0338 02/19/17 0345  WBC 12.4* 11.0* 11.4* 13.3* 10.1 10.3  CREATININE 1.20 1.09 1.21  --  1.27* 1.24    Estimated Creatinine Clearance: 45.9 mL/min (by C-G formula based on SCr of 1.24 mg/dL).    Allergies  Allergen Reactions  . Morphine And Related     hallucinations    Antimicrobials this admission: Vancomycin 1/11>>1/12 Zosyn 1/11>>1/13 Cefepime 1/20 >>   Dose adjustments this admission: n/a  Microbiology results: 1/11 BCx: neg 1/11 UCx: neg  1/15 MRSA PCR: neg  Thank you for allowing pharmacy to be a part of this patient's care.  Besnik Febus D. Makyla Bye, PharmD, BCPS Clinical Pharmacist  [phone] 02/19/2017 9:48 PM

## 2017-02-19 NOTE — Progress Notes (Signed)
Pt sats in low 80's. RN placed pt back on Bipap. Pt tolerating well. Sats in the 90s. Will continued to monitor pt.

## 2017-02-19 NOTE — Progress Notes (Signed)
PROGRESS NOTE    Dillon Sweeney  BJS:[id number] DOB: 03-19-1932 DOA: 02/10/2017 PCP: Eustaquio Maize, MD   Brief Narrative:  Dillon Sweeney is a 84 y.o. man admitted on 1/11 after visiting his PCP for shortness of breath and being referred to the hospital. 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 Also has a history of AAA, diabetes, hypertension, hypothyroidism. In the ED he also complained of chest pain that he described as pressure-like radiating to both arms. In the ED he was found to meet SIRS criteria and was given broad-spectrum antibiotics. Because of elevated troponins with a high of 1.31, Cardiology was consulted overnight who recommended transfer to [facility] for further evaluation. However due to lack of bed availability patient remains in the emergencydepartment at [facility]. Admitted for Acute Respiratory Failure with Hypoxia, Acute Systolic CHF Exacerbation, and Dyspnea. Currently being diuresed and treated for Aspiration PNA and decompensated today as he had increased work of breathing. A rapid response was called and I went to the bedside to evaluate and Cardiology was already there. I spoke with Dr. Harrington Challenger who felt he decompensated because of his Heart Failure and she accepted the patient to her Service. He was placed on BiPAP and transferred to SDU. Patient was breathing a little harder today so was placed back on BiPAP. Cardiology consulting PCCM for further evaluation of Respiratory status. CT Scan done and showed possible Lung Cancer.   Assessment & Plan:   Principal Problem:   Chest pain Active Problems:   Abdominal aortic aneurysm (HCC)   Diabetes (HCC)   HTN (hypertension)   Tobacco use disorder   Myocardial infarction   Lung nodule   Shortness of breath   Troponin level elevated   Thrombocytopenia (HCC)   Normocytic anemia   Acute on chronic systolic heart failure (HCC)   Protein-calorie malnutrition,  severe  Acute Respiratory Failure with Hypoxia now requiring NIPPV with BiPAP  -Multifactorial -Patient qualifies for 3 Liters of O2 given Walk screen for Home O2 -C/w Diuresis per Cards; po Lasix changed to IV Lasix 40 mg BID and then increased to 80 mg BID -Started IV Solumedrol 60 mg q12h and will wean to 60 mg Daily and down to 40 mg IV Daily and then to po  -Patient decompensated and will be transferred to SDU on BiPAP; Intermittently on BiPAP -Patient appeared more somnolent so ABG was obtained but was not hypercarbic -Checked CT Chest and showed Enlarged cardiac silhouette. Heavy calcific atherosclerotic disease of the coronary arteries.\ Advanced emphysematous changes with large bulla formation in the posterior lower lobes. Bronchiectasis with chronic bronchitic changes.Interval development of numerous miliary soft tissue and ground-glass opacities throughout both lungs. The rapid development of these nodules suggests infectious/inflammatory causes. Interval development of small pleural effusions with lower lobe atelectasis versus confluent airspace consolidation. Dominant 10 mm lingular spiculated nodule is suspicious for malignancy  Chest Pain -Resolved.  -Concern for NSTEMI however troponin trended down. -Cardiology consulted and assessment is this is likely related to anemia and Type 2 NSTEMI.  -Recommending stress test as an outpatient.  Anemia andThrombocytopenia -Unknown Etiology. ? Lung Cancer -Patient without history of GI bleeding.  -Per chart review, patient has had chronic anemia up until 2012.  -Last hemoglobin prior to admission was about one year prior. -History of colonoscopy within a few years with unknown results per patient. S/p 1 unit of PRBC since admission. FOBT negative. -  DIC panel abnormal with elevated PT/PTT, fibrinogen and d-dimer. LDH elevated with slightly elevated total/indirect bilirubin. Unknown if this is chronic. Haptoglobin elevated. -Direct coombs  negative. Hematology recommending supportive care with Transfusions  -Hb stable at 9.4 and Platelet Count was 75 -Hematology Recc's appreciated and felt as if continuing supportive Care -Repeat CBC in AM   Suspected COPD -Likely COPD, but patient does not have official diagnosis. Quit smoking three months ago. -CT Chest showed Advanced emphysematous changes with large bulla formation in the posterior lower lobes. Bronchiectasis with chronic bronchitic changes. Interval development of numerous miliary soft tissue and ground-glass opacities throughout both lungs. The rapid development of these nodules suggests infectious/inflammatory causes. -Xopenx q 3 times daily stopped by Pulmonary -Dulera stopped by Pulmonary and patient started on Brovana/Pulmnicort  -Changed prednisone to IV Solumedrol and now will Decrease Solumedrol Dose to 40 mg IV Daily and eventually back to po Prednisone  -Mucinex  -Cardiology consulting PCCM for evaluation   Dyspnea with increased Work of Breathing  -Combination of bronchitis/COPD and pulmonary edema and Aspiration PNA.  -Cardiology starting scheduled diuresis and it was held because of AKI; Now resumed IV 80 mg Daily  -Repeat CXR this AM showed Improved ventilation with regression of bilateral pulmonary edema and pleural effusions since 02/17/2017. Underlying emphysema. -CT Chest showed hecked CT Chest and showed Enlarged cardiac silhouette. Heavy calcific atherosclerotic disease of the coronary arteries. Advanced emphysematous changes with large bulla formation in the posterior lower lobes. Bronchiectasis with chronic bronchitic changes. Interval development of numerous miliary soft tissue and ground-glass opacities throughout both lungs. The rapid development of these nodules suggests infectious/inflammatory causes. Interval development of small pleural effusions with lower lobe atelectasis versus confluent airspace consolidation. Dominant 10 mm lingular spiculated  nodule is suspicious for malignancy -Will start Broad Spectrum Abx again with Cefepime based on rapid development of Lung nodules  -Cardiology consulting PCCM for further evaluation   Suspected Lung Cancer -CT Chest showed Dominant 10 mm lingular spiculated nodule is suspicious for malignancy. -Will defer to Pulmonary to further work up -Will notify Palliative Care and ask for Consultation   Acute Systolic Heart Failure -Unsure if this is chronic. EF of 35-40% with diffuse hypokinesis on echocardiogram from 1/14.  -Patient presented with concern for Nstemi. -Cardiology recommendations appreciated -Cardiology changed patient to Metoprolol Succinate 100 mg po Daily  -Cardiology continuing to diurese with IV 80 mg BID  -Strict I's/O's, Daily Weights, and SLIV -Patient is -7.467 mL and Weight is down -13 Lbs but ? Accuracy -Per Cardiology may be a candidate for Entresto eventually and may add Metolazone   Acute Kidney Injury, improved  -Likely secondary to diuresis. -Recommendeed to hold lasix initially  but will now resume at 40 mg po BID now that Cr is improved; Cardiology changed to IV 40 mg BID to IV 80 mg today  -Cr stable around 1.24 -Repeat CMP in AM   Paroxysmal Atrial Fibrillation but likely was Sinus Rhythm -Likely rebound from decrease in metoprolol. EKG suggested Atrial fibrillation but Cardiology feels it was Sinus Rhtyhm with PAC's. Rate better controlled and was in Sinus toady -Continue Metoprolol but changed to Metoprolol Succinate by Cardiology  -Cardiology recommendations appreciated. Per Cards patient is not a candidate for Anticoagulation given recent Problems with GIB   SIRS from PNA, worsened  -Criteria met on admission. No source initially but now with evidence of aspiration pneumonia and was treated with Augmentin  -Given empiric antibiotics. Blood culture with no growth x5 days.  -Urine  culture without growth. Chest x-ray suggesting pneumonia. -D/C'd  Augmentin as completed 7 days of Abx. -CT Chest Done and showed ? Infectious/Inflammatory Lung Nodules; Will start IV Cefepime  -SLP Recommending Regular Diet with Thin Liquids   Aspiration Pneumonia -Suggested on Chest x-ray earlier on Admission. -D/C'd Augmentin -C/w Mucinex and Benzonatate -As above per Pulmonary Xopenex 0.63 mg Neb TID and Dulera -Started IV Solumedrol 60 mg q12h and changed to IV Solumedrol 60 mg Daily and now decreased to 40 mg IV Daily and now on Po -Repeat CXR 1/19 AM showed Improved ventilation with regression of bilateral pulmonary edema and pleural effusions since 02/17/2017.  Underlying emphysema -CT Chest done and showed findings above -Start Broad Spectrum Abx with Cefepime  Lung Nodule -CT Chest showed Interval development of numerous miliary soft tissue and ground-glass opacities throughout both lungs. The rapid development of these nodules suggests infectious/inflammatory causes. Interval development of small pleural effusions with lower lobe atelectasis versus confluent airspace consolidation. Dominant 10 mm lingular spiculated nodule is suspicious for malignancy  Diabetes Mellitus -C/w Sensitive Novolog SSI AC -CBG's ranging from 140-252  Delirium, improved  -Appears to have resolved. Unknown etiology. Patient has underlying cognitive impairment per colleague discussion with wife.  -CT head negative for hematoma.  -Delirium Precautions  -Appears to have resolved.  Hypothyroidism -Low TSH. -Decreased Synthroid dose to 137 mcg daily  Severe Malnutrition in the Context of Chronic illness -Nutritionist consulted and appreciate Recc's -C/w Ensure Enlive po daily and 30 ml Prostat BID -C/w MVI daily   Leukocytosis, improved  -Was intially though to be from IV Steroid Demargination -Continue to Monitor for S/Sx of Infection.  -Patient was Augmentin for Aspiration and completed course but will put on Cefepime given CT Chest Findings.  -Repeat  CBC in AM  Hypokalemia -K+ is 3.1 -Replete -Repeat CMP in AM  DVT prophylaxis: SCDs given recent Anemia and Hx of GIB Code Status: FULL CODE Family Communication: Discussed with wife at bedside Disposition Plan: Transferred to SDU to Cardiology [place] [facility] when medically stable at D/C  Consultants:   Cardiology now Primary   [facility]  Hematology/Oncology  Pulmonary  Palliative Care   Procedures:  ECHOCARDIOGRAM ------------------------------------------------------------------- Study Conclusions  - Left ventricle: The cavity size was normal. Wall thickness was   normal. Systolic function was moderately reduced. The estimated   ejection fraction was in the range of 35% to 40%. Diffuse   hypokinesis. - Mitral valve: There was mild to moderate regurgitation directed   centrally. - Left atrium: The atrium was mildly dilated. - Right ventricle: The cavity size was mildly dilated. Systolic   function was moderately reduced. - Right atrium: The atrium was mildly dilated. - Tricuspid valve: There was moderate regurgitation. - Pulmonary arteries: Systolic pressure was mildly increased. PA   peak pressure: 40 mm Hg (S).  Antimicrobials:  Anti-infectives (From admission, onward)   Start     Dose/Rate Route Frequency Ordered Stop   02/14/17 1000  amoxicillin-clavulanate (AUGMENTIN) 875-125 MG per tablet 1 tablet  Status:  Discontinued     1 tablet Oral Every 12 hours 02/14/17 0716 02/18/17 2046   02/11/17 2200  piperacillin-tazobactam (ZOSYN) IVPB 3.375 g  Status:  Discontinued     3.375 g 12.5 mL/hr over 240 Minutes Intravenous Every 8 hours 02/11/17 1633 02/12/17 1022   02/11/17 0800  vancomycin (VANCOCIN) IVPB 750 mg/150 ml premix  Status:  Discontinued     750 mg 150 mL/hr over 60 Minutes Intravenous Every 12 hours  02/10/17 2035 02/11/17 1436   02/10/17 2200  piperacillin-tazobactam (ZOSYN) IVPB 3.375 g  Status:  Discontinued     3.375 g 12.5 mL/hr over 240  Minutes Intravenous Every 8 hours 02/10/17 2031 02/11/17 1436   02/10/17 1900  vancomycin (VANCOCIN) 1,500 mg in sodium chloride 0.9 % 500 mL IVPB     1,500 mg 250 mL/hr over 120 Minutes Intravenous  Once 02/10/17 1834 02/10/17 2120   02/10/17 1445  cefTRIAXone (ROCEPHIN) 1 g in dextrose 5 % 50 mL IVPB  Status:  Discontinued     1 g 100 mL/hr over 30 Minutes Intravenous  Once 02/10/17 1441 02/10/17 1444   02/10/17 1445  piperacillin-tazobactam (ZOSYN) IVPB 3.375 g     3.375 g 100 mL/hr over 30 Minutes Intravenous  Once 02/10/17 1444 02/10/17 1553     Subjective: Seen and examined at and felt worn and tired. Denied any CP but still felt SOB. No nausea or vomiting.   Objective: Vitals:   02/18/17 1947 02/18/17 2344 02/18/17 2358 02/19/17 0320  BP:  126/62  98/64  Pulse: 76 73 95 75  Resp: (!) 27 (!) 21 (!) 27 (!) 25  Temp:  99.4 F (37.4 C)  97.8 F (36.6 C)  TempSrc:  Axillary  Oral  SpO2: 95% 98% 96% 93%  Weight:      Height:        Intake/Output Summary (Last 24 hours) at 02/19/2017 0744 Last data filed at 02/19/2017 0151 Gross per 24 hour  Intake 220 ml  Output 2550 ml  Net -2330 ml   Filed Weights   02/15/17 0431 02/16/17 0501 02/17/17 0434  Weight: 84.2 kg (185 lb 10 oz) 77.2 kg (170 lb 1.6 oz) 76 kg (167 lb 8.8 oz)   Examination: Physical Exam:  Constitutional: WN/WD ill appearing Caucasian Male who appears fatigued and not slightly labored breathing  Eyes: Sclerae anicteric, Lids Normal ENMT: External Ears and nose appear normal. MMM Neck: Supple with no JVD Respiratory: Diminished bilaterally with coarse breath sounds and crackles. Had some labored breathing and mild tachypenia Cardiovascular: Tachycardic but RRR. Minimal edema Abdomen: Soft, NT, ND. Bowel sounds present GU: Deferred.  Musculoskeletal: No contractures; No cyanosis Skin: Warm and Dry; No appreciable rashes or lesions on a limited skin eval Neurologic: CN 2-12 grossly intact. No appreciable  focal deficits Psychiatric: Anxious and fatigued appearing. Normal mood but flat affect. Intact judgement and insight  Data Reviewed: I have personally reviewed following labs and imaging studies  CBC: Recent Labs  Lab 02/16/17 0608 02/17/17 0606 02/17/17 1159 02/18/17 0338 02/19/17 0345  WBC 11.0* 11.4* 13.3* 10.1 10.3  NEUTROABS 9.4* 10.5*  --  8.8* 9.7*  HGB 8.4* 8.7* 9.0* 9.0* 9.4*  HCT 26.0* 26.4* 27.5* 27.7* 28.8*  MCV 95.9 96.0 96.2 96.5 95.4  PLT 62* 62* 59* 57* 54*   Basic Metabolic Panel: Recent Labs  Lab 02/14/17 0607 02/15/17 0711 02/16/17 0608 02/17/17 0606 02/18/17 0338 02/19/17 0345  NA 136 137 137 139 137 135  K 2.9* 4.1 3.5 3.3* 3.2* 3.1*  CL 99* 101 99* 94* 90* 88*  CO2 _0 32 33* 33*  GLUCOSE 106* 141* 184* 131* 156* 146*  BUN 25* 26* 27* 23* 28* 33*  CREATININE 1.46* 1.20 1.09 1.21 1.27* 1.24  CALCIUM 8.7* 8.7* 9.2 8.9 8.9 9.0  MG 2.1  --  2.2 2.1 2.0 1.9  PHOS  --   --  3.2 3.1 3.1 2.9   GFR: Estimated Creatinine Clearance:  45.9 mL/min (by C-G formula based on SCr of 1.24 mg/dL). Liver Function Tests: Recent Labs  Lab 02/12/17 1158 02/16/17 0608 02/17/17 0606 02/18/17 0338 02/19/17 0345  AST 34 _0 ALT 15* _1 ALKPHOS 53 74 76 80 83  BILITOT 1.4* 1.0 1.1 1.3* 1.3*  PROT 6.0* 6.7 6.3* 6.9 7.1  ALBUMIN 2.7* 2.8* 2.6* 2.7* 2.7*   No results for input(s): LIPASE, AMYLASE in the last 168 hours. No results for input(s): AMMONIA in the last 168 hours. Coagulation Profile: Recent Labs  Lab 02/12/17 1158  INR 1.31   Cardiac Enzymes: No results for input(s): CKTOTAL, CKMB, CKMBINDEX, TROPONINI in the last 168 hours. BNP (last 3 results) No results for input(s): PROBNP in the last 8760 hours. HbA1C: No results for input(s): HGBA1C in the last 72 hours. CBG: Recent Labs  Lab 02/17/17 2114 02/18/17 0805 02/18/17 1155 02/18/17 1629 02/18/17 2134  GLUCAP 164* 124* 161* 265* 216*   Lipid Profile: No results  for input(s): CHOL, HDL, LDLCALC, TRIG, CHOLHDL, LDLDIRECT in the last 72 hours. Thyroid Function Tests: No results for input(s): TSH, T4TOTAL, FREET4, T3FREE, THYROIDAB in the last 72 hours. Anemia Panel: No results for input(s): VITAMINB12, FOLATE, FERRITIN, TIBC, IRON, RETICCTPCT in the last 72 hours. Sepsis Labs: No results for input(s): PROCALCITON, LATICACIDVEN in the last 168 hours.  Recent Results (from the past 240 hour(s))  Urine culture     Status: None   Collection Time: 02/10/17 12:46 PM  Result Value Ref Range Status   Specimen Description URINE, CATHETERIZED  Final   Special Requests NONE  Final   Culture   Final    NO GROWTH Performed at [facility], [address] [place], [place] [address]    Report Status 02/12/2017 FINAL  Final  Blood culture (routine x 2)     Status: None   Collection Time: 02/10/17  3:25 PM  Result Value Ref Range Status   Specimen Description BLOOD RIGHT FOREARM  Final   Special Requests   Final    BOTTLES DRAWN AEROBIC AND ANAEROBIC Blood Culture adequate volume   Culture NO GROWTH 5 DAYS  Final   Report Status 02/15/2017 FINAL  Final  Blood culture (routine x 2)     Status: None   Collection Time: 02/10/17  3:37 PM  Result Value Ref Range Status   Specimen Description LEFT ANTECUBITAL  Final   Special Requests   Final    BOTTLES DRAWN AEROBIC AND ANAEROBIC Blood Culture adequate volume   Culture NO GROWTH 5 DAYS  Final   Report Status 02/15/2017 FINAL  Final  MRSA PCR Screening     Status: None   Collection Time: 02/14/17  7:49 AM  Result Value Ref Range Status   MRSA by PCR NEGATIVE NEGATIVE Final    Comment:        The GeneXpert MRSA Assay (FDA approved for NASAL specimens only), is one component of a comprehensive MRSA colonization surveillance program. It is not intended to diagnose MRSA infection nor to guide or monitor treatment for MRSA infections.     Radiology Studies: Dg Chest Port 1 View  Result Date:  02/18/2017 CLINICAL DATA:  84 year old male admitted with chest pain and shortness of breath. Acute on chronic systolic heart failure, NSTEMI. EXAM: PORTABLE CHEST 1 VIEW COMPARISON:  02/17/2017 and earlier, including chest and abdomen CTA 02/10/2017 FINDINGS: Portable AP semi upright view at 0642 hours. Mediastinal contours are stable and  normal aside from mild tortuosity of the thoracic aorta. Prior CABG Visualized tracheal air column is within normal limits. Regressed bilateral pulmonary interstitial opacity and bilateral pleural effusions with improved lung base ventilation. Superimposed attenuation of perihilar and right upper lobe bronchovascular markings corresponding to emphysema seen on recent chest CTA. No new pulmonary opacity.  No pneumothorax. IMPRESSION: 1. Improved ventilation with regression of bilateral pulmonary edema and pleural effusions since 02/17/2017. 2. Underlying emphysema. Electronically Signed   By: Genevie Ann M.D.   On: 02/18/2017 07:47   Dg Chest Port 1 View  Result Date: 02/17/2017 CLINICAL DATA:  Congestive heart failure with worsened shortness of breath. EXAM: PORTABLE CHEST 1 VIEW COMPARISON:  02/16/2017.  02/13/2017. FINDINGS: Persistent interstitial edema in the lower lungs with small effusions and basilar volume loss. Upper lungs largely clear. Similar appearance to the previous exam. This may be slightly worsened radiographically. IMPRESSION: Similar appearance with interstitial edema and volume loss in the lower lungs. Possibly slightly worsened. Electronically Signed   By: Nelson Chimes M.D.   On: 02/17/2017 10:51   Scheduled Meds: . carvedilol  6.25 mg Oral BID WC  . furosemide  80 mg Intravenous BID  . guaiFENesin  1,200 mg Oral BID  . insulin aspart  0-9 Units Subcutaneous TID WC  . levalbuterol  0.63 mg Nebulization TID  . levothyroxine  137 mcg Oral QAC breakfast  . methylPREDNISolone (SOLU-MEDROL) injection  40 mg Intravenous Daily  . mometasone-formoterol  2  puff Inhalation BID  . pantoprazole  40 mg Oral Daily  . potassium chloride  40 mEq Oral BID  . rosuvastatin  40 mg Oral q1800   Continuous Infusions: . sodium chloride      LOS: 9 days   Kerney Elbe, DO [facility] Pager [phone]  If 7PM-7AM, please contact night-coverage [email or web address] Password [facility] 02/19/2017, 7:44 AM

## 2017-02-19 NOTE — Progress Notes (Signed)
Pt took BiPAP off. Didn't want it on. RN educate pt the need for BiPAP. Placed pt back on HFNC 8L. sats 90s. Will continued to monitor pt.

## 2017-02-19 NOTE — Progress Notes (Signed)
PCCM consult called per Dr. Kyla Balzarine request. Melina Copa PA-C

## 2017-02-20 ENCOUNTER — Inpatient Hospital Stay (HOSPITAL_COMMUNITY): Payer: Medicare Other

## 2017-02-20 DIAGNOSIS — R0600 Dyspnea, unspecified: Secondary | ICD-10-CM

## 2017-02-20 DIAGNOSIS — J810 Acute pulmonary edema: Secondary | ICD-10-CM

## 2017-02-20 DIAGNOSIS — Z66 Do not resuscitate: Secondary | ICD-10-CM

## 2017-02-20 DIAGNOSIS — Z515 Encounter for palliative care: Secondary | ICD-10-CM

## 2017-02-20 DIAGNOSIS — J9601 Acute respiratory failure with hypoxia: Secondary | ICD-10-CM

## 2017-02-20 LAB — COMPREHENSIVE METABOLIC PANEL
ALK PHOS: 94 U/L (ref 38–126)
ALT: 23 U/L (ref 17–63)
AST: 40 U/L (ref 15–41)
Albumin: 2.8 g/dL — ABNORMAL LOW (ref 3.5–5.0)
Anion gap: 18 — ABNORMAL HIGH (ref 5–15)
BILIRUBIN TOTAL: 1.3 mg/dL — AB (ref 0.3–1.2)
BUN: 43 mg/dL — ABNORMAL HIGH (ref 6–20)
CALCIUM: 9.3 mg/dL (ref 8.9–10.3)
CO2: 33 mmol/L — ABNORMAL HIGH (ref 22–32)
CREATININE: 1.5 mg/dL — AB (ref 0.61–1.24)
Chloride: 86 mmol/L — ABNORMAL LOW (ref 101–111)
GFR, EST AFRICAN AMERICAN: 48 mL/min — AB (ref >60)
GFR, EST NON AFRICAN AMERICAN: 42 mL/min — AB (ref >60)
Glucose, Bld: 168 mg/dL — ABNORMAL HIGH (ref 65–99)
Potassium: 3.6 mmol/L (ref 3.5–5.1)
Sodium: 137 mmol/L (ref 135–145)
Total Protein: 7.4 g/dL (ref 6.5–8.1)

## 2017-02-20 LAB — CBC WITH DIFFERENTIAL/PLATELET
BASOS PCT: 1 %
Basophils Absolute: 0.1 10*3/uL (ref 0.0–0.1)
EOS PCT: 0 %
Eosinophils Absolute: 0 10*3/uL (ref 0.0–0.7)
HCT: 32.9 % — ABNORMAL LOW (ref 39.0–52.0)
HEMOGLOBIN: 10.3 g/dL — AB (ref 13.0–17.0)
Lymphocytes Relative: 7 %
Lymphs Abs: 0.6 10*3/uL — ABNORMAL LOW (ref 0.7–4.0)
MCH: 29.9 pg (ref 26.0–34.0)
MCHC: 31.3 g/dL (ref 30.0–36.0)
MCV: 95.4 fL (ref 78.0–100.0)
MONO ABS: 0.5 10*3/uL (ref 0.1–1.0)
Monocytes Relative: 6 %
NEUTROS PCT: 86 %
Neutro Abs: 7.8 10*3/uL — ABNORMAL HIGH (ref 1.7–7.7)
Platelets: 61 10*3/uL — ABNORMAL LOW (ref 150–400)
RBC: 3.45 MIL/uL — ABNORMAL LOW (ref 4.22–5.81)
RDW: 21.8 % — ABNORMAL HIGH (ref 11.5–15.5)
WBC: 9 10*3/uL (ref 4.0–10.5)

## 2017-02-20 LAB — GLUCOSE, CAPILLARY
Glucose-Capillary: 185 mg/dL — ABNORMAL HIGH (ref 65–99)
Glucose-Capillary: 169 mg/dL — ABNORMAL HIGH (ref 65–99)

## 2017-02-20 LAB — PHOSPHORUS: PHOSPHORUS: 3.4 mg/dL (ref 2.5–4.6)

## 2017-02-20 LAB — MAGNESIUM: Magnesium: 2 mg/dL (ref 1.7–2.4)

## 2017-02-20 LAB — PROCALCITONIN: Procalcitonin: 1.26 ng/mL

## 2017-02-20 LAB — PATHOLOGIST SMEAR REVIEW

## 2017-02-20 MED ORDER — FUROSEMIDE 10 MG/ML IJ SOLN
60.0000 mg | Freq: Once | INTRAMUSCULAR | Status: AC
  Administered 2017-02-20: 60 mg via INTRAVENOUS
  Filled 2017-02-20: qty 6

## 2017-02-20 MED ORDER — METOPROLOL TARTRATE 5 MG/5ML IV SOLN
2.5000 mg | Freq: Once | INTRAVENOUS | Status: AC
  Administered 2017-02-20: 2.5 mg via INTRAVENOUS
  Filled 2017-02-20: qty 5

## 2017-02-20 MED ORDER — LORAZEPAM 2 MG/ML IJ SOLN
2.0000 mg | INTRAMUSCULAR | Status: DC | PRN
Start: 2017-02-20 — End: 2017-02-21

## 2017-02-20 MED ORDER — VANCOMYCIN HCL IN DEXTROSE 1-5 GM/200ML-% IV SOLN: 1000.0000 mg | INTRAVENOUS | Status: DC

## 2017-02-20 MED ORDER — FENTANYL CITRATE (PF) 100 MCG/2ML IJ SOLN
50.0000 ug | INTRAMUSCULAR | Status: DC | PRN
  Administered 2017-02-20: 50 ug via INTRAVENOUS
  Filled 2017-02-20: qty 2

## 2017-02-20 MED ORDER — VANCOMYCIN HCL 10 G IV SOLR
1500.0000 mg | Freq: Once | INTRAVENOUS | Status: AC
  Administered 2017-02-20: 1500 mg via INTRAVENOUS
  Filled 2017-02-20: qty 1500

## 2017-02-20 MED ORDER — LORAZEPAM 2 MG/ML IJ SOLN
0.5000 mg | Freq: Once | INTRAMUSCULAR | Status: AC
  Administered 2017-02-20: 0.5 mg via INTRAVENOUS
  Filled 2017-02-20: qty 1

## 2017-02-20 MED ORDER — IPRATROPIUM-ALBUTEROL 0.5-2.5 (3) MG/3ML IN SOLN
3.0000 mL | Freq: Once | RESPIRATORY_TRACT | Status: AC
  Administered 2017-02-20: 3 mL via RESPIRATORY_TRACT
  Filled 2017-02-20: qty 3

## 2017-02-20 MED ORDER — ENSURE ENLIVE PO LIQD: 237.0000 mL | Freq: Two times a day (BID) | ORAL | Status: DC

## 2017-02-20 NOTE — Progress Notes (Signed)
PT Cancellation Note  Patient Details Name: Dillon Sweeney MRN: [id number] DOB: July 26, 1932   Cancelled Treatment:    Reason Eval/Treat Not Completed: Patient not medically ready. On BiPAP. Will follow-up for PT treatment tomorrow when appropriate.  Mabeline Caras, PT, DPT Acute Rehab Services  Pager: [phone] 02/20/2017, 10:36 AM

## 2017-02-20 NOTE — Progress Notes (Signed)
Pharmacy Antibiotic Note  Dillon Sweeney is a 84 y.o. male admitted on 02/10/2017 with pneumonia.    Patient started on cefepime yesterday, will add vancomycin today. Tmax 100.6, wbc normal at 9.   Goal vancomycin trough 15-20  Plan: Vancomycin 1500mg  x1 then 1g q24 hours Continue Cefepime 1g IV q12h Follow c/s, clinical progression, renal function  Height: 5\' 9"  (175.3 cm) Weight: 167 lb 8.8 oz (76 kg) IBW/kg (Calculated) : 70.7  Temp (24hrs), Avg:99.2 F (37.3 C), Min:98 F (36.7 C), Max:100.6 F (38.1 C)  Recent Labs  Lab 02/16/17 0608 02/17/17 0606 02/17/17 1159 02/18/17 0338 02/19/17 0345 02/20/17 0239  WBC 11.0* 11.4* 13.3* 10.1 10.3 9.0  CREATININE 1.09 1.21  --  1.27* 1.24 1.50*    Estimated Creatinine Clearance: 38 mL/min (A) (by C-G formula based on SCr of 1.5 mg/dL (H)).    Allergies  Allergen Reactions  . Morphine And Related     hallucinations    Antimicrobials this admission: Vancomycin 1/11>>1/12 Zosyn 1/11>>1/13 Cefepime 1/20 >>  Vanc 1/21>>  Dose adjustments this admission: n/a  Microbiology results: 1/11 BCx: neg 1/11 UCx: neg  1/15 MRSA PCR: neg  Thank you for allowing pharmacy to be a part of this patient's care.  Erin Hearing PharmD., BCPS Clinical Pharmacist 02/20/2017 11:04 AM

## 2017-02-20 NOTE — Care Management Note (Addendum)
Case Management Note  Patient Details  Name: Dillon Sweeney MRN: [id number] Date of Birth: [birth year]  Subjective/Objective:    Pt admitted with Nonstemi - stay complicated by resp distress - pt is now requiring BIPAP                Action/Plan:  PTA independent from home with spouse.  Palliative consulted for possible lung CA.  CM will continue to follow for discharge needs    Expected Discharge Date:                  Expected Discharge Plan:     In-House Referral:     Discharge planning Services  CM Consult  Post Acute Care Choice:    Choice offered to:     DME Arranged:    DME Agency:     HH Arranged:    HH Agency:     Status of Service:     If discussed at [employer], dates discussed:    Additional Comments:  Maryclare Labrador, RN 02/20/2017, 3:47 PM

## 2017-02-20 NOTE — Progress Notes (Signed)
Pt took BiPAP off, stated he wanted a break from it. Placed pt back on HFNC 6L. RR 30-35, increase work of breathing. Stated he's very tired. Notified on call NP. Will continue to monitor pt.

## 2017-02-20 NOTE — Progress Notes (Signed)
PROGRESS NOTE    Dillon Sweeney  GGE:[id number] DOB: 1933/01/25 DOA: 02/10/2017 PCP: Eustaquio Maize, MD   Brief Narrative:  Dillon Sweeney is a 84 y.o. man admitted on 1/11 after visiting his PCP for shortness of breath and being referred to the hospital. 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 Also has a history of AAA, diabetes, hypertension, hypothyroidism. In the ED he also complained of chest pain that he described as pressure-like radiating to both arms. In the ED he was found to meet SIRS criteria and was given broad-spectrum antibiotics. Because of elevated troponins with a high of 1.31, Cardiology was consulted overnight who recommended transfer to [facility] for further evaluation. However due to lack of bed availability patient remains in the emergencydepartment at [facility]. Admitted for Acute Respiratory Failure with Hypoxia, Acute Systolic CHF Exacerbation, and Dyspnea. Currently being diuresed and treated for Aspiration PNA and decompensated today as he had increased work of breathing. A rapid response was called and I went to the bedside to evaluate and Cardiology was already there. I spoke with Dr. Harrington Challenger who felt he decompensated because of his Heart Failure and she accepted the patient to her Service. He was placed on BiPAP and transferred to SDU. Patient was breathing a little harder today so was placed back on BiPAP. Cardiology consulting PCCM for further evaluation of Respiratory status. CT Scan done and showed possible Lung Cancer. I took over back from Cardiology this AM as he was seen and examined this AM and had been deteriorating more. Palliative met with the family and after a Goals of Care discussion they are focusing more toward comfort and there will be no escalation of Care. Patient made DNR and if he survives the night the plan is to [facility] in AM.    Assessment & Plan:   Principal  Problem:   Chest pain Active Problems:   Abdominal aortic aneurysm (HCC)   Diabetes ([place])   HTN (hypertension)   Tobacco use disorder   Myocardial infarction   Pulmonary nodule   SOB (shortness of breath)   Troponin level elevated   Thrombocytopenia (HCC)   Normocytic anemia   Acute on chronic systolic heart failure (HCC)   Protein-calorie malnutrition, severe   Emphysema lung (HCC)   Acute respiratory failure with hypoxia (HCC)  Acute Respiratory Failure with Hypoxia now requiring NIPPV with BiPAP; BiPAP to be removed given transition to [place]  -Multifactorial -Patient qualified for 3 Liters of O2 given Walk screen for Home O2 but will be placed on 4 Liters of O2 for Comfort  -C/w Diuresis per Cards; po Lasix changed to IV Lasix 40 mg BID and then increased to 80 mg BID and will continue tonight  -Started IV Solumedrol 60 mg q12h and now weaned to po 40 mg daily  -Patient decompensated andwas transferred to SDU on BiPAP; Intermittently on BiPAP but will now stop BiPAP as patient continues to deteriorate and will be transitioning to [place] CT Chest and showed Enlarged cardiac silhouette. Heavy calcific atherosclerotic disease of the coronary arteries.\ Advanced emphysematous changes with large bulla formation in the posterior lower lobes. Bronchiectasis with chronic bronchitic changes.Interval development of numerous miliary soft tissue and ground-glass opacities throughout both lungs. The rapid development of these nodules suggests infectious/inflammatory causes. Interval development of small pleural effusions with lower lobe atelectasis versus confluent airspace consolidation. Dominant 10  mm lingular spiculated nodule is suspicious for malignancy -Palliative Met with Family for [place] discussion as patient was worsening. Plan is to transition to comfort care but will not escalate care. Will continue Nebs, Abx, and Lasix for now -Palliative adding IV Fentanyl for  Comfort, and will continue Lorazepam   Chest Pain -Resolved.  -Concern for NSTEMI however troponin trended down. -Cardiology consulted and assessment is this is likely related to anemia and Type 2 NSTEMI.  -Recommending stress test as an outpatient but will not pursue as Goals of Care will be to transition more towaard Comfort   Anemia andThrombocytopenia -Unknown Etiology. ? Lung Cancer -Patient without history of GI bleeding.  -Per chart review, patient has had chronic anemia up until 2012.  -Last hemoglobin prior to admission was about one year prior. -History of colonoscopy within a few years with unknown results per patient. S/p 1 unit of PRBC since admission. FOBT negative. - DIC panel abnormal with elevated PT/PTT, fibrinogen and d-dimer. LDH elevated with slightly elevated total/indirect bilirubin. Unknown if this is chronic. Haptoglobin elevated. -Direct coombs negative. Hematology recommending supportive care with Transfusions  -Hb stable at 10.3 and Platelet Count was 42 -Hematology Recc's appreciated and felt as if continuing supportive Care -Will not repeat CBC in AM as patient transitioning more toward comfort   Suspected COPD -Likely COPD, but patient does not have official diagnosis. Quit smoking three months ago. -CT Chest showed Advanced emphysematous changes with large bulla formation in the posterior lower lobes. Bronchiectasis with chronic bronchitic changes. Interval development of numerous miliary soft tissue and ground-glass opacities throughout both lungs. The rapid development of these nodules suggests infectious/inflammatory causes. -Xopenx q 3 times daily stopped by Pulmonary -Dulera stopped by Pulmonary and patient started on Brovana/Pulmnicort and will continue  -Changed prednisone to IV Solumedrol and now will Decrease Solumedrol Dose to 40 mg IV Daily and eventually back to po Prednisone  -Mucinex  -Cardiology consulting PCCM for evaluation  -PCCM  recommending not Intubation as patient will likely never come off vent given advanced lung disease  Dyspnea with increased Work of Breathing  -Combination of bronchitis/COPD and pulmonary edema and Aspiration PNA.  -Cardiology starting scheduled diuresis and it was held because of AKI; Now resumed IV 80 mg Daily  -Repeat CXR this AM showed Improved ventilation with regression of bilateral pulmonary edema and pleural effusions since 02/17/2017. Underlying emphysema. -CT Chest showed hecked CT Chest and showed Enlarged cardiac silhouette. Heavy calcific atherosclerotic disease of the coronary arteries. Advanced emphysematous changes with large bulla formation in the posterior lower lobes. Bronchiectasis with chronic bronchitic changes. Interval development of numerous miliary soft tissue and ground-glass opacities throughout both lungs. The rapid development of these nodules suggests infectious/inflammatory causes. Interval development of small pleural effusions with lower lobe atelectasis versus confluent airspace consolidation. Dominant 10 mm lingular spiculated nodule is suspicious for malignancy -Will start Broad Spectrum Abx again with Cefepime based on rapid development of Lung nodules  -Cardiology consulted PCCM for further evaluation; Will not Escalate Care and will remove BiPAP and shift focus toward comfort given deterioration and worsening   Suspected Lung Cancer -CT Chest showed Dominant 10 mm lingular spiculated nodule is suspicious for malignancy. -Will now further workup now that shift is focusing toward comfort -Palliative Care consulted for further reccs; Focus is shifting toward comfort   Acute Systolic Heart Failure -Unsure if this is chronic. EF of 35-40% with diffuse hypokinesis on echocardiogram from 1/14.  -Patient presented with concern for Nstemi. -Cardiology recommendations  appreciated -Cardiology changed patient to Metoprolol Succinate 100 mg po Daily  -Cardiology  continuing to diurese with IV 80 mg BID; Got an additional 60 mg IV Lasix  -Strict I's/O's, Daily Weights, and SLIV -Patient is -7.667 mL and Weight is down -13 Lbs but ? Accuracy -Goal is to shift more toward palliation   Acute Kidney Injury -Likely secondary to diuresis. -Recommendeed to hold lasix initially  but will now resume at 40 mg po BID now that Cr is improved; Cardiology changed to IV 40 mg BID to IV 80 mg and gave an additional IV 60 mg Lasix -Cr worsening  -Will not repeat CMP in AM given transitioning toward Comfort care  Paroxysmal Atrial Fibrillation but likely was Sinus Rhythm -Likely rebound from decrease in metoprolol. EKG suggested Atrial fibrillation but Cardiology feels it was Sinus Rhtyhm with PAC's. Rate better controlled and was in Sinus toady -Continue Metoprolol but changed to Metoprolol Succinate by Cardiology  -Cardiology recommendations appreciated. Per Cards patient is not a candidate for Anticoagulation given recent Problems with GIB   SIRS from PNA, worsened  -Criteria met on admission. No source initially but now with evidence of aspiration pneumonia and was treated with Augmentin  -Given empiric antibiotics. Blood culture with no growth x5 days.  -Urine culture without growth. Chest x-ray suggesting pneumonia. -D/C'd Augmentin as completed 7 days of Abx. -CT Chest Done and showed ? Infectious/Inflammatory Lung Nodules; Will start IV Cefepime; Pulmonary added IV Vancomycin  -SLP Recommending Regular Diet with Thin Liquids but will transition patient toward [place] given Palliative Discussion with Family   Aspiration Pneumonia/HCAP -Suggested on Chest x-ray earlier on Admission.  -D/C'd Augmentin as completed Course but added IV Cefepime and Vancomycin  -C/w Mucinex and Benzonatate -As above per Pulmonary Xopenex 0.63 mg Neb TID and Dulera -Started IV Solumedrol 60 mg q12h and changed to IV Solumedrol 60 mg Daily and now decreased to 40 mg IV  Daily and now on Po -Repeat CXR 1/19 AM showed Improved ventilation with regression of bilateral pulmonary edema and pleural effusions since 02/17/2017.  Underlying emphysema -CT Chest done and showed findings above -Start Broad Spectrum Abx with Cefepime yesterday; Pulmonary added Vancomycin -Procalcitonin was 1.26 -Will not escalate care; BiPAP discontinued and will place on 4 Liters of O2 for comfort   Lung Nodule -CT Chest showed Interval development of numerous miliary soft tissue and ground-glass opacities throughout both lungs. The rapid development of these nodules suggests infectious/inflammatory causes. Interval development of small pleural effusions with lower lobe atelectasis versus confluent airspace consolidation. Dominant 10 mm lingular spiculated nodule is suspicious for malignancy -Will not pursue workup as goals of care are shifting toward comfort.   Diabetes Mellitus -C/w Sensitive Novolog SSI AC -CBG's ranging from 169-252; Will not do any more CBG checks as patient is being transitioned toward comfort care.   Delirium, improved but now Patient Somnolent  -Appears to have resolved. Unknown etiology. Patient has underlying cognitive impairment per colleague discussion with wife.  -CT head negative for hematoma.  -Delirium Precautions  -Appears to have resolved.  Hypothyroidism -Low TSH. -Decreased Synthroid dose to 137 mcg daily  Severe Malnutrition in the Context of Chronic illness -Nutritionist consulted and appreciate Recc's -C/w Ensure Enlive po daily and 30 ml Prostat BID -C/w MVI daily   Leukocytosis, improved  -Was intially though to be from IV Steroid Demargination -Continue to Monitor for S/Sx of Infection.  -Patient was Augmentin for Aspiration and completed course but will put on Cefepime  and Vancomycin given CT Chest Findings.  -Will not repeat blood work in AM as goals are changing toward [place]   Hypokalemia -K+ was 3.6  -Replete  yesterday -Will not check blood work in AM as goals are changing toward [place]  DVT prophylaxis: SCDs given recent Anemia and Hx of GIB Code Status: DO NOT RESUSCITATE  Family Communication: Discussed with wife at bedside Disposition Plan: [place] in AM if patient survives the night.   Consultants:   Cardiology   Hematology/Oncology  Pulmonary  Palliative Care   Procedures:  ECHOCARDIOGRAM ------------------------------------------------------------------- Study Conclusions  - Left ventricle: The cavity size was normal. Wall thickness was   normal. Systolic function was moderately reduced. The estimated   ejection fraction was in the range of 35% to 40%. Diffuse   hypokinesis. - Mitral valve: There was mild to moderate regurgitation directed   centrally. - Left atrium: The atrium was mildly dilated. - Right ventricle: The cavity size was mildly dilated. Systolic   function was moderately reduced. - Right atrium: The atrium was mildly dilated. - Tricuspid valve: There was moderate regurgitation. - Pulmonary arteries: Systolic pressure was mildly increased. PA   peak pressure: 40 mm Hg (S).  Antimicrobials:  Anti-infectives (From admission, onward)   Start     Dose/Rate Route Frequency Ordered Stop   02/21/17 1800  vancomycin (VANCOCIN) IVPB 1000 mg/200 mL premix     1,000 mg 200 mL/hr over 60 Minutes Intravenous Every 24 hours 02/20/17 1103     02/20/17 1200  vancomycin (VANCOCIN) 1,500 mg in sodium chloride 0.9 % 500 mL IVPB     1,500 mg 250 mL/hr over 120 Minutes Intravenous  Once 02/20/17 1103 02/20/17 1646   02/19/17 2200  ceFEPIme (MAXIPIME) 1 g in dextrose 5 % 50 mL IVPB     1 g 100 mL/hr over 30 Minutes Intravenous Every 12 hours 02/19/17 2149     02/14/17 1000  amoxicillin-clavulanate (AUGMENTIN) 875-125 MG per tablet 1 tablet  Status:  Discontinued     1 tablet Oral Every 12 hours 02/14/17 0716 02/18/17 2046   02/11/17 2200   piperacillin-tazobactam (ZOSYN) IVPB 3.375 g  Status:  Discontinued     3.375 g 12.5 mL/hr over 240 Minutes Intravenous Every 8 hours 02/11/17 1633 02/12/17 1022   02/11/17 0800  vancomycin (VANCOCIN) IVPB 750 mg/150 ml premix  Status:  Discontinued     750 mg 150 mL/hr over 60 Minutes Intravenous Every 12 hours 02/10/17 2035 02/11/17 1436   02/10/17 2200  piperacillin-tazobactam (ZOSYN) IVPB 3.375 g  Status:  Discontinued     3.375 g 12.5 mL/hr over 240 Minutes Intravenous Every 8 hours 02/10/17 2031 02/11/17 1436   02/10/17 1900  vancomycin (VANCOCIN) 1,500 mg in sodium chloride 0.9 % 500 mL IVPB     1,500 mg 250 mL/hr over 120 Minutes Intravenous  Once 02/10/17 1834 02/10/17 2120   02/10/17 1445  cefTRIAXone (ROCEPHIN) 1 g in dextrose 5 % 50 mL IVPB  Status:  Discontinued     1 g 100 mL/hr over 30 Minutes Intravenous  Once 02/10/17 1441 02/10/17 1444   02/10/17 1445  piperacillin-tazobactam (ZOSYN) IVPB 3.375 g     3.375 g 100 mL/hr over 30 Minutes Intravenous  Once 02/10/17 1444 02/10/17 1553     Subjective: Seen and examined at had labored breathing on BiPAP and acutely worsened than yesterday. Appeared fatigued and pale. Difficult to rouse and responded some. Discussed with wife at bedside about his  risk of decompensating further was high. Palliative met with family and after lengthy discussion goals of care are to transition the patient more toward comfort and not escalate care and possible transition to [facility] in AM.   Objective: Vitals:   02/20/17 1100 02/20/17 1234 02/20/17 1235 02/20/17 1600  BP:  125/64 125/64 123/71  Pulse: (!) 116 99 99 93  Resp: (!) 22 (!) 23 (!) 25 (!) 23  Temp: 100 F (37.8 C)   99.2 F (37.3 C)  TempSrc: Axillary   Axillary  SpO2: 96% 95% 95% 97%  Weight:      Height:        Intake/Output Summary (Last 24 hours) at 02/20/2017 1732 Last data filed at 02/20/2017 1421 Gross per 24 hour  Intake 550 ml  Output 1100 ml  Net -550 ml    Filed Weights   02/15/17 0431 02/16/17 0501 02/17/17 0434  Weight: 84.2 kg (185 lb 10 oz) 77.2 kg (170 lb 1.6 oz) 76 kg (167 lb 8.8 oz)   Examination: Physical Exam:  Constitutional: Ill appearing male who is more fatigued and in Respiratory Distress on BiPAP Eyes: Sclerae anicteric. Conjunctivae slightly pale ENMT: External Ears and nose appear normal. Poor Dentition  Neck: Supple with no JVD Respiratory: Diminished bilaterally with coarse breath sounds, crackles and mild rhonchi. Had labored breathing and was tachyphenic on BiPAP Cardiovascular: Tachycardic rate and rhythm appeared sinus. No appreciable LE Edema Abdomen: Soft, NT, ND. Bowel sounds present GU: [place] Musculoskeletal: No contractures; Mild cyanosis Skin: Appeared cold; No appreciable rashes or lesions Neurologic: Somnolent and lethargic. Follows some commands Psychiatric: Impaired judgement and insight. Fatigued appearing. Not alert.  Data Reviewed: I have personally reviewed following labs and imaging studies  CBC: Recent Labs  Lab 02/16/17 0608 02/17/17 0606 02/17/17 1159 02/18/17 0338 02/19/17 0345 02/20/17 0239  WBC 11.0* 11.4* 13.3* 10.1 10.3 9.0  NEUTROABS 9.4* 10.5*  --  8.8* 9.7* 7.8*  HGB 8.4* 8.7* 9.0* 9.0* 9.4* 10.3*  HCT 26.0* 26.4* 27.5* 27.7* 28.8* 32.9*  MCV 95.9 96.0 96.2 96.5 95.4 95.4  PLT 62* 62* 59* 57* 54* 61*   Basic Metabolic Panel: Recent Labs  Lab 02/16/17 0608 02/17/17 0606 02/18/17 0338 02/19/17 0345 02/20/17 0239  NA 137 139 137 135 137  K 3.5 3.3* 3.2* 3.1* 3.6  CL 99* 94* 90* 88* 86*  CO2 26 32 33* 33* 33*  GLUCOSE 184* 131* 156* 146* 168*  BUN 27* 23* 28* 33* 43*  CREATININE 1.09 1.21 1.27* 1.24 1.50*  CALCIUM 9.2 8.9 8.9 9.0 9.3  MG 2.2 2.1 2.0 1.9 2.0  PHOS 3.2 3.1 3.1 2.9 3.4   GFR: Estimated Creatinine Clearance: 38 mL/min (A) (by C-G formula based on SCr of 1.5 mg/dL (H)). Liver Function Tests: Recent Labs  Lab 02/16/17 0608 02/17/17 0606  02/18/17 0338 02/19/17 0345 02/20/17 0239  AST '28 25 27 30 '$ 40  ALT '22 20 20 20 23  '$ ALKPHOS 74 76 80 83 94  BILITOT 1.0 1.1 1.3* 1.3* 1.3*  PROT 6.7 6.3* 6.9 7.1 7.4  ALBUMIN 2.8* 2.6* 2.7* 2.7* 2.8*   No results for input(s): LIPASE, AMYLASE in the last 168 hours. No results for input(s): AMMONIA in the last 168 hours. Coagulation Profile: No results for input(s): INR, PROTIME in the last 168 hours. Cardiac Enzymes: No results for input(s): CKTOTAL, CKMB, CKMBINDEX, TROPONINI in the last 168 hours. BNP (last 3 results) No results for input(s): PROBNP in the last 8760 hours. HbA1C: No  results for input(s): HGBA1C in the last 72 hours. CBG: Recent Labs  Lab 02/19/17 1154 02/19/17 1659 02/19/17 2103 02/20/17 0922 02/20/17 1220  GLUCAP 177* 207* 252* 185* 169*   Lipid Profile: No results for input(s): CHOL, HDL, LDLCALC, TRIG, CHOLHDL, LDLDIRECT in the last 72 hours. Thyroid Function Tests: No results for input(s): TSH, T4TOTAL, FREET4, T3FREE, THYROIDAB in the last 72 hours. Anemia Panel: No results for input(s): VITAMINB12, FOLATE, FERRITIN, TIBC, IRON, RETICCTPCT in the last 72 hours. Sepsis Labs: Recent Labs  Lab 02/20/17 1041  PROCALCITON 1.26    Recent Results (from the past 240 hour(s))  MRSA PCR Screening     Status: None   Collection Time: 02/14/17  7:49 AM  Result Value Ref Range Status   MRSA by PCR NEGATIVE NEGATIVE Final    Comment:        The GeneXpert MRSA Assay (FDA approved for NASAL specimens only), is one component of a comprehensive MRSA colonization surveillance program. It is not intended to diagnose MRSA infection nor to guide or monitor treatment for MRSA infections.     Radiology Studies: Ct Chest Wo Contrast  Result Date: 02/19/2017 CLINICAL DATA:  Hypoxia.  History of COPD. EXAM: CT CHEST WITHOUT CONTRAST TECHNIQUE: Multidetector CT imaging of the chest was performed following the standard protocol without IV contrast. COMPARISON:   02/10/2017 FINDINGS: Cardiovascular: Enlarged cardiac silhouette. Heavy calcific atherosclerotic disease of the aorta and coronary arteries. Normal caliber of the thoracic aorta. Postsurgical changes from CABG. Mediastinum/Nodes: No enlarged mediastinal or axillary lymph nodes. Thyroid gland, trachea, and esophagus demonstrate no significant findings. Lungs/Pleura: Moderate to advanced paraseptal and centrilobular emphysema with large bulla formation in the posterior aspects of the bilateral lower lobes. Mild bronchiectasis with bronchial wall thickening. The previously mentioned 8 mm nodule in lingula measures 10 mm, which may be due to measurement techniques. There has been interval development of numerous miliary few mm nodules throughout both lungs. A cluster of such nodules measuring up to 5 mm could be seen in the right middle lobe, image 92/153, sequence 4. There has also been interval development of small bilateral pleural effusions with subsegmental atelectasis or airspace consolidation. Upper Abdomen: Benign-appearing right renal cyst. Musculoskeletal: No chest wall mass or suspicious bone lesions identified. IMPRESSION: Enlarged cardiac silhouette. Heavy calcific atherosclerotic disease of the coronary arteries. Advanced emphysematous changes with large bulla formation in the posterior lower lobes. Bronchiectasis with chronic bronchitic changes. Interval development of numerous miliary soft tissue and ground-glass opacities throughout both lungs. The rapid development of these nodules suggests infectious/inflammatory causes. Interval development of small pleural effusions with lower lobe atelectasis versus confluent airspace consolidation. Dominant 10 mm lingular spiculated nodule is suspicious for malignancy. Consider one of the following in 3 months for both low-risk and high-risk individuals: (a) repeat chest CT, (b) follow-up PET-CT, or (c) tissue sampling. This recommendation follows the consensus  statement: Guidelines for Management of Incidental Pulmonary Nodules Detected on CT Images: From the Fleischner Society 2017; Radiology 2017; 284:228-243. Aortic Atherosclerosis (ICD10-I70.0) and Emphysema (ICD10-J43.9). Electronically Signed   By: Fidela Salisbury M.D.   On: 02/19/2017 18:56   Dg Chest Port 1 View  Result Date: 02/20/2017 CLINICAL DATA:  Shortness of breath EXAM: PORTABLE CHEST 1 VIEW COMPARISON:  02/19/2017; 02/13/2017; 02/10/2017; 05/27/2013; chest CT-02/19/2017; 02/10/2017 FINDINGS: Grossly unchanged cardiac silhouette and mediastinal contours post median sternotomy. Atherosclerotic plaque within a tortuous thoracic aorta. The lungs appear hyperexpanded with flattening of the diaphragms and mild diffuse slightly nodular thickening of the  pulmonary interstitium. No focal airspace opacities. No definite pleural effusion, though note, the right costophrenic angle is excluded from view. No pneumothorax. No evidence of edema. No acute osseus abnormalities. IMPRESSION: Similar findings of lung hyperexpansion and chronic bronchitic change without superimposed acute cardiopulmonary disease. Electronically Signed   By: Sandi Mariscal M.D.   On: 02/20/2017 08:55   Dg Chest Port 1 View  Result Date: 02/19/2017 CLINICAL DATA:  84 year old male with shortness of breath. EXAM: PORTABLE CHEST 1 VIEW COMPARISON:  Chest x-ray 02/18/2017. FINDINGS: Patchy areas of septal thickening and peribronchial cuffing noted throughout the lungs bilaterally, most evident in the mid to lower lungs, similar to the prior examination. No confluent consolidative airspace disease. No pleural effusions. No evidence of pulmonary edema. Heart size is normal. Upper mediastinal contours are within normal limits. Aortic atherosclerosis. Status post median sternotomy. IMPRESSION: 1. Minimal residual interstitial pulmonary edema again noted throughout the mid to lower lungs bilaterally. 2. Aortic atherosclerosis. Electronically  Signed   By: Vinnie Langton M.D.   On: 02/19/2017 09:24   Scheduled Meds: . arformoterol  15 mcg Nebulization BID  . budesonide (PULMICORT) nebulizer solution  0.25 mg Nebulization BID  . carvedilol  6.25 mg Oral BID WC  . [START ON 02/21/2017] feeding supplement (ENSURE ENLIVE)  237 mL Oral BID BM  . furosemide  40 mg Intravenous Once  . furosemide  80 mg Intravenous BID  . levothyroxine  137 mcg Oral QAC breakfast  . pantoprazole  40 mg Oral Daily  . potassium chloride  40 mEq Oral BID  . predniSONE  40 mg Oral Q breakfast   Continuous Infusions: . sodium chloride    . ceFEPime (MAXIPIME) IV Stopped (02/20/17 1043)  . [START ON 02/21/2017] vancomycin      LOS: 10 days   Kerney Elbe, DO [facility] Pager [phone]  If 7PM-7AM, please contact night-coverage [email or web address] Password [facility] 02/20/2017, 5:32 PM

## 2017-02-20 NOTE — Consult Note (Signed)
Consultation Note Date: 02/20/2017   Patient Name: Dillon Sweeney  DOB: 09/04/1932  MRN: [id number]  Age / Sex: 84 y.o., male  PCP: Eustaquio Maize, MD Referring Physician: Kerney Elbe, DO  Reason for Consultation: Establishing goals of care and Psychosocial/spiritual support  HPI/Patient Profile: 84 y.o. male   admitted on 02/10/2017 with past medical history significant for CABG- 1986, AAA, DM, HTN, Hypothyroidism.  Admitted thru the ED today with complaints of chest pain and difficulty breathing of 3 days duration.   Today is day 10 of this hospital stay  Hospital stay complicated by an aspiration event, dramatic decline over the past 12 hours  Family face advanced directive decisions, EOL wishes and anticipatory care needs.    Clinical Assessment and Goals of Care:  This NP Wadie Lessen reviewed medical records, received report from team, assessed the patient and then meet at the patient's bedside along with his wife/Rose and his daughter/Diana/HPOA  to discuss diagnosis, prognosis, GOC, EOL wishes disposition and options.  Concept of Hospice and Palliative Care were discussed  A detailed discussion was had today regarding advanced directives.  Concepts specific to code status, artifical feeding and hydration, continued IV antibiotics and rehospitalization was had.  The difference between a aggressive medical intervention path  and a palliative comfort care path for this patient at this time was had.  Values and goals of care important to patient and family were attempted to be elicited.  Family report continued physical, functional and cognitive decline over the past 6 months. They have an understanding of the seriousness of the situation and they have a very clear understanding of this man's EOL wishes.  Family has made decision to shift to a comfort path and focus on comfort and dignity,  minimizing life prolonging measures  Emotional support offered  Natural trajectory and expectations at EOL were discussed.  Questions and concerns addressed.   Family encouraged to call with questions or concerns.    PMT will continue to support holistically.   HCPOA/daughter    SUMMARY OF RECOMMENDATIONS    Code Status/Advance Care Planning:  DNR/DNI- documented today   Symptom Management:   Pain/Dyspnea: Fentanyl 50 mcg every 30 minutes as needs for comfort (consider a continuous gtt dependant on need)  Agitation: Ativan 2 mg IV every 4 hrs prn  Palliative Prophylaxis:  - aspiration, constipation, pain control    Additional Recommendations (Limitations, Scope, Preferences):  Focus of care is comfort, quality and dignity   Family request continuation of         Antibiotics *     No escalation of care, let nature take          its course   *    No further lab draws or CBG checks *    Medications as need to enhance       comfort  Psycho-social/Spiritual:   Desire for further Chaplaincy support: declines spiritual care at this time       Additional Recommendations:  Education of hospice Prognosis:   Hours to days  Discharge Planning:  To be determined     Primary Diagnoses: Present on Admission: . Chest pain . Abdominal aortic aneurysm ([place]) . HTN (hypertension) . Myocardial infarction . Tobacco use disorder . Emphysema lung ([place])   I have reviewed the medical record, interviewed the patient and family, and examined the patient. The following aspects are pertinent.  Past Medical History:  Diagnosis Date  . AAA (abdominal aortic aneurysm) ([place])   . Atherosclerosis   . Bladder stones   . CAD (coronary artery disease)   . Cholelithiasis   . Chronic constipation   . Diabetes mellitus    Type 2  . Dyslipidemia   . Emphysema lung ([employer]) 02/19/2017  . Hyperlipidemia   . Hypertension   . Hypothyroidism   . Myocardial infarction ([place])  09/22/10   1986  . Pneumonia 2012   Social History   Socioeconomic History  . Marital status: Married    Spouse name: None  . Number of children: None  . Years of education: None  . Highest education level: None  Social Needs  . Financial resource strain: None  . Food insecurity - worry: None  . Food insecurity - inability: None  . Transportation needs - medical: None  . Transportation needs - non-medical: None  Occupational History  . None  Tobacco Use  . Smoking status: Former Smoker    Packs/day: 1.00    Types: Cigarettes    Last attempt to quit: 10/01/2016    Years since quitting: 0.3  . Smokeless tobacco: Never Used  Substance and Sexual Activity  . Alcohol use: No  . Drug use: No  . Sexual activity: None  Other Topics Concern  . None  Social History Narrative  . None   Family History  Problem Relation Age of Onset  . Stroke Father   . Cancer Sister    Scheduled Meds: . arformoterol  15 mcg Nebulization BID  . budesonide (PULMICORT) nebulizer solution  0.25 mg Nebulization BID  . carvedilol  6.25 mg Oral BID WC  . furosemide  40 mg Intravenous Once  . furosemide  80 mg Intravenous BID  . guaiFENesin  1,200 mg Oral BID  . insulin aspart  0-9 Units Subcutaneous TID WC  . levothyroxine  137 mcg Oral QAC breakfast  . pantoprazole  40 mg Oral Daily  . potassium chloride  40 mEq Oral BID  . predniSONE  40 mg Oral Q breakfast  . rosuvastatin  40 mg Oral q1800   Continuous Infusions: . sodium chloride    . ceFEPime (MAXIPIME) IV Stopped (02/20/17 1043)  . vancomycin 1,500 mg (02/20/17 1421)  . [START ON 02/21/2017] vancomycin     PRN Meds:.acetaminophen, benzonatate, ondansetron **OR** ondansetron (ZOFRAN) IV Medications Prior to Admission:  Prior to Admission medications   Medication Sig Start Date End Date Taking? Authorizing Provider  acetaminophen (TYLENOL) 500 MG tablet Take 1,000 mg by mouth every 6 (six) hours as needed for mild pain or moderate pain.    Yes [provider]  amLODipine (NORVASC) 5 MG tablet Take 1 tablet (5 mg total) by mouth daily. 09/16/16  Yes Eustaquio Maize, MD  aspirin 81 MG tablet Take 81 mg by mouth daily.     Yes [provider]  benazepril (LOTENSIN) 40 MG tablet Take 1 tablet (40 mg total) by mouth daily. 09/16/16  Yes Eustaquio Maize, MD  Cholecalciferol (VITAMIN D) 2000 UNITS CAPS Take 1  capsule by mouth daily.    Yes [provider]  levothyroxine (SYNTHROID, LEVOTHROID) 150 MCG tablet Take 1 tablet (150 mcg total) by mouth daily. 09/16/16  Yes Eustaquio Maize, MD  metFORMIN (GLUCOPHAGE) 500 MG tablet Take 1 tablet (500 mg total) by mouth daily with breakfast. 09/16/16  Yes Eustaquio Maize, MD  metoprolol tartrate (LOPRESSOR) 100 MG tablet Take 1 tablet (100 mg total) by mouth daily. 09/16/16  Yes Eustaquio Maize, MD  rosuvastatin (CRESTOR) 40 MG tablet TAKE ONE TABLET BY MOUTH EVERY DAY 09/16/16  Yes Eustaquio Maize, MD   Allergies  Allergen Reactions  . Morphine And Related     hallucinations   Review of Systems  Unable to perform ROS: Acuity of condition    Physical Exam  Constitutional: He appears cachectic. He appears ill.  -minimally responsive, on BiPap  Cardiovascular: Tachycardia present.  Pulmonary/Chest: He has decreased breath sounds in the right lower field and the left lower field.  Skin: Skin is warm and dry.    Vital Signs: BP 125/64   Pulse 99   Temp 100 F (37.8 C) (Axillary)   Resp (!) 25   Ht 5\' 9"  (1.753 m)   Wt 76 kg (167 lb 8.8 oz)   SpO2 95%   BMI 24.74 kg/m  Pain Assessment: No/denies pain POSS *See Group Information*: 1-Acceptable,Awake and alert Pain Score: 0-No pain   SpO2: SpO2: 95 % O2 Device:SpO2: 95 % O2 Flow Rate: .O2 Flow Rate (L/min): 40 L/min  IO: Intake/output summary:   Intake/Output Summary (Last 24 hours) at 02/20/2017 1528 Last data filed at 02/20/2017 1421 Gross per 24 hour  Intake 550 ml  Output 1100 ml  Net -550 ml     LBM: Last BM Date: 02/16/17 Baseline Weight: Weight: 81.6 kg (180 lb)(scale B) Most recent weight: Weight: 76 kg (167 lb 8.8 oz)     Palliative Assessment/Data: 20 %   Discussed with Dr Alfredia Ferguson  Time In: 1600 Time Out: 1715 Time Total: 75 minutes Greater than 50%  of this time was spent counseling and coordinating care related to the above assessment and plan.  Signed by: Wadie Lessen, NP   Please contact Palliative Medicine Team phone at [phone] for questions and concerns.  For individual provider: See Shea Evans

## 2017-02-20 NOTE — Progress Notes (Signed)
Seen by palliative and had a meeting with family. Placed on comfort care.

## 2017-02-20 NOTE — Progress Notes (Signed)
Pt in ST HR 120-130s, occasional PVCs. RR 30-35. Increase work of breathing. On HFNC 8L. Paged on call NP. Order received to give Ativan 0.5 mg, Lopressor 2.5 mg and place pt back on Bipap. Educate pt on the need for BiPAP. Will continue to monitor pt.

## 2017-02-20 NOTE — Progress Notes (Signed)
RT removed patient from bipap and placed on 4L [place] per MD order as patient is now comfort care per family wishes. RT will continue to monitor.

## 2017-02-20 NOTE — Progress Notes (Signed)
[facility] Pulmonary Diseases & Critical Care Medicine Initial Pulmonary Consultation  Patient Name: RODRIGUES URBANEK MRN: [id number] DOB: 17-Nov-1932    ADMISSION DATE:  02/10/2017 CONSULTATION DATE:  02/19/2017  REFERRING MD:  Dr. Jenkins Rouge  REASON FOR CONSULTATION:  hypoxia   HISTORY OF PRESENT ILLNESS  This 84 y.o. Caucasian male reformed smoker is seen in consultation at the request of Dr. Jenkins Rouge for recommendations on further evaluation and management of hypoxia and COPD. He was initially admitted on 02/10/2017 after experienceing chest pain for 3 days prior to presentation. During his  ER evaluation, he was found to have elevated tropoinins. He was medically managed for NSTEMI.  The patient was previously established with [facility], but he reports that his doctor retired. He has not been back. He used to be on a maintenance regimen for COPD but has not been using aerosols or inhalers since his doctor retired. His daughter is at the bedside and reports that she has access to albuterol nebs, which she provided. They both agree that the nebs seemed to help. The patient does not recall if he has ever been assessed for need for long-term oxygen therapy. He endorses exertional dyspnea. Currently, he denies dyspnea at rest. He denies anginal chest pain but reports that he sometimes gets pains between his shoulders, typically in the back in one hemithorax or another.   Subjective Sedated looks comfortable on BIPAP  VITAL SIGNS: BP 129/84   Pulse (!) 109   Temp (!) 100.6 F (38.1 C) (Axillary)   Resp (!) 26   Ht 5\' 9"  (1.753 m)   Wt 167 lb 8.8 oz (76 kg)   SpO2 96%   BMI 24.74 kg/m   HEMODYNAMICS:    VENTILATOR SETTINGS: FiO2 (%):  [40 %] 40 %  INTAKE / OUTPUT:  Intake/Output Summary (Last 24 hours) at 02/20/2017 0959 Last data filed at 02/20/2017 0500 Gross per 24 hour  Intake 50 ml  Output 1900 ml  Net -1850 ml     PHYSICAL EXAMINATION: General:  frail 84 year old white male. Currently on NIPPV.  HENT: poor dentition. No JVD. MMM Pulm: decreased t/o. Accessory use w/ exertion on BIPAP Card: RRR  Ext: no sig edema, warm + pulses Abd: soft not tender Neuro: sleepy but will wake up and f/c  LABS:  BMET Recent Labs  Lab 02/18/17 0338 02/19/17 0345 02/20/17 0239  NA 137 135 137  K 3.2* 3.1* 3.6  CL 90* 88* 86*  CO2 33* 33* 33*  BUN 28* 33* 43*  CREATININE 1.27* 1.24 1.50*  GLUCOSE 156* 146* 168*    Electrolytes Recent Labs  Lab 02/18/17 0338 02/19/17 0345 02/20/17 0239  CALCIUM 8.9 9.0 9.3  MG 2.0 1.9 2.0  PHOS 3.1 2.9 3.4    CBC Recent Labs  Lab 02/18/17 0338 02/19/17 0345 02/20/17 0239  WBC 10.1 10.3 9.0  HGB 9.0* 9.4* 10.3*  HCT 27.7* 28.8* 32.9*  PLT 57* 54* 61*    Coag's No results for input(s): APTT, INR in the last 168 hours.  Sepsis Markers No results for input(s): LATICACIDVEN, PROCALCITON, O2SATVEN in the last 168 hours.  ABG Recent Labs  Lab 02/17/17 1120 02/19/17 0938  PHART 7.501* 7.496*  PCO2ART 46.1 49.6*  PO2ART 87.2 58.2*    Liver Enzymes Recent Labs  Lab 02/18/17 0338 02/19/17 0345 02/20/17 0239  AST 27 30 40  ALT 20 20 23   ALKPHOS 80 83 94  BILITOT 1.3* 1.3* 1.3*  ALBUMIN 2.7*  2.7* 2.8*    Cardiac Enzymes No results for input(s): TROPONINI, PROBNP in the last 168 hours.  Glucose Recent Labs  Lab 02/18/17 2134 02/19/17 0804 02/19/17 1154 02/19/17 1659 02/19/17 2103 02/20/17 0922  GLUCAP 216* 140* 177* 207* 252* 185*    Imaging Ct Chest Wo Contrast  Result Date: 02/19/2017 CLINICAL DATA:  Hypoxia.  History of COPD. EXAM: CT CHEST WITHOUT CONTRAST TECHNIQUE: Multidetector CT imaging of the chest was performed following the standard protocol without IV contrast. COMPARISON:  02/10/2017 FINDINGS: Cardiovascular: Enlarged cardiac silhouette. Heavy calcific atherosclerotic disease of the aorta and coronary arteries. Normal caliber of the thoracic aorta.  Postsurgical changes from CABG. Mediastinum/Nodes: No enlarged mediastinal or axillary lymph nodes. Thyroid gland, trachea, and esophagus demonstrate no significant findings. Lungs/Pleura: Moderate to advanced paraseptal and centrilobular emphysema with large bulla formation in the posterior aspects of the bilateral lower lobes. Mild bronchiectasis with bronchial wall thickening. The previously mentioned 8 mm nodule in lingula measures 10 mm, which may be due to measurement techniques. There has been interval development of numerous miliary few mm nodules throughout both lungs. A cluster of such nodules measuring up to 5 mm could be seen in the right middle lobe, image 92/153, sequence 4. There has also been interval development of small bilateral pleural effusions with subsegmental atelectasis or airspace consolidation. Upper Abdomen: Benign-appearing right renal cyst. Musculoskeletal: No chest wall mass or suspicious bone lesions identified. IMPRESSION: Enlarged cardiac silhouette. Heavy calcific atherosclerotic disease of the coronary arteries. Advanced emphysematous changes with large bulla formation in the posterior lower lobes. Bronchiectasis with chronic bronchitic changes. Interval development of numerous miliary soft tissue and ground-glass opacities throughout both lungs. The rapid development of these nodules suggests infectious/inflammatory causes. Interval development of small pleural effusions with lower lobe atelectasis versus confluent airspace consolidation. Dominant 10 mm lingular spiculated nodule is suspicious for malignancy. Consider one of the following in 3 months for both low-risk and high-risk individuals: (a) repeat chest CT, (b) follow-up PET-CT, or (c) tissue sampling. This recommendation follows the consensus statement: Guidelines for Management of Incidental Pulmonary Nodules Detected on CT Images: From the Fleischner Society 2017; Radiology 2017; 284:228-243. Aortic Atherosclerosis  (ICD10-I70.0) and Emphysema (ICD10-J43.9). Electronically Signed   By: Fidela Salisbury M.D.   On: 02/19/2017 18:56   Dg Chest Port 1 View  Result Date: 02/20/2017 CLINICAL DATA:  Shortness of breath EXAM: PORTABLE CHEST 1 VIEW COMPARISON:  02/19/2017; 02/13/2017; 02/10/2017; 05/27/2013; chest CT-02/19/2017; 02/10/2017 FINDINGS: Grossly unchanged cardiac silhouette and mediastinal contours post median sternotomy. Atherosclerotic plaque within a tortuous thoracic aorta. The lungs appear hyperexpanded with flattening of the diaphragms and mild diffuse slightly nodular thickening of the pulmonary interstitium. No focal airspace opacities. No definite pleural effusion, though note, the right costophrenic angle is excluded from view. No pneumothorax. No evidence of edema. No acute osseus abnormalities. IMPRESSION: Similar findings of lung hyperexpansion and chronic bronchitic change without superimposed acute cardiopulmonary disease. Electronically Signed   By: Sandi Mariscal M.D.   On: 02/20/2017 08:55     STUDIES:  CTA chest/abd/pelvis (02/10/2017): B emphysematous changes. Solitary pulmonary nodule (8 mm, lingula). No lymphadenopathy. L UVJ calculus. R bladder calculus. B renal cysts.  CULTURES:   ANTIBIOTICS: Cefepime 1/20>>> vanc 1/21>>>  SIGNIFICANT EVENTS: -  LINES/TUBES: -   ASSESSMENT / PLAN:  Acute on chronic hypoxic respiratory failure. Multifactorial: COPD, decompensated heart failure, +/- HCAP and deconditioning. I think this am's exam may be complicated by recent administration of benzos  Plan Cont mandatory HS  BIPAP and PRN BIPAP Cont scheduled BDs Cont lasix  No change in pred (not convinced he is having acute exacerbation) Day # 2 cefepime and add vanc  PCT algo  Will re-assess him later today hope to avoid intubation  Agree w/ palliative consult. Would NOT be a good candidate for intubation as risk of prolonged vent and would be difficult if not impossible to wean off.    8 mm pulmonary nodule PLAN F/u as outpt  Acute on chronic systolic HF w recent NSTEMI (EF 35-40%) Atrial fib Plan Cont coreg Cont tele Cont lasix  Further recs per cards  Acute on chronic renal failure Plan Trend Cr w/ diuresis. May need to back down diuretics   Delirium Plan Dc haldol given sedation   Anemia w/ thrombocytopenia  Plan F/b heme    FAMILY  - Updates: daughter at bedside.  - Inter-disciplinary family meet or Palliative Care meeting due by:  day [place], NP  02/20/2017, 9:58 AM  Attending Note:  84 year old male with an extensive PMH who presents to [facility] with acute on chronic hypoxemic respiratory failure that required BiPAP overnight.  On 1/21, PCCM was called by primary service with patient being unresponsive and paradoxical on BiPAP.  On exam, he is arousable and protecting his airway but concern for WOB is present.  No need for immediate intubation right now.  Patient ideally needs palliative care to see as intubation here would be very ill advised.  Continue tele monitoring for now.  Diureses as ordered.  Abx for broad spectrum and PCCM will follow.  The patient is critically ill with multiple organ systems failure and requires high complexity decision making for assessment and support, frequent evaluation and titration of therapies, application of advanced monitoring technologies and extensive interpretation of multiple databases.   Critical Care Time devoted to patient care services described in this note is  35  Minutes. This time reflects time of care of this signee Dr Jennet Maduro. This critical care time does not reflect procedure time, or teaching time or supervisory time of PA/NP/Med student/Med Resident etc but could involve care discussion time.  Rush Farmer, M.D. [facility] Pulmonary/Critical Care Medicine. Pager: [phone]. After hours pager: [phone].

## 2017-02-20 NOTE — Progress Notes (Signed)
Nutrition Follow-up  DOCUMENTATION CODES:   Severe malnutrition in context of chronic illness  INTERVENTION:   -Ensure Enlive po BID, each supplement provides 350 kcal and 20 grams of protein  NUTRITION DIAGNOSIS:   Severe Malnutrition related to chronic illness(CAD) as evidenced by energy intake < 75% for > or equal to 1 month, severe muscle depletion.  Ongoing  GOAL:   Patient will meet greater than or equal to 90% of their needs  Progressing  MONITOR:   PO intake, Supplement acceptance, Labs, Weight trends, Skin, I & O's  REASON FOR ASSESSMENT:   Consult Assessment of nutrition requirement/status  ASSESSMENT:   Dillon Sweeney is a 84 y.o. man admitted on 1/11 after visiting his PCP for shortness of breath and being referred to the hospital  1/18- rapid response called due to respiratory distress from fluid overload, transferred to SDU 1/20- per PCCM notes, 8 mm pulmonary nodule was found  Pt on Bi-pap at time of visit. Meal completion variable (20-100%), ranging around 45-50%.   PCCM recommending palliative care consult.   Medications reviewed and include prednisone.   Labs reviewed: CBGS: 169-185 (inpatient orders for glycemic control are 0-9 units insulin aspart TID with meals).   Diet Order:  Diet heart healthy/carb modified Room service appropriate? Yes; Fluid consistency: Thin  EDUCATION NEEDS:   Education needs have been addressed  Skin:  Skin Assessment: Reviewed RN Assessment  Last BM:  02/20/17  Height:   Ht Readings from Last 1 Encounters:  02/11/17 5\' 9"  (1.753 m)    Weight:   Wt Readings from Last 1 Encounters:  02/17/17 167 lb 8.8 oz (76 kg)    Ideal Body Weight:  72.7 kg  BMI:  Body mass index is 24.74 kg/m.  Estimated Nutritional Needs:   Kcal:  1700-1900  Protein:  95-110 grams  Fluid:  1.7-1.9 L    Dillon Sweeney A. Jimmye Norman, RD, LDN, CDE Pager: [phone] After hours Pager: [phone]

## 2017-02-21 DIAGNOSIS — R0789 Other chest pain: Secondary | ICD-10-CM

## 2017-02-21 MED ORDER — ENSURE ENLIVE PO LIQD: 237.0000 mL | Freq: Two times a day (BID) | ORAL | 12 refills | Status: AC

## 2017-02-21 MED ORDER — ONDANSETRON HCL 4 MG PO TABS: 4.0000 mg | ORAL_TABLET | Freq: Three times a day (TID) | ORAL | 0 refills | Status: AC | PRN

## 2017-02-21 MED ORDER — LORAZEPAM 0.5 MG PO TABS: 0.5000 mg | ORAL_TABLET | Freq: Three times a day (TID) | ORAL | 0 refills | Status: AC | PRN

## 2017-02-21 MED ORDER — ARFORMOTEROL TARTRATE 15 MCG/2ML IN NEBU: 15.0000 ug | INHALATION_SOLUTION | Freq: Two times a day (BID) | RESPIRATORY_TRACT | 0 refills | Status: AC

## 2017-02-21 MED ORDER — LORAZEPAM 2 MG/ML IJ SOLN
2.0000 mg | INTRAMUSCULAR | Status: DC | PRN
Start: 2017-02-21 — End: 2017-02-21

## 2017-02-21 MED ORDER — LORAZEPAM 1 MG PO TABS: 2.0000 mg | ORAL_TABLET | ORAL | Status: DC | PRN

## 2017-02-21 MED ORDER — FUROSEMIDE 40 MG PO TABS: 40.0000 mg | ORAL_TABLET | Freq: Two times a day (BID) | ORAL | 11 refills | Status: AC

## 2017-02-21 MED ORDER — OXYCODONE HCL 5 MG PO TABS: 5.0000 mg | ORAL_TABLET | Freq: Four times a day (QID) | ORAL | 0 refills | Status: AC | PRN

## 2017-02-21 NOTE — Progress Notes (Signed)
Patient ID: SIDHARTH LEVERETTE, male   DOB: 1932-02-17, 84 y.o.   MRN: [id number]  This NP visited patient at the bedside as a follow up to  yesterday's [place], for palliative needs and emotional support.    Patient is lethargic, appears comforatble and is able to communicate to his family his desire to go home.  Continued conversation regarding diagnosis,  prognosis, GOCs and EOL wishes.   Family understand the limited prognosis of days to a week.  Educated that anything could happen at anytime   Daughter has already contacted [facility].  They want to get him home today.  Goals of Care -focus is comfort, quality and dignity  -home with hospice for EOL life care today -symptom management  - CM notified of need for hospice setup  - no further life prolonging measures   Discussed with family the importance of continued conversation with family and their  medical providers regarding overall plan of care and treatment options,  ensuring decisions are within the context of the patients values and GOCs.  Questions and concerns addressed   Discussed with Dr Alfredia Ferguson  Time in  0945         Time out 1020   Total time spent on the unit was 35 minutes  Greater than 50% of the time was spent in counseling and coordination of care  Wadie Lessen NP  Palliative Medicine Team Team Phone # [phone] Pager [phone]

## 2017-02-21 NOTE — Progress Notes (Signed)
Palliative Medicine RN Note: Mrs Gluth left a message on the team phone reporting that equipment is being delivered. I spoke with [facility], who is already arranging transport.  Marjie Skiff Carmita Boom, RN, BSN, [facility] Palliative Medicine Team 02/21/2017 4:11 PM Office [phone]

## 2017-02-21 NOTE — Progress Notes (Addendum)
Discharged home by [facility] ambulance, discharged instructions  and prescription given to wife , belongings taken home. Wife requested for the condom cath . Not to be removed until he goes home to keep him dry while in transport. Ambulance staff aware.

## 2017-02-21 NOTE — Progress Notes (Signed)
Pts oxygen saturation decreasing into the 70's. Pt is on a nonrebreather mask at this time.  Pts wife at bedside. Will continue to monitor.

## 2017-02-21 NOTE — Discharge Summary (Signed)
Physician Discharge Summary  Dillon Sweeney CBS:[id number] DOB: 04-Jan-1933 DOA: 02/10/2017  PCP: Eustaquio Maize, MD  Admit date: 02/10/2017 Discharge date: 02/21/2017  Admitted From: Home Disposition:  Home with Hospice  Recommendations for Outpatient Follow-up:  1. Follow up Care per Hospice Protocol   Home Health: Yes  Equipment/Devices: Hospice Equipment     Discharge Condition: Guarded CODE STATUS: Comfort Care  Diet recommendation: Heart Healthy Carb Modified   Brief/Interim Summary: Dillon Sweeney a 84 y.o.man admitted on 1/11 after visiting his PCP for shortness of breath and being referred to the hospital. 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 Also has a history of AAA, diabetes, hypertension, hypothyroidism. In the ED he also complained of chest pain that he described as pressure-like radiating to both arms. In the ED he was found to meet SIRS criteria and was given broad-spectrum antibiotics. Because of elevated troponins with a high of 1.31, Cardiology was consulted overnight who recommended transfer to [facility] for further evaluation. However due to lack of bed availability patient remains in the emergencydepartment at [facility]. Admitted for Acute Respiratory Failure with Hypoxia, Acute Systolic CHF Exacerbation, and Dyspnea. Currently being diuresed and treated for Aspiration PNA and decompensated today as he had increased work of breathing. A rapid response was called and I went to the bedside to evaluate and Cardiology was already there. I spoke with Dr. Harrington Challenger who felt he decompensated because of his Heart Failure and she accepted the patient to her Service. He was placed on BiPAP and transferred to SDU. Patient was breathing a little harder today so was placed back on BiPAP. Cardiology consulting PCCM for further evaluation of Respiratory status. CT Scan done and showed possible Lung Cancer. I took over back  from Cardiology yesterday AM as he was seen and examined this AM and had been deteriorating more. Palliative met with the family and after a Goals of Care discussion they are focusing more toward comfort and there will be no escalation of Care. Patient made DNR and is going to go home with Hospice today.  Discharge Diagnoses:  Principal Problem:   Chest pain Active Problems:   Abdominal aortic aneurysm (HCC)   Diabetes (HCC)   HTN (hypertension)   Tobacco use disorder   Myocardial infarction   Pulmonary nodule   Dyspnea   Troponin level elevated   Thrombocytopenia (HCC)   Normocytic anemia   Acute on chronic systolic heart failure (HCC)   Protein-calorie malnutrition, severe   Emphysema lung ([place])   Acute respiratory failure with hypoxia ([place])   DNR (do not resuscitate)   Palliative care by specialist  Acute Respiratory Failure with Hypoxia now requiring NIPPV with BiPAP; BiPAP to be removed given transition to [place]  -Multifactorial -Patient qualified for 3 Liters of O2 given Walk screen for Home O2 but will be placed on 4 Liters of O2 for Comfort  -C/w Diuresis per Cards; po Lasix changed to IV Lasix 40 mg BID and then increased to 80 mg BID and will continue tonight  -Started IV Solumedrol 60 mg q12h and now weaned to po 40 mg daily  -Patient decompensated andwas transferred to SDU on BiPAP; Intermittently on BiPAP but will now stop BiPAP as patient continues to deteriorate and will be transitioning to [place] CT Chest and showed Enlarged cardiac silhouette. Heavy calcific atherosclerotic disease of the coronary arteries.\ Advanced emphysematous changes with  large bulla formation in the posterior lower lobes. Bronchiectasis with chronic bronchitic changes.Interval development of numerous miliary soft tissue and ground-glass opacities throughout both lungs. The rapid development of these nodules suggests infectious/inflammatory causes. Interval development of  small pleural effusions with lower lobe atelectasis versus confluent airspace consolidation. Dominant 10 mm lingular spiculated nodule is suspicious for malignancy -Palliative Met with Family for [place] discussion as patient was worsening. Plan is to transition to comfort care but will not escalate care. Will continue Nebs, Abx, and Lasix for now -Palliative adding IV Fentanyl for Comfort, and will continue Lorazepam  -Will send out on Po Oxycodone, Ativan, and Zofran -Further Care per Hospice as patient is being D/C'd Home with Hospice   Chest Pain -Resolved.  -Concern for NSTEMI however troponin trended down. -Cardiology consulted and assessment is this is likely related to anemia and Type 2 NSTEMI.  -Recommending stress test as an outpatient but will not pursue as Goals of Care will be to transition more towaard Comfort and will go Home with Hospice   Anemia and Thrombocytopenia -Unknown Etiology. ? Lung Cancer -Patient without history of GI bleeding.  -Per chart review, patient has had chronic anemia up until 2012.  -Last hemoglobin prior to admission was about one year prior. -History of colonoscopy within a few years with unknown results per patient. S/p 1 unit of PRBC since admission. FOBT negative. - DIC panel abnormal with elevated PT/PTT, fibrinogen and d-dimer. LDH elevated with slightly elevated total/indirect bilirubin. Unknown if this is chronic.Haptoglobin elevated. -Direct coombs negative. Hematology recommending supportive care with Transfusions  -Hb stable at 10.3 and Platelet Count was 9 -Hematology Recc's appreciated and felt as if continuing supportive Care -Will not repeat CBC in AM as patient transitioning more toward comfort and will be D/Cing Home with Hospice    Suspected COPD -Likely COPD, but patient does not have official diagnosis. Quit smoking three months ago. -CT Chest showed Advanced emphysematous changes with large bulla formation in the posterior lower  lobes. Bronchiectasis with chronic bronchitic changes. Interval development of numerous miliary soft tissue and ground-glass opacities throughout both lungs. The rapid development of these nodules suggests infectious/inflammatory causes. -Xopenx q 3 times daily stopped by Pulmonary -Dulera stopped by Pulmonary and patient started on Brovana/Pulmnicort and will continue  -Changedprednisone to IV Solumedrol and now will Decrease Solumedrol Dose to 40 mg IV Daily and eventually back to po Prednisone but will stop -Mucinex  -Cardiology consulting PCCM for evaluation  -PCCM recommending not Intubation as patient will likely never come off vent given advanced lung disease -Patient to go home with Hospice Today  Dyspnea with increased Work of Breathing  -Combination of bronchitis/COPD and pulmonary edema and Aspiration PNA. -Cardiology starting scheduled diuresis and it was held because of AKI; Now resumed IV 80 mg Daily  -Repeat CXR this AM showed Improved ventilation with regression of bilateral pulmonary edema and pleural effusions since 02/17/2017. Underlying emphysema. -CT Chest showed hecked CT Chest and showed Enlarged cardiac silhouette. Heavy calcific atherosclerotic disease of the coronary arteries. Advanced emphysematous changes with large bulla formation in the posterior lower lobes. Bronchiectasis with chronic bronchitic changes. Interval development of numerous miliary soft tissue and ground-glass opacities throughout both lungs. The rapid development of these nodules suggests infectious/inflammatory causes. Interval development of small pleural effusions with lower lobe atelectasis versus confluent airspace consolidation. Dominant 10 mm lingular spiculated nodule is suspicious for malignancy -Will start Broad Spectrum Abx again with Cefepime based on rapid development of Lung nodules  -  Cardiology consulted PCCM for further evaluation; Will not Escalate Care and will remove BiPAP and shift  focus toward comfort given deterioration and worsening; Will be D/C'ing Home with Hospice today.   Suspected Lung Cancer -CT Chest showed Dominant 10 mm lingular spiculated nodule is suspicious for malignancy. -Will now further workup now that shift is focusing toward comfort -Palliative Care consulted for further reccs; Focus is shifting toward comfort and will go home with Hospice today.   Acute Systolic Heart Failure -Unsure if this is chronic. EF of 35-40% with diffuse hypokinesis on echocardiogram from 1/14.  -Patient presented with concern for Nstemi. -Cardiology recommendations appreciated -Cardiology changed patient to Metoprolol Succinate 100 mg po Daily but will D/C now that patient is going to Hospice -Cardiology continuing to diurese with IV 80 mg BID; Got an additional 60 mg IV Lasix yesterday; Will change to po 40 mg BID for Com[place]fr -Strict I's/O's, Daily Weights, and SLIV -Patient is -7.750 mL and Weight is down -13 Lbs but ? Accuracy -Goal is to shift more toward palliation and will D/C Home with Hospice   Acute Kidney Injury -Likely secondary to diuresis. -Recommendeed to hold lasix initially  but will now resume at 40 mg po BID now that Cr is improved; Cardiology changed to IV 40 mg BID to IV 80 mg and gave an additional IV 60 mg Lasix -Cr worsening  -Will not repeat CMP in AM given transitioning toward Comfort care and patient being D/C'd Home with Hospice   Paroxysmal Atrial Fibrillation but likely was Sinus Rhythm -Likely rebound from decrease in metoprolol.EKG suggested Atrial fibrillation but Cardiology feels it was Sinus Rhtyhm with PAC's. Rate better controlled and was in Sinus toady -Metoprolol Succinate by Cardiology D/C'd now that patient is going home with Hospice  -Cardiology recommendations appreciated. Per Cards patient is not a candidate for Anticoagulation given recent Problems with GIB   SIRS from PNA, worsened  -Criteria met on admission. No  sourceinitially but now with evidence of aspiration pneumonia and was treated with Augmentin  -Given empiric antibiotics. Blood culture with no growth x5 days.  -Urine culture without growth. Chest x-ray suggesting pneumonia. -D/C'd Augmentin as completed 7 days of Abx. -CT Chest Done and showed ? Infectious/Inflammatory Lung Nodules; Will start IV Cefepime; Pulmonary added IV Vancomycin  -SLP Recommending Regular Diet with Thin Liquids but will transition patient toward [place] given Palliative Discussion with Family and will D/C Home with Hospice today   Aspiration Pneumonia/HCAP -Suggested on Chest x-ray earlier on Admission.  -D/C'd Augmentin as completed Course but added IV Cefepime and Vancomycin  -C/w Mucinex and Benzonatate -As above per Pulmonary Xopenex 0.63 mg Neb TID and Dulera -Started IV Solumedrol 60 mg q12h and changed to IV Solumedrol 60 mg Daily and now decreased to 40 mg IV Daily and now on Po -Repeat CXR 1/19 AM showed Improved ventilation with regression of bilateral pulmonary edema and pleural effusions since 02/17/2017.  Underlying emphysema -CT Chest done and showed findings above -Start Broad Spectrum Abx with Cefepime yesterday; Pulmonary added Vancomycin -Procalcitonin was 1.26 -Will not escalate care; BiPAP discontinued and will place on 4 Liters of O2 for comfort and will D/C Home with Hospice   Lung Nodule -CT Chest showed Interval development of numerous miliary soft tissue and ground-glass opacities throughout both lungs. The rapid development of these nodules suggests infectious/inflammatory causes. Interval development of small pleural effusions with lower lobe atelectasis versus confluent airspace consolidation. Dominant 10 mm lingular spiculated nodule is suspicious  for malignancy -Will not pursue workup as goals of care are shifting toward comfort and D/C'ing Home with Hospice  Diabetes Mellitus -C/w Sensitive Novolog SSI AC -CBG's ranging from  169-252; Will not do any more CBG checks as patient is being transitioned toward comfort care and will be D/Cing Home with Hospice  Delirium, improved but now Patient Somnolent  -Appears to have resolved. Unknown etiology. Patient has underlying cognitive impairment per colleague discussion with wife. -CT head negative for hematoma.  -Delirium Precautions  -Appears to have resolved. -Follow up with Hospice Care  Hypothyroidism -Low TSH. -Decreased Synthroid dose to 137 mcg daily but will stop now that patient is going on Comfort Care   Severe Malnutrition in the Context of Chronic illness -Nutritionist consulted and appreciate Recc's -C/w Ensure Enlive podaily and 30 ml Prostat BID -C/w MVI daily -D/C Home with Hospice  Leukocytosis, improved  -Was intially though to be from IV Steroid Demargination -Continue to Monitor for S/Sx of Infection.  -Patient was Augmentin for Aspiration and completed course but will put on Cefepime and Vancomycin given CT Chest Findings.  -Will not repeat blood work in AM as goals are changing toward [place] and will D/C Home with Hospice  Hypokalemia -K+ was 3.6  -Replete yesterday -Will not check blood work in AM as goals are changing toward [place] and will D/C Home with Hospice  Discharge Instructions  Discharge Instructions    Call MD for:  difficulty breathing, headache or visual disturbances   Complete by:  As directed    Call MD for:  extreme fatigue   Complete by:  As directed    Call MD for:  hives   Complete by:  As directed    Call MD for:  persistant dizziness or light-headedness   Complete by:  As directed    Call MD for:  persistant nausea and vomiting   Complete by:  As directed    Call MD for:  redness, tenderness, or signs of infection (pain, swelling, redness, odor or green/yellow discharge around incision site)   Complete by:  As directed    Call MD for:  severe uncontrolled pain   Complete by:  As  directed    Call MD for:  temperature >100.4   Complete by:  As directed    Diet - low sodium heart healthy   Complete by:  As directed    Discharge instructions   Complete by:  As directed    Follow up per Hospice Protocol   Increase activity slowly   Complete by:  As directed      Allergies as of 02/21/2017      Reactions   Morphine And Related    hallucinations      Medication List    STOP taking these medications   amLODipine 5 MG tablet Commonly known as:  NORVASC   aspirin 81 MG tablet   benazepril 40 MG tablet Commonly known as:  LOTENSIN   levothyroxine 150 MCG tablet Commonly known as:  SYNTHROID, LEVOTHROID   metFORMIN 500 MG tablet Commonly known as:  GLUCOPHAGE   metoprolol tartrate 100 MG tablet Commonly known as:  LOPRESSOR   rosuvastatin 40 MG tablet Commonly known as:  CRESTOR     TAKE these medications   acetaminophen 500 MG tablet Commonly known as:  TYLENOL Take 1,000 mg by mouth every 6 (six) hours as needed for mild pain or moderate pain.   arformoterol 15 MCG/2ML Nebu Commonly known as:  BROVANA Take 2 mLs (15 mcg total) by nebulization 2 (two) times daily.   feeding supplement (ENSURE ENLIVE) Liqd Take 237 mLs by mouth 2 (two) times daily between meals. Start taking on:  02/22/2017   furosemide 40 MG tablet Commonly known as:  LASIX Take 1 tablet (40 mg total) by mouth 2 (two) times daily.   LORazepam 0.5 MG tablet Commonly known as:  ATIVAN Take 1 tablet (0.5 mg total) by mouth every 8 (eight) hours as needed for anxiety.   ondansetron 4 MG tablet Commonly known as:  ZOFRAN Take 1 tablet (4 mg total) by mouth every 8 (eight) hours as needed for nausea or vomiting.   oxyCODONE 5 MG immediate release tablet Commonly known as:  ROXICODONE Take 1 tablet (5 mg total) by mouth every 6 (six) hours as needed for severe pain.   Vitamin D 2000 units Caps Take 1 capsule by mouth daily.            Durable Medical Equipment   (From admission, onward)        Start     Ordered   02/21/17 1502  DME Oxygen  Once    Question Answer Comment  Mode or (Route) Nasal cannula   Frequency Continuous (stationary and portable oxygen unit needed)   Oxygen conserving device No   Oxygen delivery system Gas      02/21/17 1502     Follow-up Information    Call  Eustaquio Maize, MD.   Specialty:  Pediatrics Why:  As needed Contact information: [place] [address] [phone]          Allergies  Allergen Reactions  . Morphine And Related     hallucinations   Consultations:  Cardiology  PCCM/Pulmonary  Palliative Care Medicine   Procedures/Studies: Dg Chest 2 View  Result Date: 02/10/2017 CLINICAL DATA:  Shortness of breath EXAM: CHEST  2 VIEW COMPARISON:  05/27/2013 FINDINGS: Minimal enlargement of cardiac silhouette post CABG. Atherosclerotic calcification aorta. Mediastinal contours and pulmonary vascularity normal. Emphysematous and bronchitic changes consistent with COPD. No acute infiltrate, pleural effusion or pneumothorax. Osseous demineralization with mild degenerative disc disease changes of the thoracic spine. IMPRESSION: Mild enlargement of cardiac silhouette post CABG. COPD changes without acute infiltrate. Electronically Signed   By: Lavonia Dana M.D.   On: 02/10/2017 12:28   Ct Head Wo Contrast  Result Date: 02/12/2017 CLINICAL DATA:  Altered mental status. EXAM: CT HEAD WITHOUT CONTRAST TECHNIQUE: Contiguous axial images were obtained from the base of the skull through the vertex without intravenous contrast. COMPARISON:  CT head without contrast 01/30/2016 FINDINGS: Brain: Moderate atrophy and white matter changes are similar to the prior exam. No acute infarct, hemorrhage, or mass lesion is present. Ventricles are proportionate to the degree of atrophy. Basal ganglia are intact. Insular ribbon is normal bilaterally. Brainstem and cerebellum are stable. Remote lacunar infarcts  are present in the cerebellum. Vascular: Vascular calcifications are present in the left vertebral artery and bilateral cavernous internal carotid arteries. There is no hyperdense vessel. Skull: Calvarium is intact. No focal lytic or blastic lesions are present. Sinuses/Orbits: The paranasal sinuses and mastoid air cells are clear. Bilateral lens replacements are present. Globes and orbits are within normal limits. IMPRESSION: 1. Stable moderate atrophy and white matter disease. This likely reflects the sequela of chronic microvascular ischemia. 2. No acute intracranial abnormality or significant interval change. Electronically Signed   By: San Morelle M.D.   On: 02/12/2017 11:55  Ct Chest Wo Contrast  Result Date: 02/19/2017 CLINICAL DATA:  Hypoxia.  History of COPD. EXAM: CT CHEST WITHOUT CONTRAST TECHNIQUE: Multidetector CT imaging of the chest was performed following the standard protocol without IV contrast. COMPARISON:  02/10/2017 FINDINGS: Cardiovascular: Enlarged cardiac silhouette. Heavy calcific atherosclerotic disease of the aorta and coronary arteries. Normal caliber of the thoracic aorta. Postsurgical changes from CABG. Mediastinum/Nodes: No enlarged mediastinal or axillary lymph nodes. Thyroid gland, trachea, and esophagus demonstrate no significant findings. Lungs/Pleura: Moderate to advanced paraseptal and centrilobular emphysema with large bulla formation in the posterior aspects of the bilateral lower lobes. Mild bronchiectasis with bronchial wall thickening. The previously mentioned 8 mm nodule in lingula measures 10 mm, which may be due to measurement techniques. There has been interval development of numerous miliary few mm nodules throughout both lungs. A cluster of such nodules measuring up to 5 mm could be seen in the right middle lobe, image 92/153, sequence 4. There has also been interval development of small bilateral pleural effusions with subsegmental atelectasis or  airspace consolidation. Upper Abdomen: Benign-appearing right renal cyst. Musculoskeletal: No chest wall mass or suspicious bone lesions identified. IMPRESSION: Enlarged cardiac silhouette. Heavy calcific atherosclerotic disease of the coronary arteries. Advanced emphysematous changes with large bulla formation in the posterior lower lobes. Bronchiectasis with chronic bronchitic changes. Interval development of numerous miliary soft tissue and ground-glass opacities throughout both lungs. The rapid development of these nodules suggests infectious/inflammatory causes. Interval development of small pleural effusions with lower lobe atelectasis versus confluent airspace consolidation. Dominant 10 mm lingular spiculated nodule is suspicious for malignancy. Consider one of the following in 3 months for both low-risk and high-risk individuals: (a) repeat chest CT, (b) follow-up PET-CT, or (c) tissue sampling. This recommendation follows the consensus statement: Guidelines for Management of Incidental Pulmonary Nodules Detected on CT Images: From the Fleischner Society 2017; Radiology 2017; 284:228-243. Aortic Atherosclerosis (ICD10-I70.0) and Emphysema (ICD10-J43.9). Electronically Signed   By: Fidela Salisbury M.D.   On: 02/19/2017 18:56   Dg Chest Port 1 View  Result Date: 02/20/2017 CLINICAL DATA:  Shortness of breath EXAM: PORTABLE CHEST 1 VIEW COMPARISON:  02/19/2017; 02/13/2017; 02/10/2017; 05/27/2013; chest CT-02/19/2017; 02/10/2017 FINDINGS: Grossly unchanged cardiac silhouette and mediastinal contours post median sternotomy. Atherosclerotic plaque within a tortuous thoracic aorta. The lungs appear hyperexpanded with flattening of the diaphragms and mild diffuse slightly nodular thickening of the pulmonary interstitium. No focal airspace opacities. No definite pleural effusion, though note, the right costophrenic angle is excluded from view. No pneumothorax. No evidence of edema. No acute osseus  abnormalities. IMPRESSION: Similar findings of lung hyperexpansion and chronic bronchitic change without superimposed acute cardiopulmonary disease. Electronically Signed   By: Sandi Mariscal M.D.   On: 02/20/2017 08:55   Dg Chest Port 1 View  Result Date: 02/19/2017 CLINICAL DATA:  84 year old male with shortness of breath. EXAM: PORTABLE CHEST 1 VIEW COMPARISON:  Chest x-ray 02/18/2017. FINDINGS: Patchy areas of septal thickening and peribronchial cuffing noted throughout the lungs bilaterally, most evident in the mid to lower lungs, similar to the prior examination. No confluent consolidative airspace disease. No pleural effusions. No evidence of pulmonary edema. Heart size is normal. Upper mediastinal contours are within normal limits. Aortic atherosclerosis. Status post median sternotomy. IMPRESSION: 1. Minimal residual interstitial pulmonary edema again noted throughout the mid to lower lungs bilaterally. 2. Aortic atherosclerosis. Electronically Signed   By: Vinnie Langton M.D.   On: 02/19/2017 09:24   Dg Chest Port 1 View  Result Date: 02/18/2017 CLINICAL  DATA:  84 year old male admitted with chest pain and shortness of breath. Acute on chronic systolic heart failure, NSTEMI. EXAM: PORTABLE CHEST 1 VIEW COMPARISON:  02/17/2017 and earlier, including chest and abdomen CTA 02/10/2017 FINDINGS: Portable AP semi upright view at 0642 hours. Mediastinal contours are stable and normal aside from mild tortuosity of the thoracic aorta. Prior CABG Visualized tracheal air column is within normal limits. Regressed bilateral pulmonary interstitial opacity and bilateral pleural effusions with improved lung base ventilation. Superimposed attenuation of perihilar and right upper lobe bronchovascular markings corresponding to emphysema seen on recent chest CTA. No new pulmonary opacity.  No pneumothorax. IMPRESSION: 1. Improved ventilation with regression of bilateral pulmonary edema and pleural effusions since  02/17/2017. 2. Underlying emphysema. Electronically Signed   By: Genevie Ann M.D.   On: 02/18/2017 07:47   Dg Chest Port 1 View  Result Date: 02/17/2017 CLINICAL DATA:  Congestive heart failure with worsened shortness of breath. EXAM: PORTABLE CHEST 1 VIEW COMPARISON:  02/16/2017.  02/13/2017. FINDINGS: Persistent interstitial edema in the lower lungs with small effusions and basilar volume loss. Upper lungs largely clear. Similar appearance to the previous exam. This may be slightly worsened radiographically. IMPRESSION: Similar appearance with interstitial edema and volume loss in the lower lungs. Possibly slightly worsened. Electronically Signed   By: Nelson Chimes M.D.   On: 02/17/2017 10:51   Dg Chest Port 1 View  Result Date: 02/16/2017 CLINICAL DATA:  Shortness of Breath EXAM: PORTABLE CHEST 1 VIEW COMPARISON:  February 13, 2017 FINDINGS: Small pleural effusions with bibasilar atelectasis remain. There is no consolidation. Heart is enlarged with pulmonary vascularity within normal limits. No adenopathy. There is aortic atherosclerosis. Patient is status post coronary artery bypass grafting. No bone lesions evident. IMPRESSION: Small pleural effusions and bibasilar atelectasis, stable. Stable cardiomegaly. No new opacity. No change in cardiac silhouette. There is aortic atherosclerosis. Aortic Atherosclerosis (ICD10-I70.0). Electronically Signed   By: Lowella Grip III M.D.   On: 02/16/2017 09:26   Dg Chest Port 1 View  Result Date: 02/13/2017 CLINICAL DATA:  Dyspnea EXAM: PORTABLE CHEST 1 VIEW COMPARISON:  02/10/2017 chest radiograph. FINDINGS: Stable configuration of sternotomy wires and CABG clips. Stable cardiomediastinal silhouette with normal heart size. No pneumothorax. New small bilateral pleural effusions. No pulmonary edema. Hazy bibasilar lung opacities. IMPRESSION: 1. New small bilateral pleural effusions. 2. Hazy bibasilar lung opacities, favor atelectasis, cannot exclude aspiration or  developing pneumonia. Electronically Signed   By: Ilona Sorrel M.D.   On: 02/13/2017 01:40   Ct Angio Chest/abd/pel For Dissection W And/or Wo Contrast  Result Date: 02/10/2017 CLINICAL DATA:  85 year old male with chest abdominal pain with shortness of breath for 3 days. History of abdominal aortic aneurysm repair. EXAM: CT ANGIOGRAPHY CHEST, ABDOMEN AND PELVIS TECHNIQUE: Multidetector CT imaging through the chest, abdomen and pelvis was performed using the standard protocol during bolus administration of intravenous contrast. Multiplanar reconstructed images and MIPs were obtained and reviewed to evaluate the vascular anatomy. CONTRAST:  123m ISOVUE-370 IOPAMIDOL (ISOVUE-370) INJECTION 76% COMPARISON:  02/10/2017 and prior chest radiographs. 10/13/2011 abdominal/pelvic CT. FINDINGS: CTA CHEST FINDINGS Cardiovascular: Upper limits normal heart size, coronary artery calcifications and CABG changes noted. Thoracic aortic atherosclerotic plaque/calcifications noted without aneurysm or dissection. No pericardial effusion or large/central pulmonary emboli. Mediastinum/Nodes: No enlarged mediastinal, hilar, or axillary lymph nodes. Thyroid gland, trachea, and esophagus demonstrate no significant findings. Lungs/Pleura: An 8 mm nodular opacity within the lingula (7:103) is now identified. No other nodule or mass noted. Trace bilateral pleural  effusions and minimal bibasilar atelectasis noted No airspace disease, consolidation or pneumothorax noted Musculoskeletal: No acute abnormality or suspicious bony lesion. Review of the MIP images confirms the above findings. CTA ABDOMEN AND PELVIS FINDINGS VASCULAR The abdominal aorta/graft is unchanged with the aorta again measuring 3.7 cm in greatest diameter. Aortic atherosclerotic plaque/calcifications again noted. There is no evidence of aortic dissection. No change in atherosclerotic calcification within the major branch vessels. 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 Right common iliac scratch de bilateral common iliac artery aneurysms are unchanged measuring 1.6 cm on the right and 1.8 cm on the left. Review of the MIP images confirms the above findings. NON-VASCULAR Hepatobiliary: Probable hepatic steatosis again noted. Cholelithiasis identified without CT evidence of acute cholecystitis. No biliary dilatation. Pancreas: Unremarkable Spleen: Unremarkable Adrenals/Urinary Tract: An 8 mm calculus at the left UVJ/bladder noted. An 8 mm bladder calculus within the right dependent aspect noted. There is no evidence of hydronephrosis. Bilateral renal cysts are present. The adrenal glands are unremarkable and unchanged. Stomach/Bowel: No evidence of bowel obstruction, definite bowel wall thickening or bowel inflammatory changes. Colonic diverticulosis noted without evidence of diverticulitis. Lymphatic: No enlarged lymph nodes. Reproductive: Prostate enlargement again noted Other: No ascites, abscess or pneumoperitoneum. Musculoskeletal: No acute abnormality. Degenerative changes within the lumbar spine again noted. Review of the MIP images confirms the above findings. IMPRESSION: 1. No evidence of aortic dissection or central/large pulmonary emboli. 2. Aortic atherosclerotic calcifications with abdominal aortic aneurysm repair, unchanged appearance of abdominal aorta and unchanged bilateral common iliac artery aneurysms. 3. 8 mm lingular nodule. Consider one of the following in 3 months for both low-risk and high-risk individuals: (a) repeat chest CT, (b) follow-up PET-CT, or (c) tissue sampling. This recommendation follows the consensus statement: Guidelines for Management of Incidental Pulmonary Nodules Detected on CT Images: From the Fleischner Society 2017; Radiology 2017; 284:228-243. 4. 8 mm left UVJ versus left bladder calculus. No evidence of hydronephrosis. 8 mm right bladder calculus. 5. Trace bilateral pleural effusions and mild bibasilar  atelectasis 6. Probable hepatic steatosis. 7. Cholelithiasis without CT evidence of acute cholecystitis 8. Aortic Atherosclerosis (ICD10-I70.0) and Emphysema (ICD10-J43.9). Electronically Signed   By: Margarette Canada M.D.   On: 02/10/2017 13:58   ECHOCARDIOGRAM ------------------------------------------------------------------- Study Conclusions  - Left ventricle: The cavity size was normal. Wall thickness was   normal. Systolic function was moderately reduced. The estimated   ejection fraction was in the range of 35% to 40%. Diffuse   hypokinesis. - Mitral valve: There was mild to moderate regurgitation directed   centrally. - Left atrium: The atrium was mildly dilated. - Right ventricle: The cavity size was mildly dilated. Systolic   function was moderately reduced. - Right atrium: The atrium was mildly dilated. - Tricuspid valve: There was moderate regurgitation. - Pulmonary arteries: Systolic pressure was mildly increased. PA   peak pressure: 40 mm Hg (S).  Subjective: Seen and examined and wanted to go home. States he became SOB overnight but has improved slightly. No other complaints.  Discharge Exam: Vitals:   02/21/17 0844 02/21/17 1125  BP: 120/67 (!) 105/59  Pulse:  80  Resp:  (!) 27  Temp:    SpO2:  (!) 83%   Vitals:   02/20/17 1942 02/20/17 1943 02/21/17 0844 02/21/17 1125  BP:   120/67 (!) 105/59  Pulse:    80  Resp:    (!) 27  Temp:      TempSrc:  SpO2: 96% 96%  (!) 83%  Weight:      Height:       General: Pt is a thin cachectic appearing Caucasian male who is alert, awake Cardiovascular: Tachycardic rate, S1/S2 +, no rubs, no gallops Respiratory: Diminished bilaterally, no wheezing, no rhonchi Abdominal: Soft, NT, ND, bowel sounds + Extremities: Trace edema, no cyanosis  The results of significant diagnostics from this hospitalization (including imaging, microbiology, ancillary and laboratory) are listed below for reference.    Microbiology: Recent  Results (from the past 240 hour(s))  MRSA PCR Screening     Status: None   Collection Time: 02/14/17  7:49 AM  Result Value Ref Range Status   MRSA by PCR NEGATIVE NEGATIVE Final    Comment:        The GeneXpert MRSA Assay (FDA approved for NASAL specimens only), is one component of a comprehensive MRSA colonization surveillance program. It is not intended to diagnose MRSA infection nor to guide or monitor treatment for MRSA infections.     Labs: BNP (last 3 results) No results for input(s): BNP in the last 8760 hours. Basic Metabolic Panel: Recent Labs  Lab 02/16/17 0608 02/17/17 0606 02/18/17 0338 02/19/17 0345 02/20/17 0239  NA 137 139 137 135 137  K 3.5 3.3* 3.2* 3.1* 3.6  CL 99* 94* 90* 88* 86*  CO2 26 32 33* 33* 33*  GLUCOSE 184* 131* 156* 146* 168*  BUN 27* 23* 28* 33* 43*  CREATININE 1.09 1.21 1.27* 1.24 1.50*  CALCIUM 9.2 8.9 8.9 9.0 9.3  MG 2.2 2.1 2.0 1.9 2.0  PHOS 3.2 3.1 3.1 2.9 3.4   Liver Function Tests: Recent Labs  Lab 02/16/17 0608 02/17/17 0606 02/18/17 0338 02/19/17 0345 02/20/17 0239  AST '28 25 27 30 '$ 40  ALT '22 20 20 20 23  '$ ALKPHOS 74 76 80 83 94  BILITOT 1.0 1.1 1.3* 1.3* 1.3*  PROT 6.7 6.3* 6.9 7.1 7.4  ALBUMIN 2.8* 2.6* 2.7* 2.7* 2.8*   No results for input(s): LIPASE, AMYLASE in the last 168 hours. No results for input(s): AMMONIA in the last 168 hours. CBC: Recent Labs  Lab 02/16/17 0608 02/17/17 0606 02/17/17 1159 02/18/17 0338 02/19/17 0345 02/20/17 0239  WBC 11.0* 11.4* 13.3* 10.1 10.3 9.0  NEUTROABS 9.4* 10.5*  --  8.8* 9.7* 7.8*  HGB 8.4* 8.7* 9.0* 9.0* 9.4* 10.3*  HCT 26.0* 26.4* 27.5* 27.7* 28.8* 32.9*  MCV 95.9 96.0 96.2 96.5 95.4 95.4  PLT 62* 62* 59* 57* 54* 61*   Cardiac Enzymes: No results for input(s): CKTOTAL, CKMB, CKMBINDEX, TROPONINI in the last 168 hours. BNP: Invalid input(s): POCBNP CBG: Recent Labs  Lab 02/19/17 1154 02/19/17 1659 02/19/17 2103 02/20/17 0922 02/20/17 1220  GLUCAP 177*  207* 252* 185* 169*   D-Dimer No results for input(s): DDIMER in the last 72 hours. Hgb A1c No results for input(s): HGBA1C in the last 72 hours. Lipid Profile No results for input(s): CHOL, HDL, LDLCALC, TRIG, CHOLHDL, LDLDIRECT in the last 72 hours. Thyroid function studies No results for input(s): TSH, T4TOTAL, T3FREE, THYROIDAB in the last 72 hours.  Invalid input(s): FREET3 Anemia work up No results for input(s): VITAMINB12, FOLATE, FERRITIN, TIBC, IRON, RETICCTPCT in the last 72 hours. Urinalysis    Component Value Date/Time   COLORURINE YELLOW 02/10/2017 1246   APPEARANCEUR HAZY (A) 02/10/2017 1246   LABSPEC 1.034 (H) 02/10/2017 1246   PHURINE 5.0 02/10/2017 1246   GLUCOSEU NEGATIVE 02/10/2017 1246   HGBUR SMALL (A) 02/10/2017 1246  BILIRUBINUR NEGATIVE 02/10/2017 1246   [place] 02/10/2017 1246   PROTEINUR 100 (A) 02/10/2017 1246   UROBILINOGEN 1.0 09/22/2010 1638   NITRITE NEGATIVE 02/10/2017 1246   LEUKOCYTESUR NEGATIVE 02/10/2017 1246   Sepsis Labs Invalid input(s): PROCALCITONIN,  WBC,  LACTICIDVEN Microbiology Recent Results (from the past 240 hour(s))  MRSA PCR Screening     Status: None   Collection Time: 02/14/17  7:49 AM  Result Value Ref Range Status   MRSA by PCR NEGATIVE NEGATIVE Final    Comment:        The GeneXpert MRSA Assay (FDA approved for NASAL specimens only), is one component of a comprehensive MRSA colonization surveillance program. It is not intended to diagnose MRSA infection nor to guide or monitor treatment for MRSA infections.    Time coordinating discharge: 30 minutes  SIGNED:  Kerney Elbe, DO [facility] 02/21/2017, 3:06 PM Pager [phone]  If 7PM-7AM, please contact night-coverage [email or web address] Password [email or web address]

## 2017-02-21 NOTE — Care Management Note (Addendum)
Case Management Note  Patient Details  Name: MASSON NALEPA MRN: [id number] Date of Birth: 02-Feb-1932  Subjective/Objective:  Pt admitted with SOB - found to have lung nodules            Action/Plan:  PTA from home with wife.  Family/pt decided to go home with hospice.  Family chose [facility] -  Referral given and pt has been accepted.  Agency aware of discharge today - equipment has been ordered and ETA of equipment is 3pm today.  Once delivery of equipment has been confirmed - bedside nurse will contact [facility] for transport home (CM confirmed with [facility] that they transport to zipcode and that they will accept HF 6 liters. Wife will follow ambulance transport to the home in her vehicle. Psychologist, counselling given to bedside nurse  Expected Discharge Date:                  Expected Discharge Plan:  Home w Hospice Care  In-House Referral:     Discharge planning Services  CM Consult  Post Acute Care Choice:    Choice offered to:  Adult Children, Spouse  DME Arranged:    DME Agency:     HH Arranged:    HH Agency:  [facility]  Status of Service:  In process, will continue to follow  If discussed at Long Length of Stay Meetings, dates discussed:    Additional Comments: 50 - family confirmed that all required equipment has been delivered to the home.  CM faxed discharge summary to agency as requested.   Maryclare Labrador, RN 02/21/2017, 2:23 PM

## 2017-02-21 NOTE — Care Management (Signed)
Referral given to Christopher will review faxed information and follow up with daughter Shauna Hugh - agnecy will then follow up with CM.  Agency informed that pt will need hospital bed, oxygen and possibly wheelchair.  Pt will need [facility] transport tome

## 2017-02-21 NOTE — Progress Notes (Signed)
Appreciate palliative care input and plan to transition to comfort.  We will sign off   Erick Colace ACNP-BC [place] Pager # [phone] OR # [phone] if no answer

## 2017-02-25 ENCOUNTER — Telehealth: Payer: Self-pay | Admitting: Pediatrics

## 2017-02-27 NOTE — Telephone Encounter (Signed)
Noted and marked decease in epic

## 2017-03-03 DEATH — deceased

## 2017-03-20 ENCOUNTER — Ambulatory Visit: Payer: Medicare Other | Admitting: Pediatrics

## 2017-07-16 IMAGING — XA IR ANGIO INTRA EXTRACRAN SEL COM CAROTID INNOMINATE BILAT MOD SE
1 series · 11 of 24 positions shown · IV contrast (IODINE)
Comparison: CT angiogram of the head and neck of 01/30/2016.

INDICATION: Vertebrobasilar ischemia.  Abnormal CT angiogram of the brain.

EXAM:
IR ANGIO VERTEBRAL SEL VERTEBRAL BILAT MOD SED; BILATERAL COMMON
CAROTID AND INNOMINATE ANGIOGRAPHY
TECHNIQUE: Informed written consent was obtained from the patient after a
thorough discussion of the procedural risks, benefits and
alternatives. All questions were addressed. Maximal Sterile Barrier
Technique was utilized including caps, mask, sterile gowns, sterile
gloves, sterile drape, hand hygiene and skin antiseptic. A timeout
was performed prior to the initiation of the procedure.

[Series 300: dr. (person_name) · 11 of 307 slices shown]
[im 14/307]
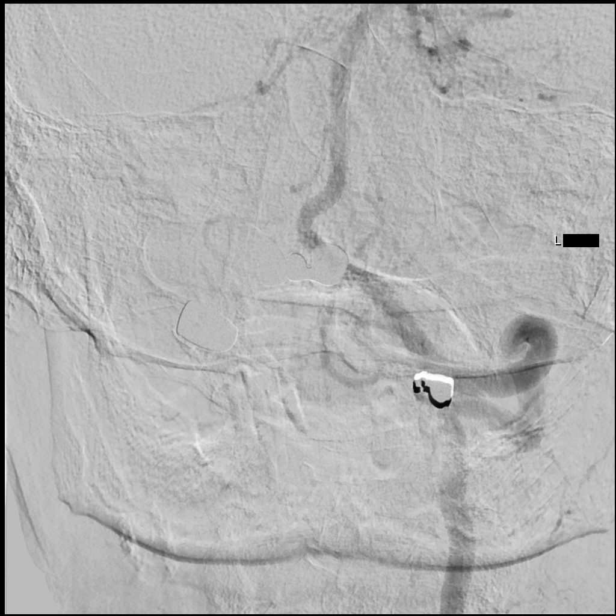
[im 40/307]
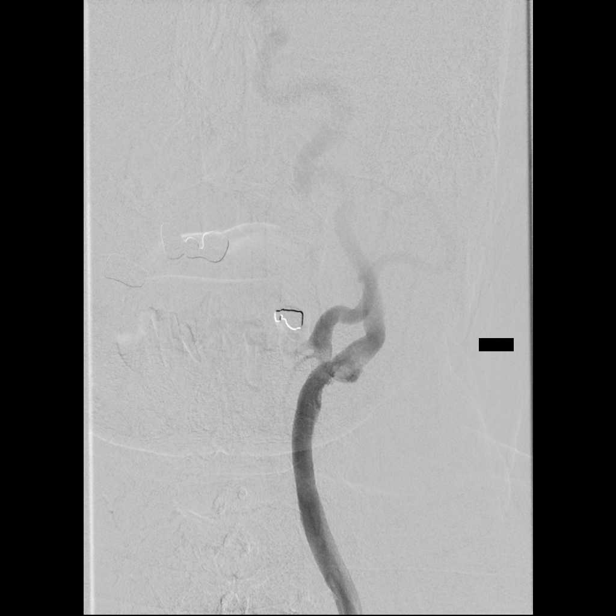
[im 67/307]
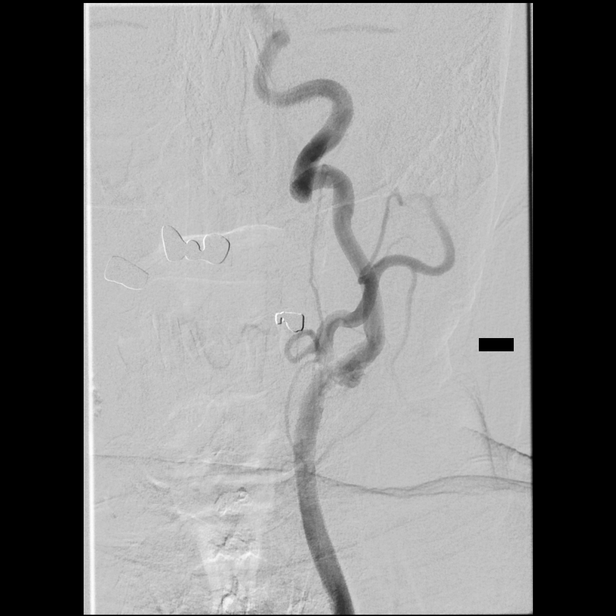
[im 94/307]
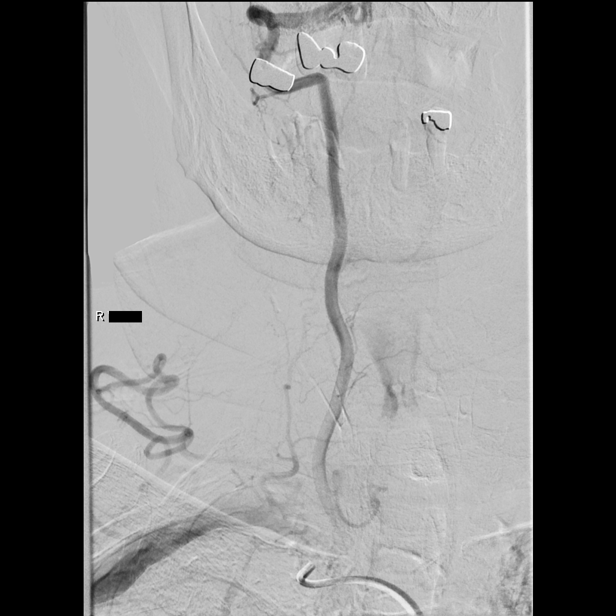
[im 120/307]
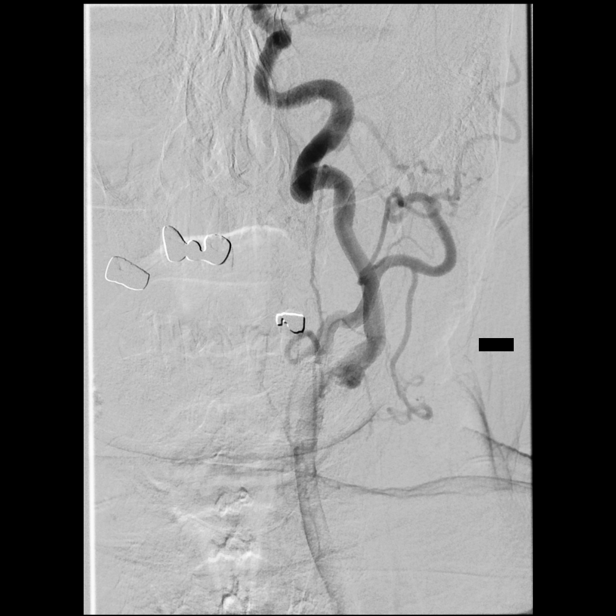
[im 160/307]
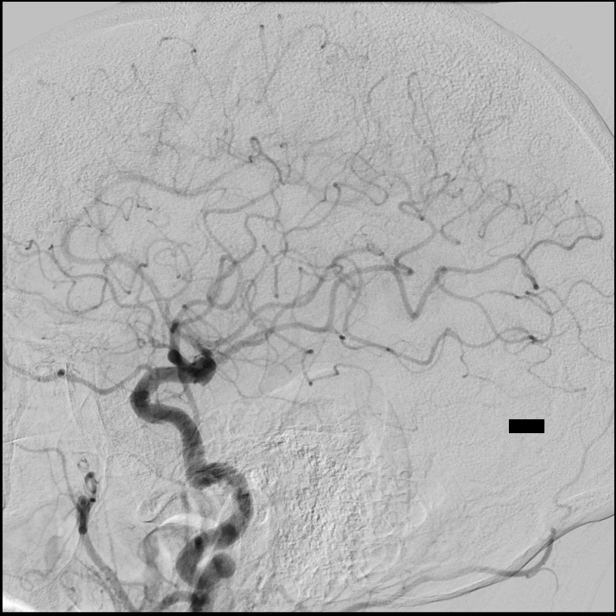
[im 187/307]
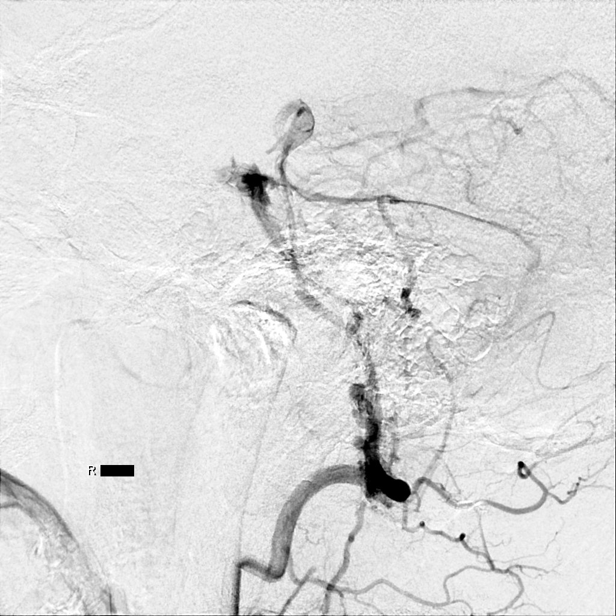
[im 213/307]
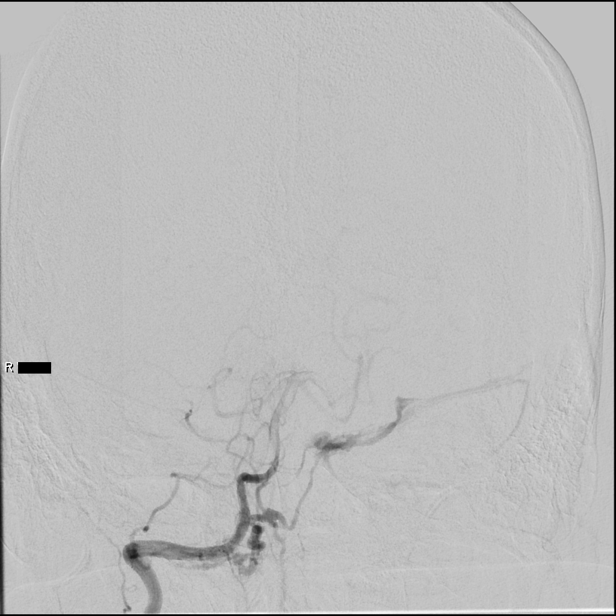
[im 240/307]
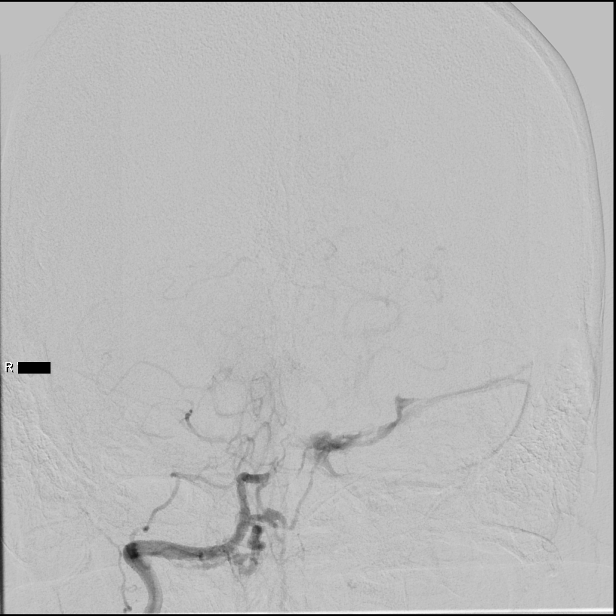
[im 267/307]
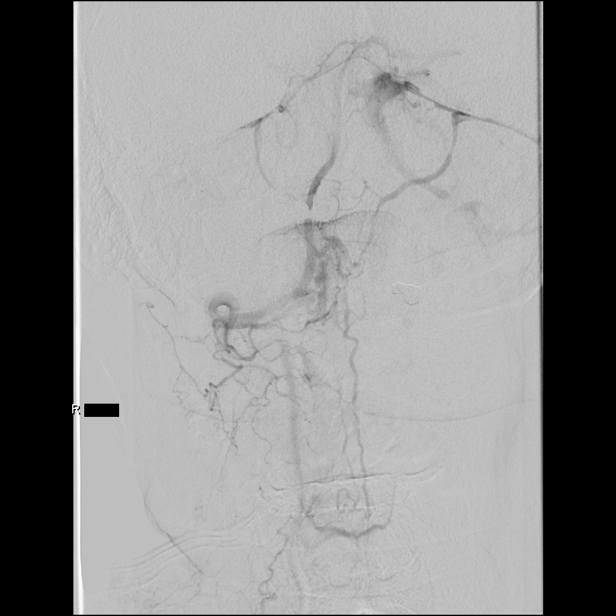
[im 293/307]
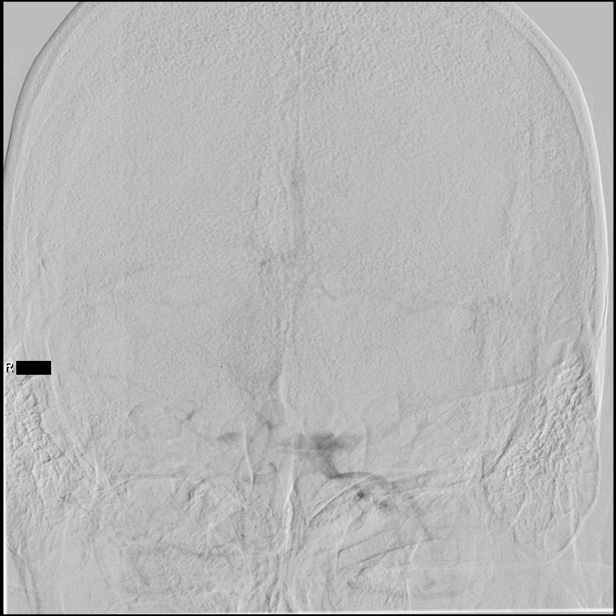

[11 of 24 positions shown; findings below may reference images not displayed]

MEDICATIONS:
None. The antibiotic was administered within 1 hour of the procedure

ANESTHESIA/SEDATION:
Versed 1 mg IV; Fentanyl 25 mcg IV.

Moderate Sedation Time:  40 minutes.

The patient was continuously monitored during the procedure by the
interventional radiology nurse under my direct supervision.

CONTRAST:  Isovue 300 approximately 60

FLUOROSCOPY TIME:  Fluoroscopy Time: 50 minutes 25 seconds (1005
mGy).

COMPLICATIONS:
None immediate.
The right groin was prepped and draped in the usual sterile fashion.
Thereafter using modified Seldinger technique, transfemoral access
into the right common femoral artery was obtained without
difficulty. Over a 0.035 inch guidewire a 5 Eigardas Epelbaum was
inserted. Through this, and also over a 0.035 inch guidewire a 5
French JB 1 catheter was advanced to the aortic arch region and
selectively positioned in the right common carotid artery, the right
vertebral artery, the left common carotid artery and left vertebral
artery.
FINDINGS: The right common carotid arteriogram demonstrates approximately 50%
stenosis of the right external carotid artery at its origin
secondary to a partially calcified circumferential plaque. Its
branches opacify normally.

The right internal carotid artery at the bulb demonstrates an
irregular partially calcified plaque along its posterior medial wall
associated with a small focal ulceration.

There is a stenosis of 20% by the NASCET criteria at the proximal
aspect of the bulb.

Distal to this, the right internal carotid artery is seen to opacify
normally to the mid one-third where there is a U-shaped
configuration without evidence of kinking.

Distal to this the vessel assumes normal caliber to the distal
cervical petrous segment. The petrous, the cavernous and the
supraclinoid segments opacify normally.

A right posterior communicating artery is seen opacifying the right
posterior cerebral artery distribution.

The right middle cerebral artery and the right anterior cerebral
artery opacify normally into the capillary and venous phases.

Hypoplastic left transverse sinus is seen on a developmental basis.

There is a mild stenosis of the right subclavian artery just
proximal to the origin of the right vertebral artery.

This is associated with a circumferential calcified plaque without
intraluminal irregularities.

The origin of the right vertebral artery is normal.

The vessel is seen to opacify normally to the cranial skull base.

Normal opacification is seen at the right vertebrobasilar junction.

The right posterior-inferior cerebellar artery demonstrates a focal
area of stenosis at its origin.

Distal to this the vessel assumes an irregular lobulated appearance
with a venous channel draining into the left superior cerebellar
vein. Another venous channel is seen projecting inferiorly in the
midline and posteriorly. This may represent a posterior spinal
draining vein which extends to the level of the C2 level on the
angiogram provided.

The right vertebrobasilar junction distal to the right
posterior-inferior cerebellar artery appears patent.

The vessel distal to this is seen to opacify into the basilar artery
with non-opacified blood noted in the basilar artery, the posterior
cerebral arteries, the superior cerebellar arteries.

The left common carotid arteriogram demonstrates severe stenosis at
the origin of the left external carotid artery. Its branches,
however, appear to opacify normally.

The left internal carotid artery at the bulb also demonstrates a
calcified plaque associated with approximately 20% stenosis by the
NASCET criteria.

Distal to this the vessel is seen to opacify to the cranial skull
base with mild to moderate tortuosity in its mid cervical portion.

The distal cervical, the petrous, the cavernous and the supraclinoid
segments are widely patent.

The left middle cerebral artery and the left anterior cerebral
artery opacify normally into the capillary and venous phases.

Again a prominent vein of Labbe is seen projecting into the distal
left transverse sinus and subsequently into the left sigmoid sinus.

The origin of the left vertebral artery demonstrates approximately
50% stenosis. The vessel is otherwise seen to opacify to the cranial
skull base.

Normal opacification is seen of the left posterior inferior
cerebellar artery and the left vertebrobasilar junction.

The opacified portions of the basilar artery, the left posterior
cerebral artery, the superior cerebellar arteries and the
anterior-inferior cerebellar arteries is grossly normal into the
delayed arterial and venous phases.
IMPRESSION: Abnormal appearance of the right posterior-inferior cerebellar
artery with a linear radiolucency in its proximal one-third
associated with lobulated fusiform dilatation of the middle
one-third leading to early venous drainage via a superior cerebellar
vein on the right, and also inferiorly a midline draining vein
posteriorly in the spinal canal probably representing a posterior
spinal vein.

These findings suggest angiographically the presence of a dural
arteriovenous fistula being supplied primarily by the right
posterior-inferior cerebellar artery possibly related to a
dissection with lobulated fusiform appearance.

Approximately 50% stenosis at the origin of the left vertebral
artery secondary to a partially calcified plaque.

Approximately 20% stenosis at the origin of the internal carotid
arteries at the bulb bilaterally secondary to partially calcified
plaque. High-grade stenosis at the origin of the left external
carotid artery.
